# Patient Record
Sex: Female | Born: 1965
Health system: Southern US, Community
[De-identification: ages and names within clinical notes are randomized; demographics above are authoritative.]

## PROBLEM LIST (undated history)

## (undated) DIAGNOSIS — F32A Depression, unspecified: Secondary | ICD-10-CM

## (undated) DIAGNOSIS — E785 Hyperlipidemia, unspecified: Secondary | ICD-10-CM

## (undated) DIAGNOSIS — F329 Major depressive disorder, single episode, unspecified: Secondary | ICD-10-CM

## (undated) DIAGNOSIS — N979 Female infertility, unspecified: Secondary | ICD-10-CM

## (undated) DIAGNOSIS — T7840XA Allergy, unspecified, initial encounter: Secondary | ICD-10-CM

## (undated) DIAGNOSIS — K219 Gastro-esophageal reflux disease without esophagitis: Secondary | ICD-10-CM

## (undated) DIAGNOSIS — F4310 Post-traumatic stress disorder, unspecified: Secondary | ICD-10-CM

## (undated) DIAGNOSIS — G4730 Sleep apnea, unspecified: Secondary | ICD-10-CM

## (undated) DIAGNOSIS — K2970 Gastritis, unspecified, without bleeding: Secondary | ICD-10-CM

## (undated) DIAGNOSIS — T8859XA Other complications of anesthesia, initial encounter: Secondary | ICD-10-CM

## (undated) DIAGNOSIS — E230 Hypopituitarism: Secondary | ICD-10-CM

## (undated) DIAGNOSIS — Z1379 Encounter for other screening for genetic and chromosomal anomalies: Principal | ICD-10-CM

## (undated) DIAGNOSIS — R87619 Unspecified abnormal cytological findings in specimens from cervix uteri: Secondary | ICD-10-CM

## (undated) DIAGNOSIS — E039 Hypothyroidism, unspecified: Secondary | ICD-10-CM

## (undated) DIAGNOSIS — R011 Cardiac murmur, unspecified: Secondary | ICD-10-CM

## (undated) DIAGNOSIS — E162 Hypoglycemia, unspecified: Secondary | ICD-10-CM

## (undated) DIAGNOSIS — F419 Anxiety disorder, unspecified: Secondary | ICD-10-CM

## (undated) DIAGNOSIS — N912 Amenorrhea, unspecified: Secondary | ICD-10-CM

## (undated) DIAGNOSIS — M79606 Pain in leg, unspecified: Secondary | ICD-10-CM

## (undated) DIAGNOSIS — I10 Essential (primary) hypertension: Secondary | ICD-10-CM

## (undated) DIAGNOSIS — Z8489 Family history of other specified conditions: Secondary | ICD-10-CM

## (undated) DIAGNOSIS — T4145XA Adverse effect of unspecified anesthetic, initial encounter: Secondary | ICD-10-CM

## (undated) DIAGNOSIS — B019 Varicella without complication: Secondary | ICD-10-CM

## (undated) HISTORY — DX: Allergy, unspecified, initial encounter: T78.40XA

## (undated) HISTORY — PX: UPPER GASTROINTESTINAL ENDOSCOPY: SHX188

## (undated) HISTORY — PX: OTHER SURGICAL HISTORY: SHX169

## (undated) HISTORY — DX: Hypopituitarism: E23.0

## (undated) HISTORY — DX: Female infertility, unspecified: N97.9

## (undated) HISTORY — DX: Varicella without complication: B01.9

## (undated) HISTORY — DX: Anxiety disorder, unspecified: F41.9

## (undated) HISTORY — DX: Encounter for other screening for genetic and chromosomal anomalies: Z13.79

## (undated) HISTORY — DX: Major depressive disorder, single episode, unspecified: F32.9

## (undated) HISTORY — DX: Gastritis, unspecified, without bleeding: K29.70

## (undated) HISTORY — PX: EYE SURGERY: SHX253

## (undated) HISTORY — DX: Amenorrhea, unspecified: N91.2

## (undated) HISTORY — DX: Post-traumatic stress disorder, unspecified: F43.10

## (undated) HISTORY — DX: Depression, unspecified: F32.A

## (undated) HISTORY — DX: Unspecified abnormal cytological findings in specimens from cervix uteri: R87.619

## (undated) HISTORY — DX: Cardiac murmur, unspecified: R01.1

---

## 1994-05-10 HISTORY — PX: REDUCTION MAMMAPLASTY: SUR839

## 1994-05-10 HISTORY — PX: BREAST REDUCTION SURGERY: SHX8

## 1995-05-11 HISTORY — PX: CHOLECYSTECTOMY: SHX55

## 2011-05-11 HISTORY — PX: ANTERIOR CERVICAL DISCECTOMY: SHX1160

## 2012-04-09 LAB — HM PAP SMEAR

## 2012-09-03 ENCOUNTER — Emergency Department (HOSPITAL_COMMUNITY)
Admission: EM | Admit: 2012-09-03 | Discharge: 2012-09-03 | Disposition: A | Discharge status: Home / Self Care | Payer: No Typology Code available for payment source | Attending: Emergency Medicine | Admitting: Emergency Medicine

## 2012-09-03 ENCOUNTER — Emergency Department (HOSPITAL_COMMUNITY): Payer: No Typology Code available for payment source

## 2012-09-03 ENCOUNTER — Encounter (HOSPITAL_COMMUNITY): Payer: Self-pay | Admitting: Emergency Medicine

## 2012-09-03 DIAGNOSIS — I10 Essential (primary) hypertension: Secondary | ICD-10-CM | POA: Insufficient documentation

## 2012-09-03 DIAGNOSIS — S99919A Unspecified injury of unspecified ankle, initial encounter: Secondary | ICD-10-CM | POA: Insufficient documentation

## 2012-09-03 DIAGNOSIS — Z862 Personal history of diseases of the blood and blood-forming organs and certain disorders involving the immune mechanism: Secondary | ICD-10-CM | POA: Insufficient documentation

## 2012-09-03 DIAGNOSIS — Z8639 Personal history of other endocrine, nutritional and metabolic disease: Secondary | ICD-10-CM | POA: Insufficient documentation

## 2012-09-03 DIAGNOSIS — S8990XA Unspecified injury of unspecified lower leg, initial encounter: Secondary | ICD-10-CM | POA: Insufficient documentation

## 2012-09-03 DIAGNOSIS — Z79899 Other long term (current) drug therapy: Secondary | ICD-10-CM | POA: Insufficient documentation

## 2012-09-03 DIAGNOSIS — E785 Hyperlipidemia, unspecified: Secondary | ICD-10-CM | POA: Insufficient documentation

## 2012-09-03 DIAGNOSIS — Y92410 Unspecified street and highway as the place of occurrence of the external cause: Secondary | ICD-10-CM | POA: Insufficient documentation

## 2012-09-03 DIAGNOSIS — Z880 Allergy status to penicillin: Secondary | ICD-10-CM | POA: Insufficient documentation

## 2012-09-03 DIAGNOSIS — Z8739 Personal history of other diseases of the musculoskeletal system and connective tissue: Secondary | ICD-10-CM | POA: Insufficient documentation

## 2012-09-03 DIAGNOSIS — Y9389 Activity, other specified: Secondary | ICD-10-CM | POA: Insufficient documentation

## 2012-09-03 HISTORY — DX: Hyperlipidemia, unspecified: E78.5

## 2012-09-03 HISTORY — DX: Essential (primary) hypertension: I10

## 2012-09-03 HISTORY — DX: Hypoglycemia, unspecified: E16.2

## 2012-09-03 HISTORY — DX: Pain in leg, unspecified: M79.606

## 2012-09-03 LAB — URINE MICROSCOPIC-ADD ON

## 2012-09-03 LAB — GLUCOSE, CAPILLARY: Glucose-Capillary: 91 mg/dL (ref 70–99)

## 2012-09-03 LAB — URINALYSIS, ROUTINE W REFLEX MICROSCOPIC
Bilirubin Urine: NEGATIVE
Glucose, UA: NEGATIVE mg/dL
Nitrite: NEGATIVE
Specific Gravity, Urine: 1.018 (ref 1.005–1.030)
pH: 6 (ref 5.0–8.0)

## 2012-09-03 LAB — POCT I-STAT, CHEM 8
BUN: 14 mg/dL (ref 6–23)
Chloride: 103 mEq/L (ref 96–112)
Glucose, Bld: 90 mg/dL (ref 70–99)
HCT: 39 % (ref 36.0–46.0)
Potassium: 3.9 mEq/L (ref 3.5–5.1)

## 2012-09-03 MED ORDER — PERCOCET 5-325 MG PO TABS: 1.0000 | ORAL_TABLET | Freq: Four times a day (QID) | ORAL | Status: DC | PRN

## 2012-09-03 MED ORDER — HYDROMORPHONE HCL PF 2 MG/ML IJ SOLN
2.0000 mg | Freq: Once | INTRAMUSCULAR | Status: AC
  Administered 2012-09-03: 2 mg via INTRAMUSCULAR
  Filled 2012-09-03: qty 1

## 2012-09-03 MED ORDER — OXYCODONE-ACETAMINOPHEN 5-325 MG PO TABS
1.0000 | ORAL_TABLET | Freq: Once | ORAL | Status: AC
  Administered 2012-09-03: 1 via ORAL
  Filled 2012-09-03: qty 1

## 2012-09-03 MED ORDER — DIAZEPAM 5 MG PO TABS: 5.0000 mg | ORAL_TABLET | Freq: Four times a day (QID) | ORAL | Status: DC | PRN

## 2012-09-03 MED ORDER — IBUPROFEN 800 MG PO TABS
800.0000 mg | ORAL_TABLET | Freq: Once | ORAL | Status: AC
  Administered 2012-09-03: 800 mg via ORAL
  Filled 2012-09-03: qty 1

## 2012-09-03 MED ORDER — NAPROXEN 500 MG PO TABS: 500.0000 mg | ORAL_TABLET | Freq: Two times a day (BID) | ORAL | Status: DC

## 2012-09-03 MED ORDER — DIAZEPAM 5 MG PO TABS
5.0000 mg | ORAL_TABLET | Freq: Once | ORAL | Status: AC
  Administered 2012-09-03: 5 mg via ORAL
  Filled 2012-09-03: qty 1
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
## 2012-09-03 NOTE — ED Notes (Signed)
Patient transported to X-ray 

## 2012-09-03 NOTE — ED Notes (Signed)
[id number]<AV> Expected date:<BR> Expected time:<BR> Means of arrival:<BR> Comments:<BR> Hold triage
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
## 2012-09-08 ENCOUNTER — Emergency Department (HOSPITAL_COMMUNITY)
Admission: EM | Admit: 2012-09-08 | Discharge: 2012-09-08 | Disposition: A | Discharge status: Home / Self Care | Payer: 59 | Attending: Emergency Medicine | Admitting: Emergency Medicine

## 2012-09-08 ENCOUNTER — Encounter (HOSPITAL_COMMUNITY): Payer: Self-pay | Admitting: Emergency Medicine

## 2012-09-08 ENCOUNTER — Emergency Department (HOSPITAL_COMMUNITY): Payer: 59

## 2012-09-08 DIAGNOSIS — Z880 Allergy status to penicillin: Secondary | ICD-10-CM | POA: Insufficient documentation

## 2012-09-08 DIAGNOSIS — Y92410 Unspecified street and highway as the place of occurrence of the external cause: Secondary | ICD-10-CM | POA: Insufficient documentation

## 2012-09-08 DIAGNOSIS — E785 Hyperlipidemia, unspecified: Secondary | ICD-10-CM | POA: Insufficient documentation

## 2012-09-08 DIAGNOSIS — S301XXA Contusion of abdominal wall, initial encounter: Secondary | ICD-10-CM | POA: Insufficient documentation

## 2012-09-08 DIAGNOSIS — I10 Essential (primary) hypertension: Secondary | ICD-10-CM | POA: Insufficient documentation

## 2012-09-08 DIAGNOSIS — D649 Anemia, unspecified: Secondary | ICD-10-CM | POA: Insufficient documentation

## 2012-09-08 DIAGNOSIS — Z8639 Personal history of other endocrine, nutritional and metabolic disease: Secondary | ICD-10-CM | POA: Insufficient documentation

## 2012-09-08 DIAGNOSIS — R110 Nausea: Secondary | ICD-10-CM | POA: Insufficient documentation

## 2012-09-08 DIAGNOSIS — Z862 Personal history of diseases of the blood and blood-forming organs and certain disorders involving the immune mechanism: Secondary | ICD-10-CM | POA: Insufficient documentation

## 2012-09-08 DIAGNOSIS — Z79899 Other long term (current) drug therapy: Secondary | ICD-10-CM | POA: Insufficient documentation

## 2012-09-08 DIAGNOSIS — R141 Gas pain: Secondary | ICD-10-CM | POA: Insufficient documentation

## 2012-09-08 DIAGNOSIS — R142 Eructation: Secondary | ICD-10-CM | POA: Insufficient documentation

## 2012-09-08 DIAGNOSIS — Y9389 Activity, other specified: Secondary | ICD-10-CM | POA: Insufficient documentation

## 2012-09-08 LAB — CBC
MCH: 29.4 pg (ref 26.0–34.0)
MCHC: 33.8 g/dL (ref 30.0–36.0)
Platelets: 245 10*3/uL (ref 150–400)
RBC: 3.64 MIL/uL — ABNORMAL LOW (ref 3.87–5.11)

## 2012-09-08 LAB — POCT I-STAT, CHEM 8
Hemoglobin: 10.9 g/dL — ABNORMAL LOW (ref 12.0–15.0)
Sodium: 136 mEq/L (ref 135–145)
TCO2: 27 mmol/L (ref 0–100)

## 2012-09-08 MED ORDER — IOHEXOL 300 MG/ML  SOLN
100.0000 mL | Freq: Once | INTRAMUSCULAR | Status: AC | PRN
  Administered 2012-09-08: 100 mL via INTRAVENOUS
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
## 2012-09-10 NOTE — ED Provider Notes (Signed)
Medical screening examination/treatment/procedure(s) were performed by non-physician practitioner and as supervising physician I was immediately available for consultation/collaboration.   Jil Penland, MD 09/10/12 1542 

## 2013-01-12 ENCOUNTER — Ambulatory Visit: Payer: 59 | Admitting: Internal Medicine

## 2013-04-06 ENCOUNTER — Emergency Department (HOSPITAL_COMMUNITY)
Admission: EM | Admit: 2013-04-06 | Discharge: 2013-04-06 | Disposition: A | Discharge status: Home / Self Care | Payer: 59 | Source: Home / Self Care

## 2013-04-06 ENCOUNTER — Encounter (HOSPITAL_COMMUNITY): Payer: Self-pay | Admitting: Emergency Medicine

## 2013-04-06 DIAGNOSIS — B029 Zoster without complications: Secondary | ICD-10-CM

## 2013-04-06 MED ORDER — VALACYCLOVIR HCL 1 G PO TABS: 1000.0000 mg | ORAL_TABLET | Freq: Three times a day (TID) | ORAL | Status: DC

## 2013-04-06 MED ORDER — LIDOCAINE 5 % EX PTCH: 1.0000 | MEDICATED_PATCH | CUTANEOUS | Status: DC

## 2013-04-06 MED ORDER — HYDROCODONE-ACETAMINOPHEN 5-325 MG PO TABS: 1.0000 | ORAL_TABLET | ORAL | Status: DC | PRN

## 2013-04-06 MED ORDER — METHYLPREDNISOLONE 4 MG PO KIT: PACK | ORAL | Status: DC

## 2013-04-06 NOTE — ED Provider Notes (Signed)
Medical screening examination/treatment/procedure(s) were performed by non-physician practitioner and as supervising physician I was immediately available for consultation/collaboration.  Leslee Home, M.D.  Reuben Likes, MD 04/06/13 2037

## 2013-04-06 NOTE — ED Notes (Signed)
C/o painful rash on back and at bra line.  Onset Tuesday.  No otc meds used for symptoms. Denies fever and any other symptoms.

## 2013-04-06 NOTE — ED Provider Notes (Signed)
CSN: [id number]     Arrival date & time 04/06/13  1240 History   First MD Initiated Contact with Patient 04/06/13 1359     Chief Complaint  Patient presents with  . Herpes Zoster   (Consider location/radiation/quality/duration/timing/severity/associated sxs/prior Treatment) HPI Comments: 47 year old female presents with a complaint of a painful burning rash in the left chest along the mid axillary line as well as a rash just left of the thoracic spine. It began approximately 3 days ago but has been getting worse in terms of discomfort. Denies anterior chest pain, shortness of breath or other systemic symptoms. The rash and skin tenderness is located along the left T7 or T8 dermatome.   Past Medical History  Diagnosis Date  . Hypertension   . Hypoglycemia   . Hyperlipemia   . Leg pain    Past Surgical History  Procedure Laterality Date  . Eye surgery    . Cholecystectomy    . Cesarean section    . Breast reduction surgery    . Anterior cervical discectomy     Family History  Problem Relation Age of Onset  . Osteoarthritis Mother   . Hypertension Father    History  Substance Use Topics  . Smoking status: Never Smoker   . Smokeless tobacco: Not on file  . Alcohol Use: No   OB History   Grav Para Term Preterm Abortions TAB SAB Ect Mult Living                 Review of Systems  Skin:       As per history of present illness  All other systems reviewed and are negative.    Allergies  Penicillins  Home Medications   Current Outpatient Rx  Name  Route  Sig  Dispense  Refill  . atorvastatin (LIPITOR) 20 MG tablet   Oral   Take 20 mg by mouth daily.         Marland Kitchen buPROPion (WELLBUTRIN SR) 150 MG 12 hr tablet   Oral   Take 150 mg by mouth 2 (two) times daily.         Marland Kitchen levothyroxine (SYNTHROID, LEVOTHROID) 75 MCG tablet   Oral   Take 75 mcg by mouth daily before breakfast.         . lisinopril (PRINIVIL,ZESTRIL) 20 MG tablet   Oral   Take 20 mg by mouth  daily.         . Multiple Vitamin (MULTIVITAMIN WITH MINERALS) TABS   Oral   Take 1 tablet by mouth daily.         . pramipexole (MIRAPEX) 0.5 MG tablet   Oral   Take 0.5 mg by mouth at bedtime.         . sertraline (ZOLOFT) 100 MG tablet   Oral   Take 200 mg by mouth daily.          Marland Kitchen acetaminophen (TYLENOL) 500 MG tablet   Oral   Take 1,000 mg by mouth every 6 (six) hours as needed for pain.         . Coenzyme Q10 (CO Q-10) 100 MG CAPS   Oral   Take 1 capsule by mouth daily.         Marland Kitchen HYDROcodone-acetaminophen (NORCO/VICODIN) 5-325 MG per tablet   Oral   Take 1 tablet by mouth every 4 (four) hours as needed.   15 tablet   0   . lidocaine (LIDODERM) 5 %   Transdermal   Place  1 patch onto the skin daily. Remove & Discard patch within 12 hours or as directed by MD   6 patch   0   . methylPREDNISolone (MEDROL DOSEPAK) 4 MG tablet      follow package directions   21 tablet   0   . naproxen (NAPROSYN) 500 MG tablet   Oral   Take 1 tablet (500 mg total) by mouth 2 (two) times daily.   30 tablet   0   . PERCOCET 5-325 MG per tablet   Oral   Take 1 tablet by mouth every 6 (six) hours as needed for pain.   15 tablet   0     Dispense as written.   . valACYclovir (VALTREX) 1000 MG tablet   Oral   Take 1 tablet (1,000 mg total) by mouth 3 (three) times daily. X 7 days   21 tablet   0    BP 123/68  Pulse 70  Temp(Src) 97.4 F (36.3 C)  Resp 12  SpO2 100% Physical Exam  Nursing note and vitals reviewed. Constitutional: She is oriented to person, place, and time. She appears well-developed and well-nourished. No distress.  Cardiovascular: Normal rate.   Pulmonary/Chest: Effort normal. No respiratory distress.  Musculoskeletal: Normal range of motion. She exhibits no edema.  Neurological: She is alert and oriented to person, place, and time. No cranial nerve deficit. She exhibits normal muscle tone.  Skin: Skin is warm and dry.  There is a crop  of papules located at the left midaxillary line measuring approximately 3 x 4 cm. Following a line along the seventh or eighth dermatome to the spine there is a similar lesion of similar size located left of the spine. Between these 2 lesions there is a strip of skin with burning and tenderness without visible lesions.  Psychiatric: She has a normal mood and affect.    ED Course  Procedures (including critical care time) Labs Review Labs Reviewed - No data to display Imaging Review No results found.    MDM   1. Herpes zoster    Valacyclovir 1000 mg 3 times a day for 7 days Medrol Dosepak Lidoderm patches Norco 5 mg #15.    Hayden Rasmussen, NP 04/06/13 [phone]

## 2013-04-17 ENCOUNTER — Emergency Department (HOSPITAL_COMMUNITY)
Admission: EM | Admit: 2013-04-17 | Discharge: 2013-04-17 | Disposition: A | Discharge status: Home / Self Care | Payer: 59 | Source: Home / Self Care

## 2013-04-17 ENCOUNTER — Encounter (HOSPITAL_COMMUNITY): Payer: Self-pay | Admitting: Emergency Medicine

## 2013-04-17 DIAGNOSIS — J329 Chronic sinusitis, unspecified: Secondary | ICD-10-CM

## 2013-04-17 DIAGNOSIS — J310 Chronic rhinitis: Secondary | ICD-10-CM

## 2013-04-17 DIAGNOSIS — N390 Urinary tract infection, site not specified: Secondary | ICD-10-CM

## 2013-04-17 DIAGNOSIS — J069 Acute upper respiratory infection, unspecified: Secondary | ICD-10-CM

## 2013-04-17 LAB — POCT URINALYSIS DIP (DEVICE)
Bilirubin Urine: NEGATIVE
Glucose, UA: NEGATIVE mg/dL
Nitrite: NEGATIVE
Specific Gravity, Urine: 1.01 (ref 1.005–1.030)
Urobilinogen, UA: 0.2 mg/dL (ref 0.0–1.0)
pH: 6.5 (ref 5.0–8.0)

## 2013-04-17 MED ORDER — FLUCONAZOLE 150 MG PO TABS: ORAL_TABLET | ORAL | Status: DC

## 2013-04-17 MED ORDER — CIPROFLOXACIN HCL 500 MG PO TABS: 500.0000 mg | ORAL_TABLET | Freq: Two times a day (BID) | ORAL | Status: DC

## 2013-04-17 NOTE — ED Provider Notes (Signed)
CSN: [id number]     Arrival date & time 04/17/13  1050 History   First MD Initiated Contact with Patient 04/17/13 1120     Chief Complaint  Patient presents with  . Urinary Tract Infection  . URI   (Consider location/radiation/quality/duration/timing/severity/associated sxs/prior Treatment) HPI Comments: 47 year old female states that she developed cold symptoms proximally week ago. This was associated with a subjective feeling of fever, runny nose, cough and malaise. She improved with the symptoms but now has PND, nasal congestion and sore throat.  Second complaint is that of urinary urgency and dysuria starting 2 days ago.     Past Medical History  Diagnosis Date  . Hypertension   . Hypoglycemia   . Hyperlipemia   . Leg pain    Past Surgical History  Procedure Laterality Date  . Eye surgery    . Cholecystectomy    . Cesarean section    . Breast reduction surgery    . Anterior cervical discectomy     Family History  Problem Relation Age of Onset  . Osteoarthritis Mother   . Hypertension Father    History  Substance Use Topics  . Smoking status: Never Smoker   . Smokeless tobacco: Not on file  . Alcohol Use: No   OB History   Grav Para Term Preterm Abortions TAB SAB Ect Mult Living                 Review of Systems  Constitutional: Negative for fever, chills, activity change, appetite change and fatigue.  HENT: Positive for congestion, postnasal drip and rhinorrhea. Negative for ear pain and facial swelling.   Eyes: Negative.   Respiratory: Positive for cough. Negative for shortness of breath and wheezing.   Cardiovascular: Negative.   Gastrointestinal: Negative.   Genitourinary: Positive for dysuria and urgency. Negative for flank pain.  Musculoskeletal: Negative.  Negative for neck pain and neck stiffness.  Skin: Negative for pallor and rash.  Neurological: Negative.     Allergies  Penicillins  Home Medications   Current Outpatient Rx  Name  Route   Sig  Dispense  Refill  . acetaminophen (TYLENOL) 500 MG tablet   Oral   Take 1,000 mg by mouth every 6 (six) hours as needed for pain.         Marland Kitchen atorvastatin (LIPITOR) 20 MG tablet   Oral   Take 20 mg by mouth daily.         Marland Kitchen buPROPion (WELLBUTRIN SR) 150 MG 12 hr tablet   Oral   Take 150 mg by mouth 2 (two) times daily.         . Coenzyme Q10 (CO Q-10) 100 MG CAPS   Oral   Take 1 capsule by mouth daily.         Marland Kitchen HYDROcodone-acetaminophen (NORCO/VICODIN) 5-325 MG per tablet   Oral   Take 1 tablet by mouth every 4 (four) hours as needed.   15 tablet   0   . levothyroxine (SYNTHROID, LEVOTHROID) 75 MCG tablet   Oral   Take 75 mcg by mouth daily before breakfast.         . lidocaine (LIDODERM) 5 %   Transdermal   Place 1 patch onto the skin daily. Remove & Discard patch within 12 hours or as directed by MD   6 patch   0   . lisinopril (PRINIVIL,ZESTRIL) 20 MG tablet   Oral   Take 20 mg by mouth daily.         Marland Kitchen  methylPREDNISolone (MEDROL DOSEPAK) 4 MG tablet      follow package directions   21 tablet   0   . Multiple Vitamin (MULTIVITAMIN WITH MINERALS) TABS   Oral   Take 1 tablet by mouth daily.         . naproxen (NAPROSYN) 500 MG tablet   Oral   Take 1 tablet (500 mg total) by mouth 2 (two) times daily.   30 tablet   0   . PERCOCET 5-325 MG per tablet   Oral   Take 1 tablet by mouth every 6 (six) hours as needed for pain.   15 tablet   0     Dispense as written.   . pramipexole (MIRAPEX) 0.5 MG tablet   Oral   Take 0.5 mg by mouth at bedtime.         . sertraline (ZOLOFT) 100 MG tablet   Oral   Take 200 mg by mouth daily.          . valACYclovir (VALTREX) 1000 MG tablet   Oral   Take 1 tablet (1,000 mg total) by mouth 3 (three) times daily. X 7 days   21 tablet   0    BP 124/82  Pulse 80  Temp(Src) 98 F (36.7 C) (Oral)  Resp 18  SpO2 99% Physical Exam  Nursing note and vitals reviewed. Constitutional: She is  oriented to person, place, and time. She appears well-developed and well-nourished. No distress.  HENT:  Mouth/Throat: No oropharyngeal exudate.  Bilateral TMs are normal Oropharynx with mild erythema and red particles streaks. No swelling or exudates.  Eyes: Conjunctivae and EOM are normal.  Neck: Normal range of motion. Neck supple.  Cardiovascular: Normal rate, regular rhythm and normal heart sounds.   Pulmonary/Chest: Effort normal and breath sounds normal. No respiratory distress. She has no wheezes. She has no rales.  Musculoskeletal: Normal range of motion. She exhibits no edema.  Lymphadenopathy:    She has no cervical adenopathy.  Neurological: She is alert and oriented to person, place, and time.  Skin: Skin is warm and dry. No rash noted.  Psychiatric: She has a normal mood and affect.    ED Course  Procedures (including critical care time) Labs Review Labs Reviewed  POCT URINALYSIS DIP (DEVICE) - Abnormal; Notable for the following:    Hgb urine dipstick TRACE (*)    Leukocytes, UA SMALL (*)    All other components within normal limits  POCT RAPID STREP A ([facility] ONLY)   Imaging Review No results found. Results for orders placed during the hospital encounter of 04/17/13  POCT URINALYSIS DIP (DEVICE)      Result Value Range   Glucose, UA NEGATIVE  NEGATIVE mg/dL   Bilirubin Urine NEGATIVE  NEGATIVE   Ketones, ur NEGATIVE  NEGATIVE mg/dL   Specific Gravity, Urine 1.010  1.005 - 1.030   Hgb urine dipstick TRACE (*) NEGATIVE   pH 6.5  5.0 - 8.0   Protein, ur NEGATIVE  NEGATIVE mg/dL   Urobilinogen, UA 0.2  0.0 - 1.0 mg/dL   Nitrite NEGATIVE  NEGATIVE   Leukocytes, UA SMALL (*) NEGATIVE  POCT RAPID STREP A ([facility] ONLY)      Result Value Range   Streptococcus, Group A Screen (Direct) NEGATIVE  NEGATIVE      MDM   1. URI (upper respiratory infection)   2. Rhinosinusitis   3. UTI (lower urinary tract infection)       Alka  Seltzer Cold Plus and  Robitussin Dm May sub Allegra 180 mg for nondrowsy relief and hold Alka Seltzer Fluids Cipro for UTI Diflucan tabs 2 as needed.   Hayden Rasmussen, NP 04/17/13 1157

## 2013-04-17 NOTE — ED Notes (Signed)
C/o possible UTI that started on Sunday. Patient has sore throat, cough and congestion that has been going on since last Wednesday. Took Tessalon Pearls and OTC medications.Sharlene Motts

## 2013-04-18 NOTE — ED Provider Notes (Signed)
Medical screening examination/treatment/procedure(s) were performed by a resident physician or non-physician practitioner and as the supervising physician I was immediately available for consultation/collaboration.  Jolleen Seman, MD    Eliana Lueth S Demetre Monaco, MD 04/18/13 0803 

## 2013-04-19 LAB — CULTURE, GROUP A STREP

## 2013-04-19 LAB — URINE CULTURE
Colony Count: >=100000
Special Requests: NORMAL

## 2013-04-19 NOTE — ED Notes (Addendum)
Urine culture: >100,000 colonies E. Coli.  Pt. adequately treated with Cipro.  Throat culture: pending. Vassie Moselle 04/19/2013 Throat culture: No beta hemolytic strep isolated. 04/20/2013

## 2013-04-23 ENCOUNTER — Ambulatory Visit: Payer: 59 | Admitting: Family Medicine

## 2013-05-18 ENCOUNTER — Ambulatory Visit: Payer: 59 | Admitting: Internal Medicine

## 2013-05-18 ENCOUNTER — Ambulatory Visit (INDEPENDENT_AMBULATORY_CARE_PROVIDER_SITE_OTHER): Payer: Self-pay | Admitting: Surgery

## 2013-05-24 ENCOUNTER — Ambulatory Visit (INDEPENDENT_AMBULATORY_CARE_PROVIDER_SITE_OTHER): Payer: Self-pay | Admitting: Surgery

## 2013-07-20 ENCOUNTER — Telehealth: Payer: Self-pay

## 2013-07-20 NOTE — Telephone Encounter (Signed)
Unable to reach patient with number listed.    NEW PATIENT.

## 2013-07-23 ENCOUNTER — Encounter: Payer: Self-pay | Admitting: Family Medicine

## 2013-07-23 ENCOUNTER — Ambulatory Visit (INDEPENDENT_AMBULATORY_CARE_PROVIDER_SITE_OTHER): Payer: 59 | Admitting: Family Medicine

## 2013-07-23 VITALS — BP 112/66 | HR 62 | Temp 98.0°F | Ht <= 58 in | Wt 170.0 lb

## 2013-07-23 DIAGNOSIS — E785 Hyperlipidemia, unspecified: Secondary | ICD-10-CM

## 2013-07-23 DIAGNOSIS — F4310 Post-traumatic stress disorder, unspecified: Secondary | ICD-10-CM | POA: Insufficient documentation

## 2013-07-23 DIAGNOSIS — M6740 Ganglion, unspecified site: Secondary | ICD-10-CM | POA: Insufficient documentation

## 2013-07-23 DIAGNOSIS — E669 Obesity, unspecified: Secondary | ICD-10-CM | POA: Insufficient documentation

## 2013-07-23 DIAGNOSIS — M949 Disorder of cartilage, unspecified: Secondary | ICD-10-CM

## 2013-07-23 DIAGNOSIS — M899 Disorder of bone, unspecified: Secondary | ICD-10-CM

## 2013-07-23 DIAGNOSIS — E230 Hypopituitarism: Secondary | ICD-10-CM

## 2013-07-23 DIAGNOSIS — M8580 Other specified disorders of bone density and structure, unspecified site: Secondary | ICD-10-CM

## 2013-07-23 DIAGNOSIS — E039 Hypothyroidism, unspecified: Secondary | ICD-10-CM

## 2013-07-23 DIAGNOSIS — I10 Essential (primary) hypertension: Secondary | ICD-10-CM

## 2013-07-23 DIAGNOSIS — Z1239 Encounter for other screening for malignant neoplasm of breast: Secondary | ICD-10-CM

## 2013-07-23 LAB — CBC WITH DIFFERENTIAL/PLATELET
BASOS PCT: 0.5 % (ref 0.0–3.0)
Basophils Absolute: 0 10*3/uL (ref 0.0–0.1)
EOS ABS: 0.1 10*3/uL (ref 0.0–0.7)
EOS PCT: 1.7 % (ref 0.0–5.0)
HEMATOCRIT: 38.3 % (ref 36.0–46.0)
Hemoglobin: 12.7 g/dL (ref 12.0–15.0)
LYMPHS ABS: 1.6 10*3/uL (ref 0.7–4.0)
Lymphocytes Relative: 23.1 % (ref 12.0–46.0)
MCHC: 33.1 g/dL (ref 30.0–36.0)
MCV: 87.4 fl (ref 78.0–100.0)
MONO ABS: 0.5 10*3/uL (ref 0.1–1.0)
Monocytes Relative: 6.8 % (ref 3.0–12.0)
Neutro Abs: 4.6 10*3/uL (ref 1.4–7.7)
Neutrophils Relative %: 67.9 % (ref 43.0–77.0)
PLATELETS: 232 10*3/uL (ref 150.0–400.0)
RBC: 4.39 Mil/uL (ref 3.87–5.11)
RDW: 14.3 % (ref 11.5–14.6)
WBC: 6.8 10*3/uL (ref 4.5–10.5)

## 2013-07-23 LAB — POCT URINALYSIS DIPSTICK
BILIRUBIN UA: NEGATIVE
Glucose, UA: NEGATIVE
KETONES UA: NEGATIVE
Leukocytes, UA: NEGATIVE
Nitrite, UA: NEGATIVE
Protein, UA: NEGATIVE
RBC UA: NEGATIVE
Urobilinogen, UA: 0.2
pH, UA: 6

## 2013-07-23 LAB — BASIC METABOLIC PANEL
BUN: 18 mg/dL (ref 6–23)
CHLORIDE: 105 meq/L (ref 96–112)
CO2: 27 mEq/L (ref 19–32)
Calcium: 9.5 mg/dL (ref 8.4–10.5)
Creatinine, Ser: 0.9 mg/dL (ref 0.4–1.2)
GFR: 67.36 mL/min (ref >60.00)
Glucose, Bld: 86 mg/dL (ref 70–99)
POTASSIUM: 4 meq/L (ref 3.5–5.1)
Sodium: 138 mEq/L (ref 135–145)

## 2013-07-23 LAB — T4, FREE: FREE T4: 0.61 ng/dL (ref 0.60–1.60)

## 2013-07-23 LAB — T3, FREE: T3 FREE: 2.3 pg/mL (ref 2.3–4.2)

## 2013-07-23 LAB — HEPATIC FUNCTION PANEL
ALT: 36 U/L — ABNORMAL HIGH (ref 0–35)
AST: 35 U/L (ref 0–37)
Albumin: 4.2 g/dL (ref 3.5–5.2)
Alkaline Phosphatase: 68 U/L (ref 39–117)
Bilirubin, Direct: 0 mg/dL (ref 0.0–0.3)
TOTAL PROTEIN: 7.3 g/dL (ref 6.0–8.3)
Total Bilirubin: 0.3 mg/dL (ref 0.3–1.2)

## 2013-07-23 LAB — LIPID PANEL
CHOLESTEROL: 131 mg/dL (ref 0–200)
HDL: 59.3 mg/dL (ref >39.00)
LDL Cholesterol: 51 mg/dL (ref 0–99)
TRIGLYCERIDES: 106 mg/dL (ref 0.0–149.0)
Total CHOL/HDL Ratio: 2
VLDL: 21.2 mg/dL (ref 0.0–40.0)

## 2013-07-23 LAB — TSH: TSH: 0.9 u[IU]/mL (ref 0.35–5.50)

## 2013-07-23 NOTE — Progress Notes (Signed)
Pre visit review using our clinic review tool, if applicable. No additional management support is needed unless otherwise documented below in the visit note. 

## 2013-07-23 NOTE — Patient Instructions (Signed)
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
## 2013-07-23 NOTE — Assessment & Plan Note (Signed)
Pt prefers to hold off on seeing ortho

## 2013-07-24 ENCOUNTER — Telehealth: Payer: Self-pay | Admitting: Family Medicine

## 2013-07-24 NOTE — Telephone Encounter (Signed)
Relevant patient education assigned to patient using Emmi. ° °

## 2013-07-25 NOTE — Telephone Encounter (Signed)
Unable to reach pre visit.  

## 2013-09-10 ENCOUNTER — Encounter: Payer: 59 | Admitting: Family Medicine

## 2013-11-25 ENCOUNTER — Encounter (HOSPITAL_COMMUNITY): Payer: Self-pay | Admitting: Emergency Medicine

## 2013-11-25 ENCOUNTER — Emergency Department (HOSPITAL_COMMUNITY): Payer: 59

## 2013-11-25 ENCOUNTER — Emergency Department (HOSPITAL_COMMUNITY)
Admission: EM | Admit: 2013-11-25 | Discharge: 2013-11-26 | Disposition: A | Discharge status: Home / Self Care | Payer: 59 | Attending: Emergency Medicine | Admitting: Emergency Medicine

## 2013-11-25 DIAGNOSIS — Z862 Personal history of diseases of the blood and blood-forming organs and certain disorders involving the immune mechanism: Secondary | ICD-10-CM | POA: Insufficient documentation

## 2013-11-25 DIAGNOSIS — E785 Hyperlipidemia, unspecified: Secondary | ICD-10-CM | POA: Insufficient documentation

## 2013-11-25 DIAGNOSIS — R011 Cardiac murmur, unspecified: Secondary | ICD-10-CM | POA: Insufficient documentation

## 2013-11-25 DIAGNOSIS — Z79899 Other long term (current) drug therapy: Secondary | ICD-10-CM | POA: Insufficient documentation

## 2013-11-25 DIAGNOSIS — Z792 Long term (current) use of antibiotics: Secondary | ICD-10-CM | POA: Insufficient documentation

## 2013-11-25 DIAGNOSIS — S01112A Laceration without foreign body of left eyelid and periocular area, initial encounter: Secondary | ICD-10-CM

## 2013-11-25 DIAGNOSIS — IMO0002 Reserved for concepts with insufficient information to code with codable children: Secondary | ICD-10-CM | POA: Insufficient documentation

## 2013-11-25 DIAGNOSIS — Z880 Allergy status to penicillin: Secondary | ICD-10-CM | POA: Insufficient documentation

## 2013-11-25 DIAGNOSIS — F4310 Post-traumatic stress disorder, unspecified: Secondary | ICD-10-CM | POA: Insufficient documentation

## 2013-11-25 DIAGNOSIS — R296 Repeated falls: Secondary | ICD-10-CM | POA: Insufficient documentation

## 2013-11-25 DIAGNOSIS — Z8619 Personal history of other infectious and parasitic diseases: Secondary | ICD-10-CM | POA: Insufficient documentation

## 2013-11-25 DIAGNOSIS — W19XXXA Unspecified fall, initial encounter: Secondary | ICD-10-CM

## 2013-11-25 DIAGNOSIS — Y9229 Other specified public building as the place of occurrence of the external cause: Secondary | ICD-10-CM | POA: Insufficient documentation

## 2013-11-25 DIAGNOSIS — H519 Unspecified disorder of binocular movement: Secondary | ICD-10-CM | POA: Insufficient documentation

## 2013-11-25 DIAGNOSIS — Z23 Encounter for immunization: Secondary | ICD-10-CM | POA: Insufficient documentation

## 2013-11-25 DIAGNOSIS — Y9301 Activity, walking, marching and hiking: Secondary | ICD-10-CM | POA: Insufficient documentation

## 2013-11-25 DIAGNOSIS — I10 Essential (primary) hypertension: Secondary | ICD-10-CM | POA: Insufficient documentation

## 2013-11-25 DIAGNOSIS — Z8639 Personal history of other endocrine, nutritional and metabolic disease: Secondary | ICD-10-CM | POA: Insufficient documentation

## 2013-11-25 DIAGNOSIS — F3289 Other specified depressive episodes: Secondary | ICD-10-CM | POA: Insufficient documentation

## 2013-11-25 DIAGNOSIS — F329 Major depressive disorder, single episode, unspecified: Secondary | ICD-10-CM | POA: Insufficient documentation

## 2013-11-25 DIAGNOSIS — S0180XA Unspecified open wound of other part of head, initial encounter: Secondary | ICD-10-CM | POA: Insufficient documentation

## 2013-11-25 MED ORDER — TETANUS-DIPHTH-ACELL PERTUSSIS 5-2.5-18.5 LF-MCG/0.5 IM SUSP
0.5000 mL | Freq: Once | INTRAMUSCULAR | Status: AC
  Administered 2013-11-25: 0.5 mL via INTRAMUSCULAR
  Filled 2013-11-25: qty 0.5

## 2013-11-25 MED ORDER — IBUPROFEN 800 MG PO TABS
800.0000 mg | ORAL_TABLET | Freq: Once | ORAL | Status: AC
  Administered 2013-11-25: 800 mg via ORAL
  Filled 2013-11-25: qty 1

## 2013-11-25 NOTE — ED Notes (Signed)
Pt states she was going to the pool and was walking on uneven pavers and fell  Pt has a laceration above her left eye  Bleeding controlled  Pt states she feels a little weak

## 2013-11-25 NOTE — Discharge Instructions (Signed)
Your CAT scan did not show any fractures or other concerning injuries to your face or head from your fall. Your wound was cleaned and closed with sutures. Having your sutures removed in 5-7 days.    Facial Laceration  A facial laceration is a cut on the face. These injuries can be painful and cause bleeding. Lacerations usually heal quickly, but they need special care to reduce scarring. DIAGNOSIS  Your health care provider will take a medical history, ask for details about how the injury occurred, and examine the wound to determine how deep the cut is. TREATMENT  Some facial lacerations may not require closure. Others may not be able to be closed because of an increased risk of infection. The risk of infection and the chance for successful closure will depend on various factors, including the amount of time since the injury occurred. The wound may be cleaned to help prevent infection. If closure is appropriate, pain medicines may be given if needed. Your health care provider will use stitches (sutures), wound glue (adhesive), or skin adhesive strips to repair the laceration. These tools bring the skin edges together to allow for faster healing and a better cosmetic outcome. If needed, you may also be given a tetanus shot. HOME CARE INSTRUCTIONS  Only take over-the-counter or prescription medicines as directed by your health care provider.  Follow your health care provider's instructions for wound care. These instructions will vary depending on the technique used for closing the wound. For Sutures:  Keep the wound clean and dry.   If you were given a bandage (dressing), you should change it at least once a day. Also change the dressing if it becomes wet or dirty, or as directed by your health care provider.   Wash the wound with soap and water 2 times a day. Rinse the wound off with water to remove all soap. Pat the wound dry with a clean towel.   After cleaning, apply a thin layer of the  antibiotic ointment recommended by your health care provider. This will help prevent infection and keep the dressing from sticking.   You may shower as usual after the first 24 hours. Do not soak the wound in water until the sutures are removed.   Get your sutures removed as directed by your health care provider. With facial lacerations, sutures should usually be taken out after 4-5 days to avoid stitch marks.   Wait a few days after your sutures are removed before applying any makeup. For Skin Adhesive Strips:  Keep the wound clean and dry.   Do not get the skin adhesive strips wet. You may bathe carefully, using caution to keep the wound dry.   If the wound gets wet, pat it dry with a clean towel.   Skin adhesive strips will fall off on their own. You may trim the strips as the wound heals. Do not remove skin adhesive strips that are still stuck to the wound. They will fall off in time.  For Wound Adhesive:  You may briefly wet your wound in the shower or bath. Do not soak or scrub the wound. Do not swim. Avoid periods of heavy sweating until the skin adhesive has fallen off on its own. After showering or bathing, gently pat the wound dry with a clean towel.   Do not apply liquid medicine, cream medicine, ointment medicine, or makeup to your wound while the skin adhesive is in place. This may loosen the film before your wound is healed.  If a dressing is placed over the wound, be careful not to apply tape directly over the skin adhesive. This may cause the adhesive to be pulled off before the wound is healed.   Avoid prolonged exposure to sunlight or tanning lamps while the skin adhesive is in place.  The skin adhesive will usually remain in place for 5-10 days, then naturally fall off the skin. Do not pick at the adhesive film.  After Healing: Once the wound has healed, cover the wound with sunscreen during the day for 1 full year. This can help minimize scarring. Exposure  to ultraviolet light in the first year will darken the scar. It can take 1-2 years for the scar to lose its redness and to heal completely.  SEEK IMMEDIATE MEDICAL CARE IF:  You have redness, pain, or swelling around the wound.   You see ayellowish-white fluid (pus) coming from the wound.   You have chills or a fever.  MAKE SURE YOU:  Understand these instructions.  Will watch your condition.  Will get help right away if you are not doing well or get worse. Document Released: 06/03/2004 Document Revised: 02/14/2013 Document Reviewed: 12/07/2012 [facility] Patient Information 2015 [employer], [place]. This information is not intended to replace advice given to you by your health care provider. Make sure you discuss any questions you have with your health care provider.

## 2013-11-25 NOTE — ED Notes (Signed)
Report received from previous RN, Dow ChemicalMatt
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
## 2013-11-25 NOTE — ED Notes (Signed)
Notified that pt is ready for scan per Dr. Orson Slickammen.

## 2013-11-27 ENCOUNTER — Encounter (HOSPITAL_COMMUNITY): Payer: Self-pay | Admitting: Emergency Medicine

## 2013-11-27 ENCOUNTER — Emergency Department (HOSPITAL_COMMUNITY): Payer: 59

## 2013-11-27 ENCOUNTER — Inpatient Hospital Stay (HOSPITAL_COMMUNITY)
Admission: EM | Admit: 2013-11-27 | Discharge: 2013-11-29 | DRG: 640 | Disposition: A | Discharge status: Home / Self Care | Payer: 59 | Attending: Family Medicine | Admitting: Family Medicine

## 2013-11-27 DIAGNOSIS — G9340 Encephalopathy, unspecified: Secondary | ICD-10-CM

## 2013-11-27 DIAGNOSIS — Z803 Family history of malignant neoplasm of breast: Secondary | ICD-10-CM

## 2013-11-27 DIAGNOSIS — M6740 Ganglion, unspecified site: Secondary | ICD-10-CM

## 2013-11-27 DIAGNOSIS — Z9181 History of falling: Secondary | ICD-10-CM

## 2013-11-27 DIAGNOSIS — Z981 Arthrodesis status: Secondary | ICD-10-CM

## 2013-11-27 DIAGNOSIS — E230 Hypopituitarism: Secondary | ICD-10-CM

## 2013-11-27 DIAGNOSIS — Z8659 Personal history of other mental and behavioral disorders: Secondary | ICD-10-CM

## 2013-11-27 DIAGNOSIS — Z808 Family history of malignant neoplasm of other organs or systems: Secondary | ICD-10-CM

## 2013-11-27 DIAGNOSIS — E669 Obesity, unspecified: Secondary | ICD-10-CM

## 2013-11-27 DIAGNOSIS — I10 Essential (primary) hypertension: Secondary | ICD-10-CM

## 2013-11-27 DIAGNOSIS — F4310 Post-traumatic stress disorder, unspecified: Secondary | ICD-10-CM

## 2013-11-27 DIAGNOSIS — Z801 Family history of malignant neoplasm of trachea, bronchus and lung: Secondary | ICD-10-CM

## 2013-11-27 DIAGNOSIS — Z8249 Family history of ischemic heart disease and other diseases of the circulatory system: Secondary | ICD-10-CM

## 2013-11-27 DIAGNOSIS — Z823 Family history of stroke: Secondary | ICD-10-CM

## 2013-11-27 DIAGNOSIS — Z8041 Family history of malignant neoplasm of ovary: Secondary | ICD-10-CM

## 2013-11-27 DIAGNOSIS — E039 Hypothyroidism, unspecified: Secondary | ICD-10-CM

## 2013-11-27 DIAGNOSIS — E871 Hypo-osmolality and hyponatremia: Principal | ICD-10-CM

## 2013-11-27 DIAGNOSIS — E785 Hyperlipidemia, unspecified: Secondary | ICD-10-CM

## 2013-11-27 DIAGNOSIS — F319 Bipolar disorder, unspecified: Secondary | ICD-10-CM

## 2013-11-27 LAB — CBC WITH DIFFERENTIAL/PLATELET
BASOS PCT: 0 % (ref 0–1)
Basophils Absolute: 0 10*3/uL (ref 0.0–0.1)
Eosinophils Absolute: 0.1 10*3/uL (ref 0.0–0.7)
Eosinophils Relative: 1 % (ref 0–5)
HCT: 34.1 % — ABNORMAL LOW (ref 36.0–46.0)
Hemoglobin: 12.1 g/dL (ref 12.0–15.0)
LYMPHS ABS: 1.1 10*3/uL (ref 0.7–4.0)
Lymphocytes Relative: 15 % (ref 12–46)
MCH: 29.1 pg (ref 26.0–34.0)
MCHC: 35.5 g/dL (ref 30.0–36.0)
MCV: 82 fL (ref 78.0–100.0)
Monocytes Absolute: 0.5 10*3/uL (ref 0.1–1.0)
Monocytes Relative: 7 % (ref 3–12)
NEUTROS ABS: 5.7 10*3/uL (ref 1.7–7.7)
NEUTROS PCT: 77 % (ref 43–77)
Platelets: 225 10*3/uL (ref 150–400)
RBC: 4.16 MIL/uL (ref 3.87–5.11)
RDW: 12.7 % (ref 11.5–15.5)
WBC: 7.4 10*3/uL (ref 4.0–10.5)

## 2013-11-27 LAB — BASIC METABOLIC PANEL
Anion gap: 14 (ref 5–15)
BUN: 13 mg/dL (ref 6–23)
CHLORIDE: 83 meq/L — AB (ref 96–112)
CO2: 24 mEq/L (ref 19–32)
Calcium: 9.3 mg/dL (ref 8.4–10.5)
Creatinine, Ser: 0.64 mg/dL (ref 0.50–1.10)
GFR calc non Af Amer: >90 mL/min (ref >90)
Glucose, Bld: 105 mg/dL — ABNORMAL HIGH (ref 70–99)
POTASSIUM: 4.4 meq/L (ref 3.7–5.3)
Sodium: 121 mEq/L — CL (ref 137–147)

## 2013-11-27 LAB — URINALYSIS, ROUTINE W REFLEX MICROSCOPIC
Bilirubin Urine: NEGATIVE
Bilirubin Urine: NEGATIVE
GLUCOSE, UA: NEGATIVE mg/dL
Glucose, UA: NEGATIVE mg/dL
HGB URINE DIPSTICK: NEGATIVE
Hgb urine dipstick: NEGATIVE
Ketones, ur: NEGATIVE mg/dL
Ketones, ur: NEGATIVE mg/dL
NITRITE: NEGATIVE
Nitrite: NEGATIVE
PH: 7 (ref 5.0–8.0)
Protein, ur: NEGATIVE mg/dL
Protein, ur: NEGATIVE mg/dL
SPECIFIC GRAVITY, URINE: 1.016 (ref 1.005–1.030)
SPECIFIC GRAVITY, URINE: 1.013 (ref 1.005–1.030)
UROBILINOGEN UA: 0.2 mg/dL (ref 0.0–1.0)
Urobilinogen, UA: 0.2 mg/dL (ref 0.0–1.0)
pH: 6.5 (ref 5.0–8.0)

## 2013-11-27 LAB — CBG MONITORING, ED: GLUCOSE-CAPILLARY: 116 mg/dL — AB (ref 70–99)

## 2013-11-27 LAB — CBC
HCT: 32.8 % — ABNORMAL LOW (ref 36.0–46.0)
HEMOGLOBIN: 11.7 g/dL — AB (ref 12.0–15.0)
MCH: 29.3 pg (ref 26.0–34.0)
MCHC: 35.7 g/dL (ref 30.0–36.0)
MCV: 82 fL (ref 78.0–100.0)
Platelets: 153 10*3/uL (ref 150–400)
RBC: 4 MIL/uL (ref 3.87–5.11)
RDW: 12.7 % (ref 11.5–15.5)
WBC: 6.9 10*3/uL (ref 4.0–10.5)

## 2013-11-27 LAB — RAPID URINE DRUG SCREEN, HOSP PERFORMED
Amphetamines: NOT DETECTED
Barbiturates: NOT DETECTED
Benzodiazepines: NOT DETECTED
COCAINE: NOT DETECTED
Opiates: NOT DETECTED
Tetrahydrocannabinol: NOT DETECTED

## 2013-11-27 LAB — URINE MICROSCOPIC-ADD ON

## 2013-11-27 LAB — CREATININE, SERUM
Creatinine, Ser: 0.6 mg/dL (ref 0.50–1.10)
GFR calc Af Amer: >90 mL/min (ref >90)
GFR calc non Af Amer: >90 mL/min (ref >90)

## 2013-11-27 LAB — URIC ACID: URIC ACID, SERUM: 2.8 mg/dL (ref 2.4–7.0)

## 2013-11-27 LAB — OSMOLALITY, URINE
OSMOLALITY UR: 488 mosm/kg (ref 390–1090)
OSMOLALITY UR: 483 mosm/kg (ref 390–1090)

## 2013-11-27 LAB — TSH: TSH: 1.1 u[IU]/mL (ref 0.350–4.500)

## 2013-11-27 LAB — SODIUM, URINE, RANDOM
SODIUM UR: 116 meq/L
SODIUM UR: 133 meq/L

## 2013-11-27 MED ORDER — ENOXAPARIN SODIUM 40 MG/0.4ML ~~LOC~~ SOLN
40.0000 mg | SUBCUTANEOUS | Status: DC
  Administered 2013-11-27 – 2013-11-28 (×2): 40 mg via SUBCUTANEOUS
  Filled 2013-11-27 (×4): qty 0.4

## 2013-11-27 MED ORDER — ACETAMINOPHEN 325 MG PO TABS
650.0000 mg | ORAL_TABLET | Freq: Four times a day (QID) | ORAL | Status: DC | PRN
  Administered 2013-11-28 – 2013-11-29 (×3): 650 mg via ORAL
  Filled 2013-11-27 (×4): qty 2

## 2013-11-27 MED ORDER — SODIUM CHLORIDE 0.9 % IV SOLN
INTRAVENOUS | Status: DC
  Administered 2013-11-27 – 2013-11-29 (×3): via INTRAVENOUS

## 2013-11-27 MED ORDER — DEXTROSE 5 % IV SOLN
1.0000 g | Freq: Once | INTRAVENOUS | Status: DC
  Administered 2013-11-27: 1 g via INTRAVENOUS
  Filled 2013-11-27: qty 10

## 2013-11-27 MED ORDER — ONDANSETRON HCL 4 MG PO TABS: 4.0000 mg | ORAL_TABLET | Freq: Four times a day (QID) | ORAL | Status: DC | PRN

## 2013-11-27 MED ORDER — MORPHINE SULFATE 2 MG/ML IJ SOLN
2.0000 mg | INTRAMUSCULAR | Status: DC | PRN
  Administered 2013-11-27 – 2013-11-28 (×2): 2 mg via INTRAVENOUS
  Filled 2013-11-27 (×2): qty 1

## 2013-11-27 MED ORDER — SODIUM CHLORIDE 0.9 % IJ SOLN
3.0000 mL | Freq: Two times a day (BID) | INTRAMUSCULAR | Status: DC
Start: 2013-11-27 — End: 2013-11-29
  Administered 2013-11-27 – 2013-11-28 (×3): 3 mL via INTRAVENOUS

## 2013-11-27 MED ORDER — SENNA 8.6 MG PO TABS
1.0000 | ORAL_TABLET | Freq: Two times a day (BID) | ORAL | Status: DC
Start: 2013-11-27 — End: 2013-11-29
  Administered 2013-11-27 – 2013-11-29 (×3): 8.6 mg via ORAL
  Filled 2013-11-27 (×4): qty 1

## 2013-11-27 MED ORDER — LISINOPRIL 20 MG PO TABS
20.0000 mg | ORAL_TABLET | Freq: Every day | ORAL | Status: DC
  Administered 2013-11-28 – 2013-11-29 (×3): 20 mg via ORAL
  Filled 2013-11-27 (×3): qty 1

## 2013-11-27 MED ORDER — OXYCODONE HCL 5 MG PO TABS
5.0000 mg | ORAL_TABLET | ORAL | Status: DC | PRN
  Administered 2013-11-27 – 2013-11-28 (×3): 5 mg via ORAL
  Filled 2013-11-27 (×4): qty 1

## 2013-11-27 MED ORDER — ONDANSETRON HCL 4 MG/2ML IJ SOLN: 4.0000 mg | Freq: Four times a day (QID) | INTRAMUSCULAR | Status: DC | PRN

## 2013-11-27 MED ORDER — ATORVASTATIN CALCIUM 20 MG PO TABS
20.0000 mg | ORAL_TABLET | Freq: Every day | ORAL | Status: DC
  Administered 2013-11-28 – 2013-11-29 (×2): 20 mg via ORAL
  Filled 2013-11-27 (×3): qty 1

## 2013-11-27 MED ORDER — ACETAMINOPHEN 650 MG RE SUPP: 650.0000 mg | Freq: Four times a day (QID) | RECTAL | Status: DC | PRN

## 2013-11-27 MED ORDER — LEVOTHYROXINE SODIUM 75 MCG PO TABS
75.0000 ug | ORAL_TABLET | Freq: Every day | ORAL | Status: DC
  Administered 2013-11-28 – 2013-11-29 (×2): 75 ug via ORAL
  Filled 2013-11-27 (×5): qty 1

## 2013-11-27 MED ORDER — ALUM & MAG HYDROXIDE-SIMETH 200-200-20 MG/5ML PO SUSP: 30.0000 mL | Freq: Four times a day (QID) | ORAL | Status: DC | PRN

## 2013-11-27 MED ORDER — SODIUM CHLORIDE 0.9 % IV SOLN
Freq: Once | INTRAVENOUS | Status: AC
  Administered 2013-11-27: 16:00:00 via INTRAVENOUS

## 2013-11-27 NOTE — ED Notes (Signed)
Family at bedside. 

## 2013-11-27 NOTE — ED Notes (Signed)
Attempted to call report. RN unable to take report at this time. Will call back.  
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
## 2013-11-27 NOTE — ED Notes (Signed)
Lab called to inform that they need a redraw of an ETOH level, green tube, and a gold tube as well. Ajsa RN made aware.
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
## 2013-11-27 NOTE — ED Notes (Signed)
Pt ell on Sunday and was seen in ED, pt states she is dizzy and not able to process her thoughts. Pt is very slow to speak and answer questions. Pt eyes are swollen and has redness to left eye.

## 2013-11-27 NOTE — ED Notes (Signed)
Patient just urinated. Will have to wait on urine sample.

## 2013-11-28 LAB — CBC
HCT: 38.8 % (ref 36.0–46.0)
Hemoglobin: 13.9 g/dL (ref 12.0–15.0)
MCH: 29.8 pg (ref 26.0–34.0)
MCHC: 35.8 g/dL (ref 30.0–36.0)
MCV: 83.3 fL (ref 78.0–100.0)
Platelets: 200 10*3/uL (ref 150–400)
RBC: 4.66 MIL/uL (ref 3.87–5.11)
RDW: 13.1 % (ref 11.5–15.5)
WBC: 5.7 10*3/uL (ref 4.0–10.5)

## 2013-11-28 LAB — BASIC METABOLIC PANEL
ANION GAP: 17 — AB (ref 5–15)
Anion gap: 9 (ref 5–15)
Anion gap: 16 — ABNORMAL HIGH (ref 5–15)
BUN: 11 mg/dL (ref 6–23)
BUN: 10 mg/dL (ref 6–23)
BUN: 16 mg/dL (ref 6–23)
CALCIUM: 9.5 mg/dL (ref 8.4–10.5)
CHLORIDE: 96 meq/L (ref 96–112)
CO2: 23 mEq/L (ref 19–32)
CO2: 28 mEq/L (ref 19–32)
CO2: 23 mEq/L (ref 19–32)
CREATININE: 0.65 mg/dL (ref 0.50–1.10)
CREATININE: 0.66 mg/dL (ref 0.50–1.10)
CREATININE: 0.71 mg/dL (ref 0.50–1.10)
Calcium: 9.8 mg/dL (ref 8.4–10.5)
Calcium: 9.9 mg/dL (ref 8.4–10.5)
Chloride: 89 mEq/L — ABNORMAL LOW (ref 96–112)
Chloride: 96 mEq/L (ref 96–112)
GFR calc Af Amer: >90 mL/min (ref >90)
GFR calc Af Amer: >90 mL/min (ref >90)
Glucose, Bld: 75 mg/dL (ref 70–99)
Glucose, Bld: 85 mg/dL (ref 70–99)
Glucose, Bld: 114 mg/dL — ABNORMAL HIGH (ref 70–99)
POTASSIUM: 4.6 meq/L (ref 3.7–5.3)
Potassium: 5.5 mEq/L — ABNORMAL HIGH (ref 3.7–5.3)
Potassium: 4.4 mEq/L (ref 3.7–5.3)
Sodium: 129 mEq/L — ABNORMAL LOW (ref 137–147)
Sodium: 133 mEq/L — ABNORMAL LOW (ref 137–147)
Sodium: 135 mEq/L — ABNORMAL LOW (ref 137–147)

## 2013-11-28 LAB — URINE CULTURE: Colony Count: 30000

## 2013-11-28 LAB — OSMOLALITY: OSMOLALITY: 254 mosm/kg — AB (ref 275–300)

## 2013-11-28 MED ORDER — SERTRALINE HCL 100 MG PO TABS
200.0000 mg | ORAL_TABLET | Freq: Every day | ORAL | Status: DC
  Administered 2013-11-28 – 2013-11-29 (×2): 200 mg via ORAL
  Filled 2013-11-28 (×2): qty 2

## 2013-11-28 NOTE — Progress Notes (Signed)
[facility] PROGRESS NOTE  Janet LoraCheryl Turner ZOX:[id number] DOB: 05/06/1966 DOA: 11/27/2013 PCP: Loreen FreudYvonne Lowne, DO  Assessment/Plan:   Encephalopathy - Most likely 2ary to metabolite abnormality (hyponatremia) - resolving and per family patient back to normal.   Hyponatremia - improving off zoloft and trileptal - will have to start lower dose zoloft so patient does not withdrawal  - Most likely due to zoloft and trileptal. Will consult Psychiatry    Active Problems:   HTN (hypertension) - on lisinopril, stable    Unspecified hypothyroidism - TSH within normal limits    History of bipolar disorder - Consulted psychiatry   Code Status: full Family Communication: discussed with patient and spouse at bedside. Disposition Plan: None   Consultants:  None  Procedures:  None  Antibiotics:  none  HPI/Subjective: No new problems reported. Pt currently feeling better. Husband states that patient is back to baseline.  Objective: Filed Vitals:   11/28/13 0609  BP: 123/61  Pulse: 56  Temp: 97.5 F (36.4 C)  Resp: 18    Intake/Output Summary (Last 24 hours) at 11/28/13 1442 Last data filed at 11/28/13 [id number]  Gross per 24 hour  Intake   1688 ml  Output   2850 ml  Net  -1162 ml   Filed Weights   11/27/13 1747  Weight: 81.6 kg (179 lb 14.3 oz)    Exam:   General:  Pt in nad, alert and awake  Cardiovascular: rrr, no mrg  Respiratory: cta bl, no wheezes  Abdomen: soft, NT, ND  Musculoskeletal: no cyanosis or clubbing.   Data Reviewed: Basic Metabolic Panel:  Recent Labs Lab 11/27/13 1313 11/27/13 1830 11/28/13 0557 11/28/13 1139  NA 121*  --  129* 133*  K 4.4  --  5.5* 4.6  CL 83*  --  89* 96  CO2 24  --  23 28  GLUCOSE 105*  --  75 85  BUN 13  --  11 10  CREATININE 0.64 0.60 0.65 0.66  CALCIUM 9.3  --  9.5 9.8   Liver Function Tests: No results found for this basename: AST, ALT, ALKPHOS, BILITOT, PROT, ALBUMIN,  in the last 168 hours No  results found for this basename: LIPASE, AMYLASE,  in the last 168 hours No results found for this basename: AMMONIA,  in the last 168 hours CBC:  Recent Labs Lab 11/27/13 1313 11/27/13 1830 11/28/13 0557  WBC 7.4 6.9 5.7  NEUTROABS 5.7  --   --   HGB 12.1 11.7* 13.9  HCT 34.1* 32.8* 38.8  MCV 82.0 82.0 83.3  PLT 225 153 200   Cardiac Enzymes: No results found for this basename: CKTOTAL, CKMB, CKMBINDEX, TROPONINI,  in the last 168 hours BNP (last 3 results) No results found for this basename: PROBNP,  in the last 8760 hours CBG:  Recent Labs Lab 11/27/13 1314  GLUCAP 116*    No results found for this or any previous visit (from the past 240 hour(s)).   Studies: Ct Head Wo Contrast  11/27/2013   CLINICAL DATA:  Status post fall on Sunday now with difficulty with mentation known soft tissue injury over the forehead.  EXAM: CT HEAD WITHOUT CONTRAST  TECHNIQUE: Contiguous axial images were obtained from the base of the skull through the vertex without intravenous contrast.  COMPARISON:  None.  FINDINGS: The ventricles are normal in size and position. There is no intracranial hemorrhage nor intracranial mass effect. There is no acute ischemic change. The cerebellum and brainstem are normal.  The observed paranasal sinuses and mastoid air cells are clear. There is no acute skull fracture. There has been interval partial resolution of the left periorbital soft tissue swelling.  IMPRESSION: There is no acute intracranial abnormality.   Electronically Signed   By: David  [place]   On: 11/27/2013 13:57    Scheduled Meds: . atorvastatin  20 mg Oral Daily  . enoxaparin (LOVENOX) injection  40 mg Subcutaneous Q24H  . levothyroxine  75 mcg Oral QAC breakfast  . lisinopril  20 mg Oral Daily  . senna  1 tablet Oral BID  . sertraline  200 mg Oral Daily  . sodium chloride  3 mL Intravenous Q12H   Continuous Infusions: . sodium chloride 100 mL/hr at 11/27/13 2312     Time spent: > 35  minutes    Penny Pia  [facility] Pager [phone] If 7PM-7AM, please contact night-coverage at [email or web address], password [facility] 11/28/2013, 2:42 PM  LOS: 1 day

## 2013-11-29 DIAGNOSIS — F3130 Bipolar disorder, current episode depressed, mild or moderate severity, unspecified: Secondary | ICD-10-CM

## 2013-11-29 LAB — BASIC METABOLIC PANEL
Anion gap: 13 (ref 5–15)
Anion gap: 10 (ref 5–15)
BUN: 18 mg/dL (ref 6–23)
BUN: 17 mg/dL (ref 6–23)
CHLORIDE: 99 meq/L (ref 96–112)
CO2: 27 mEq/L (ref 19–32)
CO2: 30 mEq/L (ref 19–32)
Calcium: 10 mg/dL (ref 8.4–10.5)
Calcium: 9.9 mg/dL (ref 8.4–10.5)
Chloride: 94 mEq/L — ABNORMAL LOW (ref 96–112)
Creatinine, Ser: 0.85 mg/dL (ref 0.50–1.10)
Creatinine, Ser: 0.78 mg/dL (ref 0.50–1.10)
GFR calc Af Amer: >90 mL/min (ref >90)
GFR calc Af Amer: >90 mL/min (ref >90)
GFR calc non Af Amer: 79 mL/min — ABNORMAL LOW (ref >90)
GFR calc non Af Amer: >90 mL/min (ref >90)
Glucose, Bld: 116 mg/dL — ABNORMAL HIGH (ref 70–99)
Glucose, Bld: 100 mg/dL — ABNORMAL HIGH (ref 70–99)
POTASSIUM: 5.1 meq/L (ref 3.7–5.3)
POTASSIUM: 5.2 meq/L (ref 3.7–5.3)
Sodium: 134 mEq/L — ABNORMAL LOW (ref 137–147)
Sodium: 139 mEq/L (ref 137–147)

## 2013-11-29 MED ORDER — LAMOTRIGINE 25 MG PO TABS
25.0000 mg | ORAL_TABLET | Freq: Two times a day (BID) | ORAL | Status: DC
  Filled 2013-11-29: qty 1

## 2013-11-29 MED ORDER — LAMOTRIGINE 25 MG PO TABS: 25.0000 mg | ORAL_TABLET | Freq: Two times a day (BID) | ORAL | Status: DC

## 2013-11-29 NOTE — ED Provider Notes (Signed)
Medical screening examination/treatment/procedure(s) were performed by non-physician practitioner and as supervising physician I was immediately available for consultation/collaboration.   EKG Interpretation None       Raeford RazorStephen Tynslee Bowlds, MD 11/29/13 1306

## 2013-11-29 NOTE — ED Provider Notes (Signed)
Medical screening examination/treatment/procedure(s) were performed by non-physician practitioner and as supervising physician I was immediately available for consultation/collaboration.   EKG Interpretation   Date/Time:  Tuesday November 27 2013 13:19:34 EDT Ventricular Rate:  64 PR Interval:  179 QRS Duration: 84 QT Interval:  403 QTC Calculation: 416 R Axis:   61 Text Interpretation:  Sinus rhythm Baseline wander in lead(s) I Confirmed  by Laurelin Elson,  DO, Ryver Zadrozny ([id number]) on 11/27/2013 1:55:45 PM        Layla MawKristen N Jakeb Lamping, DO 11/29/13 1102
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
## 2013-11-29 NOTE — Discharge Summary (Signed)
Physician Discharge Summary  Janet Turner VWU:[id number] DOB: [birth year] DOA: 11/27/2013  PCP: Loreen Freud, DO  Admit date: 11/27/2013 Discharge date: 11/29/2013  Time spent: > 35 minutes  Recommendations for Outpatient Follow-up:  1. Please reassess sodium levels 2. Repeat bmp within 1-2 weeks after discharge  Discharge Diagnoses:  Active Problems:   HTN (hypertension)   Unspecified hypothyroidism   Hyponatremia   History of bipolar disorder   Encephalopathy   Discharge Condition: stable  Diet recommendation: regular diet.  Filed Weights   11/27/13 1747  Weight: 81.6 kg (179 lb 14.3 oz)    History of present illness:  48 y/o with h/o bipolar d/o and htn who presented with ams and sodium levels below normal limits to the eD  Hospital Course:  Encephalopathy  - Most likely 2ary to metabolite abnormality (hyponatremia)  - resolved and per family patient back to normal.   Hyponatremia  - improving off zoloft and trileptal  - pt to continue zoloft as she has been on this medication for a long time and most likely drop of sodium per my discussion with psychiatrist is that this was 2ary to trileptal. - plan is to d/c trileptal and start on lamictal.  Active Problems:  HTN (hypertension)  - on lisinopril, stable   Unspecified hypothyroidism  - TSH within normal limits   History of bipolar disorder  - Consulted psychiatry and recommending discontinuing trileptal and starting lamictal   Procedures:  None   Consultations:  psychiatry  Discharge Exam: Filed Vitals:   11/29/13 0628  BP: 115/59  Pulse: 59  Temp: 98.3 F (36.8 C)  Resp: 20    General: pt in nad, alert and awake Cardiovascular: rrr, no mrg Respiratory: cta bl, no wheezes  Discharge Instructions You were cared for by a hospitalist during your hospital stay. If you have any questions about your discharge medications or the care you received while you were in the hospital after you are  discharged, you can call the unit and asked to speak with the hospitalist on call if the hospitalist that took care of you is not available. Once you are discharged, your primary care physician will handle any further medical issues. Please note that NO REFILLS for any discharge medications will be authorized once you are discharged, as it is imperative that you return to your primary care physician (or establish a relationship with a primary care physician if you do not have one) for your aftercare needs so that they can reassess your need for medications and monitor your lab values.  Discharge Instructions   Call MD for:  difficulty breathing, headache or visual disturbances    Complete by:  As directed      Call MD for:  temperature >100.4    Complete by:  As directed      Diet - low sodium heart healthy    Complete by:  As directed      Increase activity slowly    Complete by:  As directed             Medication List         atorvastatin 20 MG tablet  Commonly known as:  LIPITOR  Take 20 mg by mouth daily.     lamoTRIgine 25 MG tablet  Commonly known as:  LAMICTAL  Take 1 tablet (25 mg total) by mouth 2 (two) times daily.     levothyroxine 75 MCG tablet  Commonly known as:  SYNTHROID, LEVOTHROID  Take 75 mcg by  mouth daily before breakfast.     lisinopril 20 MG tablet  Commonly known as:  PRINIVIL,ZESTRIL  Take 20 mg by mouth daily.     pramipexole 0.25 MG tablet  Commonly known as:  MIRAPEX  Take 0.25 mg by mouth at bedtime.     sertraline 100 MG tablet  Commonly known as:  ZOLOFT  Take 200 mg by mouth daily.       Allergies  Allergen Reactions  . Penicillins Hives and Itching      The results of significant diagnostics from this hospitalization (including imaging, microbiology, ancillary and laboratory) are listed below for reference.    Significant Diagnostic Studies: Ct Head Wo Contrast  11/27/2013   CLINICAL DATA:  Status post fall on Sunday now with  difficulty with mentation known soft tissue injury over the forehead.  EXAM: CT HEAD WITHOUT CONTRAST  TECHNIQUE: Contiguous axial images were obtained from the base of the skull through the vertex without intravenous contrast.  COMPARISON:  None.  FINDINGS: The ventricles are normal in size and position. There is no intracranial hemorrhage nor intracranial mass effect. There is no acute ischemic change. The cerebellum and brainstem are normal.  The observed paranasal sinuses and mastoid air cells are clear. There is no acute skull fracture. There has been interval partial resolution of the left periorbital soft tissue swelling.  IMPRESSION: There is no acute intracranial abnormality.   Electronically Signed   By: David  [place]   On: 11/27/2013 13:57   Ct Maxillofacial Wo Cm  11/25/2013   CLINICAL DATA:  Status post fall; hit face on brick paver. Left supraorbital hematoma.  EXAM: CT MAXILLOFACIAL WITHOUT CONTRAST  TECHNIQUE: Multidetector CT imaging of the maxillofacial structures was performed. Multiplanar CT image reconstructions were also generated. A small metallic BB was placed on the right temple in order to reliably differentiate right from left.  COMPARISON:  None.  FINDINGS: There is no evidence of fracture or dislocation. The maxilla and mandible appear intact. The nasal bone is unremarkable in appearance. The visualized dentition demonstrates no acute abnormality.  The orbits are intact bilaterally. The visualized paranasal sinuses and mastoid air cells are well-aerated.  A soft tissue laceration is noted along the lateral aspect of the left eyelids, with minimal soft tissue air and soft tissue swelling. The parapharyngeal fat planes are preserved. The nasopharynx, oropharynx and hypopharynx are unremarkable in appearance. The visualized portions of the valleculae and piriform sinuses are grossly unremarkable.  The parotid and submandibular glands are within normal limits. No cervical lymphadenopathy  is seen. Cervical spinal fusion hardware is partially imaged. The visualized portions of the brain are unremarkable in appearance.  IMPRESSION: 1. No evidence of fracture or dislocation with regard to the maxillofacial structures. 2. Soft tissue laceration along the lateral aspect of the left eyelids, with minimal soft tissue air and soft tissue swelling.   Electronically Signed   By: Roanna RaiderJeffery  Chang M.D.   On: 11/25/2013 23:59    Microbiology: Recent Results (from the past 240 hour(s))  URINE CULTURE     Status: None   Collection Time    11/27/13  2:38 PM      Result Value Ref Range Status   Specimen Description URINE, RANDOM   Final   Special Requests NONE   Final   Culture  Setup Time     Final   Value: 11/27/2013 19:45     Performed at [facility]   Colony Count     Final  Value: 30,000 COLONIES/ML     Performed at [facility]   Culture     Final   Value: Multiple bacterial morphotypes present, none predominant. Suggest appropriate recollection if clinically indicated.     Performed at [facility]   Report Status 11/28/2013 FINAL   Final     Labs: Basic Metabolic Panel:  Recent Labs Lab 11/28/13 0557 11/28/13 1139 11/28/13 1942 11/29/13 0325 11/29/13 1146  NA 129* 133* 135* 134* 139  K 5.5* 4.6 4.4 5.1 5.2  CL 89* 96 96 94* 99  CO2 23 28 23 27 30   GLUCOSE 75 85 114* 116* 100*  BUN 11 10 16 18 17   CREATININE 0.65 0.66 0.71 0.85 0.78  CALCIUM 9.5 9.8 9.9 10.0 9.9   Liver Function Tests: No results found for this basename: AST, ALT, ALKPHOS, BILITOT, PROT, ALBUMIN,  in the last 168 hours No results found for this basename: LIPASE, AMYLASE,  in the last 168 hours No results found for this basename: AMMONIA,  in the last 168 hours CBC:  Recent Labs Lab 11/27/13 1313 11/27/13 1830 11/28/13 0557  WBC 7.4 6.9 5.7  NEUTROABS 5.7  --   --   HGB 12.1 11.7* 13.9  HCT 34.1* 32.8* 38.8  MCV 82.0 82.0 83.3  PLT 225 153 200   Cardiac  Enzymes: No results found for this basename: CKTOTAL, CKMB, CKMBINDEX, TROPONINI,  in the last 168 hours BNP: BNP (last 3 results) No results found for this basename: PROBNP,  in the last 8760 hours CBG:  Recent Labs Lab 11/27/13 1314  GLUCAP 116*       Signed:  Penny Pia  [facility] 11/29/2013, 4:19 PM
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
## 2013-12-18 ENCOUNTER — Encounter: Payer: Self-pay | Admitting: Family Medicine

## 2013-12-18 ENCOUNTER — Ambulatory Visit (INDEPENDENT_AMBULATORY_CARE_PROVIDER_SITE_OTHER): Payer: 59 | Admitting: Family Medicine

## 2013-12-18 VITALS — BP 124/62 | HR 79 | Temp 98.0°F | Wt 176.0 lb

## 2013-12-18 DIAGNOSIS — E785 Hyperlipidemia, unspecified: Secondary | ICD-10-CM

## 2013-12-18 DIAGNOSIS — E871 Hypo-osmolality and hyponatremia: Secondary | ICD-10-CM

## 2013-12-18 DIAGNOSIS — F3181 Bipolar II disorder: Secondary | ICD-10-CM

## 2013-12-18 DIAGNOSIS — F3189 Other bipolar disorder: Secondary | ICD-10-CM

## 2013-12-18 LAB — HEPATIC FUNCTION PANEL
ALK PHOS: 96 U/L (ref 39–117)
ALT: 19 U/L (ref 0–35)
AST: 23 U/L (ref 0–37)
Albumin: 4.3 g/dL (ref 3.5–5.2)
Bilirubin, Direct: 0 mg/dL (ref 0.0–0.3)
TOTAL PROTEIN: 7.8 g/dL (ref 6.0–8.3)
Total Bilirubin: 0.4 mg/dL (ref 0.2–1.2)

## 2013-12-18 LAB — LIPID PANEL
Cholesterol: 187 mg/dL (ref 0–200)
HDL: 69.3 mg/dL (ref >39.00)
LDL CALC: 85 mg/dL (ref 0–99)
NonHDL: 117.7
TRIGLYCERIDES: 164 mg/dL — AB (ref 0.0–149.0)
Total CHOL/HDL Ratio: 3
VLDL: 32.8 mg/dL (ref 0.0–40.0)

## 2013-12-18 LAB — POCT URINALYSIS DIPSTICK
BILIRUBIN UA: NEGATIVE
Blood, UA: NEGATIVE
GLUCOSE UA: NEGATIVE
Ketones, UA: NEGATIVE
Leukocytes, UA: NEGATIVE
Nitrite, UA: NEGATIVE
Protein, UA: NEGATIVE
SPEC GRAV UA: 1.01
Urobilinogen, UA: 0.2
pH, UA: 6

## 2013-12-18 LAB — BASIC METABOLIC PANEL
BUN: 16 mg/dL (ref 6–23)
CHLORIDE: 98 meq/L (ref 96–112)
CO2: 24 meq/L (ref 19–32)
CREATININE: 0.8 mg/dL (ref 0.4–1.2)
Calcium: 10 mg/dL (ref 8.4–10.5)
GFR: 84.65 mL/min (ref >60.00)
GLUCOSE: 76 mg/dL (ref 70–99)
Potassium: 3.9 mEq/L (ref 3.5–5.1)
SODIUM: 136 meq/L (ref 135–145)

## 2013-12-18 MED ORDER — SERTRALINE HCL 100 MG PO TABS: 200.0000 mg | ORAL_TABLET | Freq: Every day | ORAL | Status: DC

## 2013-12-18 MED ORDER — LAMOTRIGINE 100 MG PO TABS: ORAL_TABLET | ORAL | Status: DC

## 2013-12-18 NOTE — Progress Notes (Signed)
Pre visit review using our clinic review tool, if applicable. No additional management support is needed unless otherwise documented below in the visit note. 

## 2013-12-18 NOTE — Patient Instructions (Signed)

## 2013-12-18 NOTE — Progress Notes (Signed)
   Subjective:    Patient ID: Janet Turner, female    DOB: 02/24/1966, 48 y.o.   MRN: [id number]  HPI Pt here to f/u from hospital.  She had low sodium secondary to meds.  Med was changed to lamictal.  Pt does not feel like it is helping as much.  She has an appointment with psych in 2 weeks.    Review of Systems As above    Objective:   Physical Exam BP 124/62  Pulse 79  Temp(Src) 98 F (36.7 C) (Oral)  Wt 176 lb (79.833 kg)  SpO2 96% General appearance: alert, cooperative, appears stated age and no distress Throat: lips, mucosa, and tongue normal; teeth and gums normal Neck: no adenopathy, no carotid bruit, no JVD, supple, symmetrical, trachea midline and thyroid not enlarged, symmetric, no tenderness/mass/nodules Lungs: clear to auscultation bilaterally Heart: S1, S2 normal Extremities: extremities normal, atraumatic, no cyanosis or edema       Assessment & Plan:  1. Bipolar II disorder Increase lamictal to 150 bid ---  Then in 1 week increase to 200 mg bid - sertraline (ZOLOFT) 100 MG tablet; Take 2 tablets (200 mg total) by mouth daily.  Dispense: 180 tablet; Refill: 3  2. Hyponatremia Check labs today   Chemistry      Component Value Date/Time   NA 139 11/29/2013 1146   K 5.2 11/29/2013 1146   CL 99 11/29/2013 1146   CO2 30 11/29/2013 1146   BUN 17 11/29/2013 1146   CREATININE 0.78 11/29/2013 1146      Component Value Date/Time   CALCIUM 9.9 11/29/2013 1146   ALKPHOS 68 07/23/2013 1036   AST 35 07/23/2013 1036   ALT 36* 07/23/2013 1036   BILITOT 0.3 07/23/2013 1036      - Basic metabolic panel - Basic metabolic panel; Future - Hepatic function panel; Future - Lipid panel; Future - POCT urinalysis dipstick; Future  3. Other and unspecified hyperlipidemia Check labs today - Basic metabolic panel; Future - Hepatic function panel; Future - Lipid panel; Future - POCT urinalysis dipstick; Future

## 2013-12-18 NOTE — Addendum Note (Signed)
Addended by: Silvio PateHOMPSON, Rox Mcgriff D on: 12/18/2013 12:52 PM   Modules accepted: Orders

## 2014-01-18 ENCOUNTER — Encounter: Payer: Self-pay | Admitting: Family Medicine

## 2014-01-18 ENCOUNTER — Ambulatory Visit (INDEPENDENT_AMBULATORY_CARE_PROVIDER_SITE_OTHER): Payer: 59 | Admitting: Family Medicine

## 2014-01-18 VITALS — BP 147/70 | HR 54 | Temp 98.2°F | Wt 180.3 lb

## 2014-01-18 DIAGNOSIS — F3162 Bipolar disorder, current episode mixed, moderate: Secondary | ICD-10-CM | POA: Insufficient documentation

## 2014-01-18 DIAGNOSIS — E785 Hyperlipidemia, unspecified: Secondary | ICD-10-CM

## 2014-01-18 DIAGNOSIS — I10 Essential (primary) hypertension: Secondary | ICD-10-CM

## 2014-01-18 NOTE — Progress Notes (Signed)
Pre visit review using our clinic review tool, if applicable. No additional management support is needed unless otherwise documented below in the visit note. 

## 2014-01-18 NOTE — Progress Notes (Signed)
  Subjective:    Patient here for follow-up of elevated blood pressure.  She is not exercising and is adherent to a low-salt diet.  Blood pressure is well controlled at home. Cardiac symptoms: none. Patient denies: chest pain, chest pressure/discomfort, claudication, dyspnea, exertional chest pressure/discomfort, fatigue, irregular heart beat, lower extremity edema, near-syncope, orthopnea, palpitations, paroxysmal nocturnal dyspnea, syncope and tachypnea. Cardiovascular risk factors: dyslipidemia and hypertension. Use of agents associated with hypertension: none. History of target organ damage: none.  The following portions of the patient's history were reviewed and updated as appropriate: allergies, current medications, past family history, past medical history, past social history, past surgical history and problem list.  Review of Systems Pertinent items are noted in HPI.     Objective:    BP 147/70  Pulse 54  Temp(Src) 98.2 F (36.8 C) (Oral)  Wt 180 lb 5.4 oz (81.8 kg)  SpO2 99% General appearance: alert, cooperative, appears stated age and no distress Neck: no adenopathy, supple, symmetrical, trachea midline and thyroid not enlarged, symmetric, no tenderness/mass/nodules Lungs: clear to auscultation bilaterally Heart: S1, S2 normal Extremities: extremities normal, atraumatic, no cyanosis or edema    Assessment:    Hypertension, normal blood pressure . Evidence of target organ damage: none.    Plan:    Medication: no change. Dietary sodium restriction. Regular aerobic exercise. Follow up: 6 months and as needed.   1. Bipolar 1 disorder, mixed, moderate Per psych  2. Essential hypertension Stable, con't meds   3. Other and unspecified hyperlipidemia Lab Results  Component Value Date   CHOL 187 12/18/2013   CHOL 131 07/23/2013   Lab Results  Component Value Date   HDL 69.30 12/18/2013   HDL 16.10 07/23/2013   Lab Results  Component Value Date   LDLCALC 85  12/18/2013   LDLCALC 51 07/23/2013   Lab Results  Component Value Date   TRIG 164.0* 12/18/2013   TRIG 106.0 07/23/2013   Lab Results  Component Value Date   CHOLHDL 3 12/18/2013   CHOLHDL 2 07/23/2013   No results found for this basename: LDLDIRECT   con't meds

## 2014-01-18 NOTE — Patient Instructions (Signed)

## 2014-02-12 ENCOUNTER — Other Ambulatory Visit (INDEPENDENT_AMBULATORY_CARE_PROVIDER_SITE_OTHER): Payer: 59

## 2014-02-12 ENCOUNTER — Telehealth: Payer: Self-pay

## 2014-02-12 DIAGNOSIS — Z79899 Other long term (current) drug therapy: Secondary | ICD-10-CM

## 2014-02-12 DIAGNOSIS — F319 Bipolar disorder, unspecified: Secondary | ICD-10-CM

## 2014-02-12 DIAGNOSIS — Z8659 Personal history of other mental and behavioral disorders: Secondary | ICD-10-CM

## 2014-02-12 LAB — LIPID PANEL
CHOLESTEROL: 204 mg/dL — AB (ref 0–200)
HDL: 70.7 mg/dL (ref >39.00)
LDL CALC: 107 mg/dL — AB (ref 0–99)
NonHDL: 133.3
TRIGLYCERIDES: 133 mg/dL (ref 0.0–149.0)
Total CHOL/HDL Ratio: 3
VLDL: 26.6 mg/dL (ref 0.0–40.0)

## 2014-02-12 LAB — GLUCOSE, RANDOM: Glucose, Bld: 83 mg/dL (ref 70–99)

## 2014-02-12 NOTE — Telephone Encounter (Signed)
Order presented to office from Dr Tiajuana AmassScott Cunningham for fasting blood sugar and lipid panel due to new med started (seroquel) ok per Dr Abner GreenspanBlyth. Enacted verbal orders.  Fax results to [phone] (Dr Tomasa Randunningham)

## 2014-02-21 ENCOUNTER — Other Ambulatory Visit (INDEPENDENT_AMBULATORY_CARE_PROVIDER_SITE_OTHER): Payer: Self-pay | Admitting: Surgery

## 2014-02-21 DIAGNOSIS — E230 Hypopituitarism: Secondary | ICD-10-CM

## 2014-02-25 ENCOUNTER — Encounter: Payer: Self-pay | Admitting: Family Medicine

## 2014-02-25 MED ORDER — LEVOTHYROXINE SODIUM 75 MCG PO TABS: 75.0000 ug | ORAL_TABLET | Freq: Every day | ORAL | Status: DC

## 2014-03-07 ENCOUNTER — Telehealth: Payer: Self-pay | Admitting: Family Medicine

## 2014-03-07 NOTE — Telephone Encounter (Signed)
Caller name: Cherly  Call back number:[phone]   Reason for call:  Pt states that she had mentioned possibly doing bariatric surgery.  She wants to do it now and she is needing a letter.  Would like  A call back to explain more.

## 2014-03-08 NOTE — Telephone Encounter (Signed)
Who is she going to go to?  Did she go to info session?

## 2014-03-08 NOTE — Telephone Encounter (Signed)
Patient aware and will fax the information over to us.      KP

## 2014-03-08 NOTE — Telephone Encounter (Signed)
[facility].      KP

## 2014-03-08 NOTE — Telephone Encounter (Signed)
Patient says she spoke with you about the Bariatric surgery and she wants a letter stating that the vertical sleeve gastric bypass is medically necessary due to her medications.     KP

## 2014-03-08 NOTE — Telephone Encounter (Signed)
Has she filled out paper work for them yet?  I need a list of everything she has ever tried-- how long she was on it, how much weight lost, gained and how many attempts.  Its important for the letter and Ins co

## 2014-03-15 ENCOUNTER — Ambulatory Visit (HOSPITAL_COMMUNITY)
Admission: RE | Admit: 2014-03-15 | Discharge: 2014-03-15 | Disposition: A | Discharge status: Home / Self Care | Payer: 59 | Source: Ambulatory Visit | Attending: Surgery | Admitting: Surgery

## 2014-03-15 ENCOUNTER — Other Ambulatory Visit: Payer: Self-pay

## 2014-03-15 DIAGNOSIS — Z01811 Encounter for preprocedural respiratory examination: Secondary | ICD-10-CM | POA: Insufficient documentation

## 2014-03-15 DIAGNOSIS — K449 Diaphragmatic hernia without obstruction or gangrene: Secondary | ICD-10-CM | POA: Diagnosis not present

## 2014-03-15 DIAGNOSIS — Z9049 Acquired absence of other specified parts of digestive tract: Secondary | ICD-10-CM | POA: Diagnosis not present

## 2014-03-15 DIAGNOSIS — Z13820 Encounter for screening for osteoporosis: Secondary | ICD-10-CM | POA: Diagnosis not present

## 2014-03-15 DIAGNOSIS — Z6841 Body Mass Index (BMI) 40.0 and over, adult: Secondary | ICD-10-CM | POA: Insufficient documentation

## 2014-03-15 DIAGNOSIS — E230 Hypopituitarism: Secondary | ICD-10-CM

## 2014-03-18 ENCOUNTER — Telehealth: Payer: 59 | Admitting: Family

## 2014-03-18 DIAGNOSIS — R197 Diarrhea, unspecified: Secondary | ICD-10-CM

## 2014-03-18 NOTE — Progress Notes (Signed)
We are sorry that you are not feeling well.  Here is how we plan to help!  Based on what you have shared with me it looks like you have Acute Infectious Diarrhea.  Most cases of acute diarrhea are due to infections with virus and bacteria and are self-limited conditions lasting less than 14 days.  For your symptoms you may take Imodium 2 mg tablets that are over the counter at your local pharmacy. Take two tablet now and then one after each loose stool up to 6 a day.  Antibiotics are not needed for most people with diarrhea.   HOME CARE  We recommend changing your diet to help with your symptoms for the next few days.  Drink plenty of fluids that contain water salt and sugar. Sports drinks such as Gatorade may help.   You may try broths, soups, bananas, applesauce, soft breads, mashed potatoes or crackers.   You are considered infectious for as long as the diarrhea continues. Hand washing or use of alcohol based hand sanitizers is recommend.  It is best to stay out of work or school until your symptoms stop.   GET HELP RIGHT AWAY  If you have dark yellow colored urine or do not pass urine frequently you should drink more fluids.    If your symptoms worsen   If you feel like you are going to pass out (faint)  You have a new problem  MAKE SURE YOU   Understand these instructions.  Will watch your condition.  Will get help right away if you are not doing well or get worse.  Your e-visit answers were reviewed by a board certified advanced clinical practitioner to complete your personal care plan.  Depending on the condition, your plan could have included both over the counter or prescription medications.  Please review your pharmacy choice.  If there is a problem, you may call our nursing hot line at [phone] and have the prescription routed to another pharmacy.  Your safety is important to us.  If you have drug allergies check your prescription carefully.    You can use  MyChart to ask questions about today's visit, request a non-urgent call back, or ask for a work or school excuse.  You will get an e-mail in the next two days asking about your experience.  I hope that your e-visit has been valuable and will speed your recovery. Thank you for using e-visits.

## 2014-03-19 ENCOUNTER — Encounter: Payer: Self-pay | Admitting: Physician Assistant

## 2014-03-19 ENCOUNTER — Ambulatory Visit (INDEPENDENT_AMBULATORY_CARE_PROVIDER_SITE_OTHER): Payer: 59 | Admitting: Physician Assistant

## 2014-03-19 VITALS — BP 125/83 | HR 89 | Temp 97.9°F | Resp 16 | Ht <= 58 in | Wt 189.5 lb

## 2014-03-19 DIAGNOSIS — K219 Gastro-esophageal reflux disease without esophagitis: Secondary | ICD-10-CM

## 2014-03-19 DIAGNOSIS — A084 Viral intestinal infection, unspecified: Secondary | ICD-10-CM

## 2014-03-19 MED ORDER — RANITIDINE HCL 150 MG PO TABS: 150.0000 mg | ORAL_TABLET | Freq: Two times a day (BID) | ORAL | Status: DC

## 2014-03-19 MED ORDER — DIPHENOXYLATE-ATROPINE 2.5-0.025 MG PO TABS: 1.0000 | ORAL_TABLET | Freq: Four times a day (QID) | ORAL | Status: DC | PRN

## 2014-03-19 NOTE — Assessment & Plan Note (Signed)
Increase fluids.  Rx Lomotil for loose stools.  Diet for diarrhea discussed with patient.  Follow-up as scheduled Thursday.

## 2014-03-19 NOTE — Progress Notes (Signed)
Pre visit review using our clinic review tool, if applicable. No additional management support is needed unless otherwise documented below in the visit note/SLS  

## 2014-03-19 NOTE — Progress Notes (Signed)
Patient presents to clinic today c/o acid reflux and 2 days of non-bloody diarrhea after having an EGD. Late last week.  Denies fever, chills, nausea/vomiting.  Endorses some abdominal cramping.  Endorses some mild fatigue.  Denies recent travel or sick contact. Denies abnormal foods, water sources. Denies melena, hematochezia or tenesmus.  Denies recent use of antibiotics or changes to medication.   Past Medical History  Diagnosis Date  . Hypertension   . Hypoglycemia   . Hyperlipemia   . Leg pain   . Chicken pox   . Depression   . Heart murmur   . Hypopituitarism   . PTSD (post-traumatic stress disorder)     Current Outpatient Prescriptions on File Prior to Visit  Medication Sig Dispense Refill  . atorvastatin (LIPITOR) 20 MG tablet Take 20 mg by mouth daily.    Marland Kitchen. lamoTRIgine (LAMICTAL) 100 MG tablet Take 200 mg by mouth daily.     Marland Kitchen. levothyroxine (SYNTHROID, LEVOTHROID) 75 MCG tablet Take 1 tablet (75 mcg total) by mouth daily before breakfast. 30 tablet 5  . lisinopril (PRINIVIL,ZESTRIL) 20 MG tablet Take 20 mg by mouth daily.    . pramipexole (MIRAPEX) 0.25 MG tablet Take 0.25 mg by mouth at bedtime.    . sertraline (ZOLOFT) 100 MG tablet Take 150 mg by mouth daily.     No current facility-administered medications on file prior to visit.    Allergies  Allergen Reactions  . Penicillins Hives and Itching    Family History  Problem Relation Age of Onset  . Osteoarthritis Mother   . Cancer Mother [id number]    breast--stage III  invasive ductal ca  . Hypertension Father   . Melanoma Father   . Depression Father     PTSD  . Cancer Father     melanoma  . Alcoholism    . Ovarian cancer Maternal Grandmother   . Breast cancer Maternal Grandmother   . Lung cancer Maternal Grandmother   . Prostate cancer Paternal Grandfather   . Stroke      Paternal Family--9/12 children after age 49  . Anuerysm      Paternal family  . Hypertension      Paternal family    History    Social History  . Marital Status: Married    Spouse Name: N/A    Number of Children: N/A  . Years of Education: N/A   Social History Main Topics  . Smoking status: Never Smoker   . Smokeless tobacco: Never Used  . Alcohol Use: No  . Drug Use: No  . Sexual Activity: Not Currently   Other Topics Concern  . None   Social History Narrative   Review of Systems - See HPI.  All other ROS are negative.  BP 125/83 mmHg  Pulse 89  Temp(Src) 97.9 F (36.6 C) (Oral)  Resp 16  Ht 4\' 9"  (1.448 m)  Wt 189 lb 8 oz (85.957 kg)  BMI 41.00 kg/m2  SpO2 97%  Physical Exam  Constitutional: She is oriented to person, place, and time and well-developed, well-nourished, and in no distress.  HENT:  Head: Normocephalic.  Eyes: Conjunctivae are normal.  Neck: Neck supple.  Cardiovascular: Normal rate, regular rhythm, normal heart sounds and intact distal pulses.   Pulmonary/Chest: Effort normal and breath sounds normal. No respiratory distress. She has no wheezes. She has no rales. She exhibits no tenderness.  Abdominal: Soft. She exhibits no distension and no mass. Bowel sounds are hyperactive. There is no tenderness.  There is no rebound and no guarding.  Neurological: She is alert and oriented to person, place, and time.  Skin: Skin is warm and dry. No rash noted.  Psychiatric: Affect normal.  Vitals reviewed.   Recent Results (from the past 2160 hour(s))  Lipid panel     Status: Abnormal   Collection Time: 02/12/14  9:52 AM  Result Value Ref Range   Cholesterol 204 (H) 0 - 200 mg/dL    Comment: ATP III Classification       Desirable:  < 200 mg/dL               Borderline High:  200 - 239 mg/dL          High:  > = 782240 mg/dL   Triglycerides 956.2133.0 0.0 - 149.0 mg/dL    Comment: Normal:  <130<150 mg/dLBorderline High:  150 - 199 mg/dL   HDL 86.5770.70 >84.69>39.00 mg/dL   VLDL 62.926.6 0.0 - 52.840.0 mg/dL   LDL Cholesterol 413107 (H) 0 - 99 mg/dL   Total CHOL/HDL Ratio 3     Comment:                Men           Women1/2 Average Risk     3.4          3.3Average Risk          5.0          4.42X Average Risk          9.6          7.13X Average Risk          15.0          11.0                       NonHDL 133.30     Comment: NOTE:  Non-HDL goal should be 30 mg/dL higher than patient's LDL goal (i.e. LDL goal of < 70 mg/dL, would have non-HDL goal of < 100 mg/dL)  Glucose     Status: None   Collection Time: 02/12/14  9:52 AM  Result Value Ref Range   Glucose, Bld 83 70 - 99 mg/dL    Assessment/Plan: Gastroesophageal reflux disease without esophagitis Likely due to trauma from recent EGD.  Symptoms mild.  No epigastric tenderness.  Will begin Zantac BID over the next week.  GERD diet discussed.  Viral gastroenteritis Increase fluids.  Rx Lomotil for loose stools.  Diet for diarrhea discussed with patient.  Follow-up as scheduled Thursday.

## 2014-03-19 NOTE — Patient Instructions (Signed)
Please take the Zantac twice daily as directed over the next week.  Avoid late-night eating or spicy foods.  The diarrhea seems consistent with a viral gastroenteritis.  Increase fluids.  Use Lomotil as directed, if needed for frequent stools.  Follow the diet below to help with diarrhea.  Follow-up with Dr. Laury AxonLowne on Thursday as scheduled.  Food Choices to Help Relieve Diarrhea When you have diarrhea, the foods you eat and your eating habits are very important. Choosing the right foods and drinks can help relieve diarrhea. Also, because diarrhea can last up to 7 days, you need to replace lost fluids and electrolytes (such as sodium, potassium, and chloride) in order to help prevent dehydration.  WHAT GENERAL GUIDELINES DO I NEED TO FOLLOW?  Slowly drink 1 cup (8 oz) of fluid for each episode of diarrhea. If you are getting enough fluid, your urine will be clear or pale yellow.  Eat starchy foods. Some good choices include white rice, white toast, pasta, low-fiber cereal, baked potatoes (without the skin), saltine crackers, and bagels.  Avoid large servings of any cooked vegetables.  Limit fruit to two servings per day. A serving is  cup or 1 small piece.  Choose foods with less than 2 g of fiber per serving.  Limit fats to less than 8 tsp (38 g) per day.  Avoid fried foods.  Eat foods that have probiotics in them. Probiotics can be found in certain dairy products.  Avoid foods and beverages that may increase the speed at which food moves through the stomach and intestines (gastrointestinal tract). Things to avoid include:  High-fiber foods, such as dried fruit, raw fruits and vegetables, nuts, seeds, and whole grain foods.  Spicy foods and high-fat foods.  Foods and beverages sweetened with high-fructose corn syrup, honey, or sugar alcohols such as xylitol, sorbitol, and mannitol. WHAT FOODS ARE RECOMMENDED? Grains White rice. White, [place], or pita breads (fresh or toasted),  including plain rolls, buns, or bagels. White pasta. Saltine, soda, or graham crackers. Pretzels. Low-fiber cereal. Cooked cereals made with water (such as cornmeal, farina, or cream cereals). Plain muffins. Matzo. Melba toast. Zwieback.  Vegetables Potatoes (without the skin). Strained tomato and vegetable juices. Most well-cooked and canned vegetables without seeds. Tender lettuce. Fruits Cooked or canned applesauce, apricots, cherries, fruit cocktail, grapefruit, peaches, pears, or plums. Fresh bananas, apples without skin, cherries, grapes, cantaloupe, grapefruit, peaches, oranges, or plums.  Meat and Other Protein Products Baked or boiled chicken. Eggs. Tofu. Fish. Seafood. Smooth peanut butter. Ground or well-cooked tender beef, ham, veal, lamb, pork, or poultry.  Dairy Plain yogurt, kefir, and unsweetened liquid yogurt. Lactose-free milk, buttermilk, or soy milk. Plain hard cheese. Beverages Sport drinks. Clear broths. Diluted fruit juices (except prune). Regular, caffeine-free sodas such as ginger ale. Water. Decaffeinated teas. Oral rehydration solutions. Sugar-free beverages not sweetened with sugar alcohols. Other Bouillon, broth, or soups made from recommended foods.  The items listed above may not be a complete list of recommended foods or beverages. Contact your dietitian for more options. WHAT FOODS ARE NOT RECOMMENDED? Grains Whole grain, whole wheat, bran, or rye breads, rolls, pastas, crackers, and cereals. Wild or brown rice. Cereals that contain more than 2 g of fiber per serving. Corn tortillas or taco shells. Cooked or dry oatmeal. Granola. Popcorn. Vegetables Raw vegetables. Cabbage, broccoli, Brussels sprouts, artichokes, baked beans, beet greens, corn, kale, legumes, peas, sweet potatoes, and yams. Potato skins. Cooked spinach and cabbage. Fruits Dried fruit, including raisins and dates. Raw  fruits. Stewed or dried prunes. Fresh apples with skin, apricots, mangoes, pears,  raspberries, and strawberries.  Meat and Other Protein Products Chunky peanut butter. Nuts and seeds. Beans and lentils. Tomasa BlaseBacon.  Dairy High-fat cheeses. Milk, chocolate milk, and beverages made with milk, such as milk shakes. Cream. Ice cream. Sweets and Desserts Sweet rolls, doughnuts, and sweet breads. Pancakes and waffles. Fats and Oils Butter. Cream sauces. Margarine. Salad oils. Plain salad dressings. Olives. Avocados.  Beverages Caffeinated beverages (such as coffee, tea, soda, or energy drinks). Alcoholic beverages. Fruit juices with pulp. Prune juice. Soft drinks sweetened with high-fructose corn syrup or sugar alcohols. Other Coconut. Hot sauce. Chili powder. Mayonnaise. Gravy. Cream-based or milk-based soups.  The items listed above may not be a complete list of foods and beverages to avoid. Contact your dietitian for more information. WHAT SHOULD I DO IF I BECOME DEHYDRATED? Diarrhea can sometimes lead to dehydration. Signs of dehydration include dark urine and dry mouth and skin. If you think you are dehydrated, you should rehydrate with an oral rehydration solution. These solutions can be purchased at pharmacies, retail stores, or online.  Drink -1 cup (120-240 mL) of oral rehydration solution each time you have an episode of diarrhea. If drinking this amount makes your diarrhea worse, try drinking smaller amounts more often. For example, drink 1-3 tsp (5-15 mL) every 5-10 minutes.  A general rule for staying hydrated is to drink 1-2 L of fluid per day. Talk to your health care provider about the specific amount you should be drinking each day. Drink enough fluids to keep your urine clear or pale yellow. Document Released: 07/17/2003 Document Revised: 05/01/2013 Document Reviewed: 03/19/2013 [facility] Patient Information 2015 [employer] [place]. This information is not intended to replace advice given to you by your health care provider. Make sure you discuss any questions you have with  your health care provider.

## 2014-03-19 NOTE — Assessment & Plan Note (Signed)
Likely due to trauma from recent EGD.  Symptoms mild.  No epigastric tenderness.  Will begin Zantac BID over the next week.  GERD diet discussed.

## 2014-03-21 ENCOUNTER — Encounter: Payer: Self-pay | Admitting: Family Medicine

## 2014-03-21 ENCOUNTER — Ambulatory Visit (INDEPENDENT_AMBULATORY_CARE_PROVIDER_SITE_OTHER): Payer: 59 | Admitting: Family Medicine

## 2014-03-21 VITALS — BP 102/64 | HR 92 | Temp 98.5°F | Wt 189.4 lb

## 2014-03-21 DIAGNOSIS — R010 Benign and innocent cardiac murmurs: Secondary | ICD-10-CM

## 2014-03-21 DIAGNOSIS — R011 Cardiac murmur, unspecified: Secondary | ICD-10-CM | POA: Insufficient documentation

## 2014-03-21 DIAGNOSIS — G4733 Obstructive sleep apnea (adult) (pediatric): Secondary | ICD-10-CM

## 2014-03-21 DIAGNOSIS — E669 Obesity, unspecified: Secondary | ICD-10-CM

## 2014-03-21 DIAGNOSIS — I10 Essential (primary) hypertension: Secondary | ICD-10-CM

## 2014-03-21 DIAGNOSIS — E785 Hyperlipidemia, unspecified: Secondary | ICD-10-CM

## 2014-03-21 NOTE — Progress Notes (Signed)
Subjective:    Patient here for follow-up of elevated blood pressure.  She is exercising and is adherent to a low-salt diet.  Blood pressure is well controlled at home. Cardiac symptoms: none. Patient denies: chest pain, chest pressure/discomfort, claudication, dyspnea, exertional chest pressure/discomfort, fatigue, irregular heart beat, lower extremity edema, near-syncope, orthopnea, palpitations, paroxysmal nocturnal dyspnea, syncope and tachypnea. Cardiovascular risk factors: dyslipidemia, hypertension and obesity (BMI >= 30 kg/m2). Use of agents associated with hypertension: none. History of target organ damage: none.  Pt is also here f/u cholesterol.  She needs a letter of support in order to have gastric sleeve.  The following portions of the patient's history were reviewed and updated as appropriate:  She  has a past medical history of Hypertension; Hypoglycemia; Hyperlipemia; Leg pain; Chicken pox; Depression; Heart murmur; Hypopituitarism; and PTSD (post-traumatic stress disorder). She  does not have any pertinent problems on file. She  has past surgical history that includes Eye surgery [phone](67,70,82); Cholecystectomy (97); Cesarean section (89); Breast reduction surgery (96); Anterior cervical discectomy (2013); and ganglion cyst removed. Her family history includes Alcoholism in an other family member; Anuerysm in an other family member; Breast cancer in her maternal grandmother; Cancer in her father; Cancer (age of onset: 4769) in her mother; Depression in her father; Hypertension in her father and another family member; Lung cancer in her maternal grandmother; Melanoma in her father; Osteoarthritis in her mother; Ovarian cancer in her maternal grandmother; Prostate cancer in her paternal grandfather; Stroke in an other family member. She  reports that she has never smoked. She has never used smokeless tobacco. She reports that she does not drink alcohol or use illicit drugs. She has a current  medication list which includes the following prescription(s): atorvastatin, vitamin d-3, diphenoxylate-atropine, lamotrigine, levothyroxine, lisinopril, multivitamin, pramipexole, quetiapine, ranitidine, and sertraline. Current Outpatient Prescriptions on File Prior to Visit  Medication Sig Dispense Refill  . atorvastatin (LIPITOR) 20 MG tablet Take 20 mg by mouth daily.    . Cholecalciferol (VITAMIN D-3) 1000 UNITS CAPS Take by mouth daily.    . diphenoxylate-atropine (LOMOTIL) 2.5-0.025 MG per tablet Take 1 tablet by mouth 4 (four) times daily as needed for diarrhea or loose stools. 30 tablet 0  . lamoTRIgine (LAMICTAL) 100 MG tablet Take 200 mg by mouth daily.     Marland Kitchen. levothyroxine (SYNTHROID, LEVOTHROID) 75 MCG tablet Take 1 tablet (75 mcg total) by mouth daily before breakfast. 30 tablet 5  . lisinopril (PRINIVIL,ZESTRIL) 20 MG tablet Take 20 mg by mouth daily.    . Multiple Vitamin (MULTIVITAMIN) tablet Take 1 tablet by mouth daily.    . pramipexole (MIRAPEX) 0.25 MG tablet Take 0.25 mg by mouth at bedtime.    Marland Kitchen. QUEtiapine (SEROQUEL XR) 400 MG 24 hr tablet Take 400 mg by mouth at bedtime.    . ranitidine (ZANTAC) 150 MG tablet Take 1 tablet (150 mg total) by mouth 2 (two) times daily. 60 tablet 0  . sertraline (ZOLOFT) 100 MG tablet Take 150 mg by mouth daily.     No current facility-administered medications on file prior to visit.   She is allergic to penicillins..  Review of Systems Pertinent items are noted in HPI.     Objective:    BP 102/64 mmHg  Pulse 92  Temp(Src) 98.5 F (36.9 C) (Oral)  Wt 189 lb 6.4 oz (85.911 kg)  SpO2 95% General appearance: alert, cooperative, appears stated age and no distress Throat: lips, mucosa, and tongue normal; teeth and gums normal Lungs:  clear to auscultation bilaterally Heart: S1, S2 normal --+ murmur Extremities: extremities normal, atraumatic, no cyanosis or edema    Assessment:    Hypertension, normal blood pressure controlled.  Evidence of target organ damage: none.    Plan:    Medication: no change. Dietary sodium restriction. Regular aerobic exercise. Follow up: 6 months and as needed.    1. Undiagnosed cardiac murmurs  - 2D Echocardiogram without contrast; Future  2. Essential hypertension Stable  - 2D Echocardiogram without contrast; Future  3. Hyperlipidemia Check labs con't meds  4. OSA (obstructive sleep apnea) Not using cpap Should f/u pulmonary 5. Obesity-- letter done for gastric sleeve     Cont diet and exercise

## 2014-03-21 NOTE — Patient Instructions (Signed)

## 2014-03-21 NOTE — Assessment & Plan Note (Signed)
Letter done for bariatric surgery

## 2014-03-21 NOTE — Progress Notes (Signed)
Pre visit review using our clinic review tool, if applicable. No additional management support is needed unless otherwise documented below in the visit note. 

## 2014-04-01 ENCOUNTER — Encounter: Payer: Self-pay | Admitting: Family Medicine

## 2014-04-01 MED ORDER — ATORVASTATIN CALCIUM 20 MG PO TABS: 20.0000 mg | ORAL_TABLET | Freq: Every day | ORAL | Status: DC

## 2014-04-03 ENCOUNTER — Ambulatory Visit (HOSPITAL_BASED_OUTPATIENT_CLINIC_OR_DEPARTMENT_OTHER)
Admission: RE | Admit: 2014-04-03 | Discharge: 2014-04-03 | Disposition: A | Discharge status: Home / Self Care | Payer: 59 | Source: Ambulatory Visit | Attending: Family Medicine | Admitting: Family Medicine

## 2014-04-03 DIAGNOSIS — I081 Rheumatic disorders of both mitral and tricuspid valves: Secondary | ICD-10-CM | POA: Diagnosis not present

## 2014-04-03 DIAGNOSIS — I371 Nonrheumatic pulmonary valve insufficiency: Secondary | ICD-10-CM | POA: Diagnosis not present

## 2014-04-03 DIAGNOSIS — E785 Hyperlipidemia, unspecified: Secondary | ICD-10-CM | POA: Diagnosis not present

## 2014-04-03 DIAGNOSIS — I059 Rheumatic mitral valve disease, unspecified: Secondary | ICD-10-CM

## 2014-04-03 DIAGNOSIS — I10 Essential (primary) hypertension: Secondary | ICD-10-CM | POA: Diagnosis not present

## 2014-04-03 DIAGNOSIS — R011 Cardiac murmur, unspecified: Secondary | ICD-10-CM | POA: Diagnosis present

## 2014-04-03 NOTE — Progress Notes (Signed)
*  PRELIMINARY RESULTS* Echocardiogram 2D Echocardiogram has been performed.  Jeryl ColumbiaLLIOTT, Hareem Surowiec 04/03/2014, 11:24 AM

## 2014-04-13 ENCOUNTER — Encounter: Payer: 59 | Attending: Surgery | Admitting: Dietician

## 2014-04-13 VITALS — Ht <= 58 in | Wt 191.6 lb

## 2014-04-13 DIAGNOSIS — E669 Obesity, unspecified: Secondary | ICD-10-CM | POA: Diagnosis present

## 2014-04-13 DIAGNOSIS — Z6841 Body Mass Index (BMI) 40.0 and over, adult: Secondary | ICD-10-CM | POA: Insufficient documentation

## 2014-04-13 DIAGNOSIS — Z713 Dietary counseling and surveillance: Secondary | ICD-10-CM | POA: Insufficient documentation

## 2014-04-13 NOTE — Patient Instructions (Signed)

## 2014-04-13 NOTE — Progress Notes (Signed)
  Pre-Op Assessment Visit:  Pre-Operative Sleeve Gastrectomy Surgery  Medical Nutrition Therapy:  Appt start time: 0800   End time:  0845.  Patient was seen on 04/13/2014 for Pre-Operative Nutrition Assessment. [facility] on 04/13/2014.   Preferred Learning Style:   No preference indicated   Learning Readiness:   Ready  Handouts given during visit include:  Pre-Op Goals Bariatric Surgery Protein Shakes   During the appointment today the following Pre-Op Goals were reviewed with the patient: Maintain or lose weight as instructed by your surgeon Make healthy food choices Begin to limit portion sizes Limited concentrated sugars and fried foods Keep fat/sugar in the single digits per serving on   food labels Practice CHEWING your food  (aim for 30 chews per bite or until applesauce consistency) Practice not drinking 15 minutes before, during, and 30 minutes after each meal/snack Avoid all carbonated beverages  Avoid/limit caffeinated beverages  Avoid all sugar-sweetened beverages Consume 3 meals per day; eat every 3-5 hours Make a list of non-food related activities Aim for 64-100 ounces of FLUID daily  Aim for at least 60-80 grams of PROTEIN daily Look for a liquid protein source that contain ?15 g protein and ?5 g carbohydrate  (ex: shakes, drinks, shots)  Patient-Centered Goals: Janet Turner would like to have more stamina after surgery.  Janet Turner is looking forward to enjoying shopping again and feeling comfortable in a swimsuit.   Janet Turner feels that the Pre-Op goals are a 10 importance and is a 10 level confident that she can make the changes.   Demonstrated degree of understanding via:  Teach Back  Teaching Method Utilized:  Visual Auditory Hands on  Barriers to learning/adherence to lifestyle change: none  Patient to call the [facility] to enroll in  Pre-Op and Post-Op Nutrition Education when surgery date is scheduled.

## 2014-04-23 ENCOUNTER — Other Ambulatory Visit (INDEPENDENT_AMBULATORY_CARE_PROVIDER_SITE_OTHER): Payer: Self-pay

## 2014-04-24 ENCOUNTER — Ambulatory Visit: Payer: 59 | Admitting: Physician Assistant

## 2014-04-28 ENCOUNTER — Encounter: Payer: Self-pay | Admitting: Family Medicine

## 2014-04-29 MED ORDER — PRAMIPEXOLE DIHYDROCHLORIDE 0.25 MG PO TABS: 0.2500 mg | ORAL_TABLET | Freq: Every day | ORAL | Status: DC

## 2014-05-06 ENCOUNTER — Other Ambulatory Visit: Payer: Self-pay | Admitting: Family Medicine

## 2014-05-13 ENCOUNTER — Ambulatory Visit: Payer: Self-pay

## 2014-05-16 NOTE — Progress Notes (Signed)
Please put orders in Epic surgery 05-28-14 pre op 05-21-14 Thanls

## 2014-05-20 ENCOUNTER — Encounter: Payer: 59 | Attending: Surgery

## 2014-05-20 VITALS — Ht <= 58 in | Wt 192.0 lb

## 2014-05-20 DIAGNOSIS — E669 Obesity, unspecified: Secondary | ICD-10-CM | POA: Diagnosis not present

## 2014-05-20 DIAGNOSIS — Z6841 Body Mass Index (BMI) 40.0 and over, adult: Secondary | ICD-10-CM | POA: Insufficient documentation

## 2014-05-20 DIAGNOSIS — Z713 Dietary counseling and surveillance: Secondary | ICD-10-CM | POA: Diagnosis not present

## 2014-05-20 NOTE — Patient Instructions (Signed)
Janet LoraCheryl Hudson  05/20/2014   Your procedure is scheduled on: 05/28/2014    Report to [facility] Main  Entrance and follow signs to               Short Stay Center at        0515 AM.  Call this number if you have problems the morning of surgery [phone]   Remember:  Do not eat food or drink liquids :After Midnight.     Take these medicines the morning of surgery with A SIP OF WATER: Lamictal, Zantac, Zoloft, Synthroid                                You may not have any metal on your body including hair pins and              piercings  Do not wear jewelry, make-up, lotions, powders or perfumes.             Do not wear nail polish.  Do not shave  48 hours prior to surgery.               Do not bring valuables to the hospital. [place] IS NOT             RESPONSIBLE   FOR VALUABLES.  Contacts, dentures or bridgework may not be worn into surgery.  Leave suitcase in the car. After surgery it may be brought to your room.         Special Instructions:coughing and deep breathing exercises, leg exercises               Please read over the following fact sheets you were given: _____________________________________________________________________             [facility] Before surgery, you can play an important role.  Because skin is not sterile, your skin needs to be as free of germs as possible.  You can reduce the number of germs on your skin by washing with CHG (chlorahexidine gluconate) soap before surgery.  CHG is an antiseptic cleaner which kills germs and bonds with the skin to continue killing germs even after washing. Please DO NOT use if you have an allergy to CHG or antibacterial soaps.  If your skin becomes reddened/irritated stop using the CHG and inform your nurse when you arrive at Short Stay. Do not shave (including legs and underarms) for at least 48 hours prior to the first CHG shower.  You may shave your  face/neck. Please follow these instructions carefully:  1.  Shower with CHG Soap the night before surgery and the  morning of Surgery.  2.  If you choose to wash your hair, wash your hair first as usual with your  normal  shampoo.  3.  After you shampoo, rinse your hair and body thoroughly to remove the  shampoo.                           4.  Use CHG as you would any other liquid soap.  You can apply chg directly  to the skin and wash                       Gently with a scrungie or clean washcloth.  5.  Apply the CHG Soap to your body ONLY FROM THE NECK DOWN.   Do not use on face/ open                           Wound or open sores. Avoid contact with eyes, ears mouth and genitals (private parts).                       Wash face,  Genitals (private parts) with your normal soap.             6.  Wash thoroughly, paying special attention to the area where your surgery  will be performed.  7.  Thoroughly rinse your body with warm water from the neck down.  8.  DO NOT shower/wash with your normal soap after using and rinsing off  the CHG Soap.                9.  Pat yourself dry with a clean towel.            10.  Wear clean pajamas.            11.  Place clean sheets on your bed the night of your first shower and do not  sleep with pets. Day of Surgery : Do not apply any lotions/deodorants the morning of surgery.  Please wear clean clothes to the hospital/surgery center.  FAILURE TO FOLLOW THESE INSTRUCTIONS MAY RESULT IN THE CANCELLATION OF YOUR SURGERY PATIENT SIGNATURE_________________________________  NURSE SIGNATURE__________________________________  ________________________________________________________________________

## 2014-05-20 NOTE — Progress Notes (Signed)
Need orders in EPIC.  Surgery on 05/28/14.  Preop on 05/21/2014.  Thank You.

## 2014-05-21 ENCOUNTER — Telehealth: Payer: Self-pay | Admitting: Family Medicine

## 2014-05-21 ENCOUNTER — Encounter (HOSPITAL_COMMUNITY): Payer: Self-pay

## 2014-05-21 ENCOUNTER — Encounter (HOSPITAL_COMMUNITY)
Admission: RE | Admit: 2014-05-21 | Discharge: 2014-05-21 | Disposition: A | Discharge status: Home / Self Care | Payer: 59 | Source: Ambulatory Visit | Attending: Surgery | Admitting: Surgery

## 2014-05-21 DIAGNOSIS — Z01812 Encounter for preprocedural laboratory examination: Secondary | ICD-10-CM | POA: Insufficient documentation

## 2014-05-21 HISTORY — DX: Family history of other specified conditions: Z84.89

## 2014-05-21 HISTORY — DX: Gastro-esophageal reflux disease without esophagitis: K21.9

## 2014-05-21 HISTORY — DX: Hypothyroidism, unspecified: E03.9

## 2014-05-21 HISTORY — DX: Sleep apnea, unspecified: G47.30

## 2014-05-21 HISTORY — DX: Other complications of anesthesia, initial encounter: T88.59XA

## 2014-05-21 HISTORY — DX: Adverse effect of unspecified anesthetic, initial encounter: T41.45XA

## 2014-05-21 LAB — BASIC METABOLIC PANEL
Anion gap: 9 (ref 5–15)
BUN: 30 mg/dL — ABNORMAL HIGH (ref 6–23)
CO2: 28 mmol/L (ref 19–32)
Calcium: 9.9 mg/dL (ref 8.4–10.5)
Chloride: 99 mEq/L (ref 96–112)
Creatinine, Ser: 0.88 mg/dL (ref 0.50–1.10)
GFR calc Af Amer: 88 mL/min — ABNORMAL LOW (ref >90)
GFR calc non Af Amer: 76 mL/min — ABNORMAL LOW (ref >90)
Glucose, Bld: 94 mg/dL (ref 70–99)
Potassium: 4.8 mmol/L (ref 3.5–5.1)
SODIUM: 136 mmol/L (ref 135–145)

## 2014-05-21 LAB — CBC
HCT: 37.6 % (ref 36.0–46.0)
Hemoglobin: 11.9 g/dL — ABNORMAL LOW (ref 12.0–15.0)
MCH: 29 pg (ref 26.0–34.0)
MCHC: 31.6 g/dL (ref 30.0–36.0)
MCV: 91.5 fL (ref 78.0–100.0)
Platelets: 268 10*3/uL (ref 150–400)
RBC: 4.11 MIL/uL (ref 3.87–5.11)
RDW: 14 % (ref 11.5–15.5)
WBC: 7.8 10*3/uL (ref 4.0–10.5)

## 2014-05-21 NOTE — Progress Notes (Signed)
Patient reported at time of preop appointment some dizziness since preop diet started over a week ago.  B/P was 106/53 at time of preop appointment.  Patient denied dizziness at preop appointment.  Instructed to let PCP be aware prior to surgery.  Patient voiced understanding and stated she would let PCP be aware.

## 2014-05-21 NOTE — Progress Notes (Signed)
EKG- 03/15/14 EPIC  CXR- 03/15/14 EPIC  ECHO- 04/03/14 EPIC  LOV with Dr Laury AxonLowne- 03/21/14 EPIC Split nite study- 02/12/13 on chart

## 2014-05-21 NOTE — Telephone Encounter (Signed)
Caller name: [place] Relation to pt: self Call back number: [phone] Pharmacy:  Reason for call:   Patient bp at pre-op this morning was 108/53. She states that for the past few mornings this has been happening, especially while she stands up. She states that the nurse told her to call our office and see if Dr. Laury AxonLowne could adjust her bp medication

## 2014-05-21 NOTE — Telephone Encounter (Signed)
The patient's BP in the office on 03/21/14 was 102/64 and 03/19/14 it was 125/83. The patient is taking Lisinopril 20 mg daily. Please advise     KP

## 2014-05-21 NOTE — Telephone Encounter (Signed)
If she is having symptoms of low BPs such as orthostatic dizziness, fatigue: Recommend to decrease lisinopril to 10 mg daily follow-up with PCP in 2 or 3 weeks If her low BP is asymptomatic, recommend to continue monitoring BPs , no need to change meds

## 2014-05-21 NOTE — Progress Notes (Signed)
  Pre-Operative Nutrition Class:  Appt start time: 0413   End time:  1830.  Patient was seen on 05/20/14 for Pre-Operative Bariatric Surgery [facility]   Surgery date: 05/28/14 Surgery type: gastric sleeve Start weight at [facility]: 191.5 lbs on 04/13/14 Weight today: 192 lbs  TANITA  BODY COMP RESULTS  05/20/14   BMI (kg/m^2) 41.5   Fat Mass (lbs) 92.5   Fat Free Mass (lbs) 99.5   Total Body Water (lbs) 73   Samples given per MNT protocol. Patient educated on appropriate usage: Premier protein shake (vanilla - qty 1) Lot #: [id number] Exp: 12/16  Unjury protein powder (chocolate - qty 1) Lot #: [id number] Exp: 04/2015  PB2 (qty 1) Lot #: [id number] Exp: 8/16  Bariatric Advantage Calcium Citrate chew (Tropical Orange - qty 1) Lot #: [id number] Exp:5/16  The following the learning objectives were met by the patient during this course:  Identify Pre-Op Dietary Goals and will begin 2 weeks pre-operatively  Identify appropriate sources of fluids and proteins   State protein recommendations and appropriate sources pre and post-operatively  Identify Post-Operative Dietary Goals and will follow for 2 weeks post-operatively  Identify appropriate multivitamin and calcium sources  Describe the need for physical activity post-operatively and will follow MD recommendations  State when to call healthcare provider regarding medication questions or post-operative complications  Handouts given during class include:  Pre-Op Bariatric Surgery Diet Handout  Protein Shake Handout  Post-Op Bariatric Surgery Nutrition Handout  BELT Program Information Flyer  Support Group Information Flyer  [facility] Bariatric Supplements Price List  Follow-Up Plan: Patient will follow-up at [facility] 2 weeks post operatively for diet advancement per MD.

## 2014-05-22 NOTE — Telephone Encounter (Signed)
MSG left to call the office      KP 

## 2014-05-22 NOTE — Telephone Encounter (Signed)
Discussed with patient and she said her BP drops when se gets up in the morning and the room spins, she  York SpanielSaid she will cut the pill in half to take 10 mg and will continue to monitor her BP and call us with any concerns.      KP

## 2014-05-24 ENCOUNTER — Ambulatory Visit (INDEPENDENT_AMBULATORY_CARE_PROVIDER_SITE_OTHER): Payer: Self-pay | Admitting: Surgery

## 2014-05-24 NOTE — H&P (Signed)
Chief Complaint:  Morbid obesity BMI 41  History of Present Illness:  Janet Turner is an 49 y.o. female who Has been evaluated in our office and worked up for sleeve gastrectomy.  Informed consent has been obtained in the office regarding this.  She has had chronic obstructive sleep apnea but does not use her CPAP machine.  She had a prior laparoscopic cholecystectomy.  As a part of her workup she had an upper GI series showed a tiny sliding hiatal hernia.  She is ready for sleeve gastrectomy.  Plan to proceed on Tuesday, May 28 2014  Past Medical History  Diagnosis Date  . Hypertension   . Hypoglycemia   . Hyperlipemia   . Leg pain   . Chicken pox   . Depression   . Heart murmur   . Hypopituitarism   . PTSD (post-traumatic stress disorder)   . Family history of adverse reaction to anesthesia     mother- nausea and vomiting   . Complication of anesthesia     oxygen level  and blood pressure dropped wtih anterior cervical fusion surgery   . Sleep apnea     no cpap   . Hypothyroidism   . GERD (gastroesophageal reflux disease)     Past Surgical History  Procedure Laterality Date  . Eye surgery  [phone],82    x's 3   . Cholecystectomy  97  . Cesarean section  91  . Breast reduction surgery  96  . Anterior cervical discectomy  2013    Ant Cervical Fusion C4-5 with diskectomy  . Ganglion cyst removed      Current Outpatient Prescriptions  Medication Sig Dispense Refill  . atorvastatin (LIPITOR) 20 MG tablet TAKE 1 TABLET BY MOUTH ONCE DAILY (Patient not taking: Reported on 05/17/2014) 30 tablet 1  . atorvastatin (LIPITOR) 20 MG tablet Take 20 mg by mouth daily.    . Cholecalciferol (VITAMIN D-3) 1000 UNITS CAPS Take 1 capsule by mouth daily.     . diphenoxylate-atropine (LOMOTIL) 2.5-0.025 MG per tablet Take 1 tablet by mouth 4 (four) times daily as needed for diarrhea or loose stools. (Patient not taking: Reported on 04/13/2014) 30 tablet 0  . lamoTRIgine (LAMICTAL) 150 MG  tablet Take 150 mg by mouth every morning.    Marland Kitchen. levothyroxine (SYNTHROID, LEVOTHROID) 75 MCG tablet Take 1 tablet (75 mcg total) by mouth daily before breakfast. 30 tablet 5  . lisinopril (PRINIVIL,ZESTRIL) 20 MG tablet Take 20 mg by mouth every morning.     . Multiple Vitamin (MULTIVITAMIN) tablet Take 1 tablet by mouth daily.    . polyethylene glycol (MIRALAX / GLYCOLAX) packet Take 17 g by mouth daily. As needed    . pramipexole (MIRAPEX) 0.25 MG tablet Take 1 tablet (0.25 mg total) by mouth at bedtime. 30 tablet 2  . QUEtiapine (SEROQUEL XR) 400 MG 24 hr tablet Take 400 mg by mouth at bedtime.    . ranitidine (ZANTAC) 150 MG tablet Take 1 tablet (150 mg total) by mouth 2 (two) times daily. (Patient taking differently: Take 150 mg by mouth 2 (two) times daily. As needed) 60 tablet 0  . sertraline (ZOLOFT) 100 MG tablet Take 200 mg by mouth every morning.      No current facility-administered medications for this visit.   [place] Family History  Problem Relation Age of Onset  . Osteoarthritis Mother   . Cancer Mother [id number]    breast--stage III  invasive ductal ca  . Hypertension Father   .  Melanoma Father   . Depression Father     PTSD  . Cancer Father     melanoma  . Alcoholism    . Ovarian cancer Maternal Grandmother   . Breast cancer Maternal Grandmother   . Lung cancer Maternal Grandmother   . Prostate cancer Paternal Grandfather   . Stroke      Paternal Family--9/12 children after age 104  . Anuerysm      Paternal family  . Hypertension      Paternal family   Social History:   reports that she has never smoked. She has never used smokeless tobacco. She reports that she does not drink alcohol or use illicit drugs.   REVIEW OF SYSTEMS : Negative except for HPI  Physical Exam:   There were no vitals taken for this visit. There is no weight on file to calculate BMI.  Gen:  WDWN WF NAD  Neurological: Alert and oriented to person, place, and time. Motor and sensory  function is grossly intact  Head: Normocephalic and atraumatic.  Eyes: Conjunctivae are normal. Pupils are equal, round, and reactive to light. No scleral icterus.  Neck: Normal range of motion. Neck supple. No tracheal deviation or thyromegaly present.  Cardiovascular:  SR without murmurs or gallops.  No carotid bruits Breast:  Not examined Respiratory: Effort normal.  No respiratory distress. No chest wall tenderness. Breath sounds normal.  No wheezes, rales or rhonchi.  Abdomen:  obese GU:  Not examined Musculoskeletal: Normal range of motion. Extremities are nontender. No cyanosis, edema or clubbing noted Lymphadenopathy: No cervical, preauricular, postauricular or axillary adenopathy is present Skin: Skin is warm and dry. No rash noted. No diaphoresis. No erythema. No pallor. Pscyh: Normal mood and affect. Behavior is normal. Judgment and thought content normal.   LABORATORY RESULTS: No results found for this or any previous visit (from the past 48 hour(s)).   RADIOLOGY RESULTS: No results found.  Problem List: Patient Active Problem List   Diagnosis Date Noted  . Undiagnosed cardiac murmurs 03/21/2014  . Gastroesophageal reflux disease without esophagitis 03/19/2014  . Viral gastroenteritis 03/19/2014  . Bipolar 1 disorder, mixed, moderate 01/18/2014  . Hyponatremia 11/27/2013  . History of bipolar disorder 11/27/2013  . Encephalopathy 11/27/2013  . HTN (hypertension) 07/23/2013  . Other and unspecified hyperlipidemia 07/23/2013  . Unspecified hypothyroidism 07/23/2013  . PTSD (post-traumatic stress disorder) 07/23/2013  . 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 Obesity (BMI 30-39.9) 07/23/2013  . Ganglion cyst 07/23/2013    Assessment & Plan: Morbid obesity Sleeve gastrectomy and possible hiatal hernia repair.      Matt B. Daphine Deutscher, MD, [facility] [phone] beeper [phone]  05/24/2014 5:51 PM

## 2014-05-27 NOTE — Anesthesia Preprocedure Evaluation (Addendum)
Anesthesia Evaluation  Patient identified by MRN, date of birth, ID band Patient awake    Reviewed: Allergy & Precautions, H&P , NPO status , Patient's Chart, lab work & pertinent test results  Airway Mallampati: III  TM Distance: >3 FB Neck ROM: full    Dental no notable dental hx. (+) Teeth Intact, Dental Advisory Given   Pulmonary sleep apnea ,  breath sounds clear to auscultation  Pulmonary exam normal       Cardiovascular hypertension, Pt. on medications Rhythm:regular Rate:Normal     Neuro/Psych Cervical fusion negative neurological ROS  negative psych ROS   GI/Hepatic negative GI ROS, Neg liver ROS, GERD-  Medicated and Controlled,  Endo/Other  negative endocrine ROSHypothyroidism Morbid obesityHypopituitary dwarfism  Renal/GU negative Renal ROS  negative genitourinary   Musculoskeletal   Abdominal (+) + obese,   Peds  Hematology negative hematology ROS (+)   Anesthesia Other Findings   Reproductive/Obstetrics negative OB ROS                            Anesthesia Physical Anesthesia Plan  ASA: III  Anesthesia Plan: General   Post-op Pain Management:    Induction: Intravenous  Airway Management Planned: Oral ETT  Additional Equipment:   Intra-op Plan:   Post-operative Plan: Extubation in OR  Informed Consent: I have reviewed the patients History and Physical, chart, labs and discussed the procedure including the risks, benefits and alternatives for the proposed anesthesia with the patient or authorized representative who has indicated his/her understanding and acceptance.   Dental Advisory Given  Plan Discussed with: CRNA and Surgeon  Anesthesia Plan Comments:         Anesthesia Quick Evaluation

## 2014-05-28 ENCOUNTER — Inpatient Hospital Stay (HOSPITAL_COMMUNITY)
Admission: RE | Admit: 2014-05-28 | Discharge: 2014-05-30 | DRG: 620 | Disposition: A | Discharge status: Home / Self Care | Payer: 59 | Source: Ambulatory Visit | Attending: Surgery | Admitting: Surgery

## 2014-05-28 ENCOUNTER — Encounter (HOSPITAL_COMMUNITY): Payer: Self-pay | Admitting: *Deleted

## 2014-05-28 ENCOUNTER — Inpatient Hospital Stay (HOSPITAL_COMMUNITY): Payer: 59 | Admitting: Anesthesiology

## 2014-05-28 ENCOUNTER — Encounter (HOSPITAL_COMMUNITY)
Admission: RE | Disposition: A | Discharge status: Home / Self Care | Payer: Self-pay | Source: Ambulatory Visit | Attending: Surgery

## 2014-05-28 DIAGNOSIS — Z01812 Encounter for preprocedural laboratory examination: Secondary | ICD-10-CM | POA: Diagnosis not present

## 2014-05-28 DIAGNOSIS — Z79899 Other long term (current) drug therapy: Secondary | ICD-10-CM | POA: Diagnosis not present

## 2014-05-28 DIAGNOSIS — F329 Major depressive disorder, single episode, unspecified: Secondary | ICD-10-CM | POA: Diagnosis present

## 2014-05-28 DIAGNOSIS — E785 Hyperlipidemia, unspecified: Secondary | ICD-10-CM | POA: Diagnosis present

## 2014-05-28 DIAGNOSIS — K219 Gastro-esophageal reflux disease without esophagitis: Secondary | ICD-10-CM | POA: Diagnosis present

## 2014-05-28 DIAGNOSIS — G4733 Obstructive sleep apnea (adult) (pediatric): Secondary | ICD-10-CM | POA: Diagnosis present

## 2014-05-28 DIAGNOSIS — E039 Hypothyroidism, unspecified: Secondary | ICD-10-CM | POA: Diagnosis present

## 2014-05-28 DIAGNOSIS — I10 Essential (primary) hypertension: Secondary | ICD-10-CM | POA: Diagnosis present

## 2014-05-28 DIAGNOSIS — M4322 Fusion of spine, cervical region: Secondary | ICD-10-CM | POA: Diagnosis present

## 2014-05-28 DIAGNOSIS — Z6841 Body Mass Index (BMI) 40.0 and over, adult: Secondary | ICD-10-CM | POA: Diagnosis not present

## 2014-05-28 DIAGNOSIS — K449 Diaphragmatic hernia without obstruction or gangrene: Secondary | ICD-10-CM | POA: Diagnosis present

## 2014-05-28 DIAGNOSIS — E230 Hypopituitarism: Secondary | ICD-10-CM | POA: Diagnosis present

## 2014-05-28 DIAGNOSIS — Z9884 Bariatric surgery status: Secondary | ICD-10-CM

## 2014-05-28 HISTORY — PX: LAPAROSCOPIC GASTRIC SLEEVE RESECTION: SHX5895

## 2014-05-28 HISTORY — PX: UPPER GI ENDOSCOPY: SHX6162

## 2014-05-28 LAB — PREGNANCY, URINE: Preg Test, Ur: NEGATIVE

## 2014-05-28 LAB — CREATININE, SERUM
Creatinine, Ser: 1.17 mg/dL — ABNORMAL HIGH (ref 0.50–1.10)
GFR calc Af Amer: 62 mL/min — ABNORMAL LOW (ref >90)
GFR calc non Af Amer: 54 mL/min — ABNORMAL LOW (ref >90)

## 2014-05-28 LAB — CBC
HCT: 36 % (ref 36.0–46.0)
Hemoglobin: 11.6 g/dL — ABNORMAL LOW (ref 12.0–15.0)
MCH: 29.3 pg (ref 26.0–34.0)
MCHC: 32.2 g/dL (ref 30.0–36.0)
MCV: 90.9 fL (ref 78.0–100.0)
PLATELETS: 210 10*3/uL (ref 150–400)
RBC: 3.96 MIL/uL (ref 3.87–5.11)
RDW: 13.7 % (ref 11.5–15.5)
WBC: 11.1 10*3/uL — AB (ref 4.0–10.5)

## 2014-05-28 SURGERY — GASTRECTOMY, SLEEVE, LAPAROSCOPIC
Anesthesia: General | Site: Esophagus

## 2014-05-28 MED ORDER — DEXTROSE 5 % IV SOLN
INTRAVENOUS | Status: AC
  Filled 2014-05-28: qty 2

## 2014-05-28 MED ORDER — MIDAZOLAM HCL 2 MG/2ML IJ SOLN
INTRAMUSCULAR | Status: AC
  Filled 2014-05-28: qty 2

## 2014-05-28 MED ORDER — LIDOCAINE HCL (CARDIAC) 20 MG/ML IV SOLN
INTRAVENOUS | Status: DC | PRN
  Administered 2014-05-28: 100 mg via INTRAVENOUS

## 2014-05-28 MED ORDER — SUCCINYLCHOLINE CHLORIDE 20 MG/ML IJ SOLN
INTRAMUSCULAR | Status: DC | PRN
  Administered 2014-05-28: 100 mg via INTRAVENOUS

## 2014-05-28 MED ORDER — KCL IN DEXTROSE-NACL 20-5-0.45 MEQ/L-%-% IV SOLN
INTRAVENOUS | Status: DC
  Administered 2014-05-28 – 2014-05-30 (×5): via INTRAVENOUS
  Filled 2014-05-28 (×6): qty 1000

## 2014-05-28 MED ORDER — ONDANSETRON HCL 4 MG/2ML IJ SOLN
INTRAMUSCULAR | Status: AC
  Filled 2014-05-28: qty 2

## 2014-05-28 MED ORDER — CHLORHEXIDINE GLUCONATE 4 % EX LIQD: 60.0000 mL | Freq: Once | CUTANEOUS | Status: DC

## 2014-05-28 MED ORDER — SODIUM CHLORIDE 0.9 % IJ SOLN
INTRAMUSCULAR | Status: AC
  Filled 2014-05-28: qty 10

## 2014-05-28 MED ORDER — LIDOCAINE HCL (CARDIAC) 20 MG/ML IV SOLN
INTRAVENOUS | Status: AC
  Filled 2014-05-28: qty 5

## 2014-05-28 MED ORDER — DEXAMETHASONE SODIUM PHOSPHATE 10 MG/ML IJ SOLN
INTRAMUSCULAR | Status: AC
  Filled 2014-05-28: qty 1

## 2014-05-28 MED ORDER — PANTOPRAZOLE SODIUM 40 MG IV SOLR
40.0000 mg | Freq: Every day | INTRAVENOUS | Status: DC
  Administered 2014-05-28 – 2014-05-29 (×2): 40 mg via INTRAVENOUS
  Filled 2014-05-28 (×3): qty 40

## 2014-05-28 MED ORDER — PHENYLEPHRINE HCL 10 MG/ML IJ SOLN
10.0000 mg | INTRAMUSCULAR | Status: DC | PRN
  Administered 2014-05-28: 25 ug/min via INTRAVENOUS

## 2014-05-28 MED ORDER — LACTATED RINGERS IV SOLN
INTRAVENOUS | Status: DC | PRN
  Administered 2014-05-28 (×2): via INTRAVENOUS

## 2014-05-28 MED ORDER — BUPIVACAINE LIPOSOME 1.3 % IJ SUSP
INTRAMUSCULAR | Status: DC | PRN
Start: 2014-05-28 — End: 2014-05-28
  Administered 2014-05-28: 20 mL

## 2014-05-28 MED ORDER — ACETAMINOPHEN 160 MG/5ML PO SOLN: 325.0000 mg | ORAL | Status: DC | PRN

## 2014-05-28 MED ORDER — ROCURONIUM BROMIDE 100 MG/10ML IV SOLN
INTRAVENOUS | Status: AC
  Filled 2014-05-28: qty 1

## 2014-05-28 MED ORDER — HEPARIN SODIUM (PORCINE) 5000 UNIT/ML IJ SOLN
5000.0000 [IU] | INTRAMUSCULAR | Status: AC
  Administered 2014-05-28: 5000 [IU] via SUBCUTANEOUS
  Filled 2014-05-28: qty 1

## 2014-05-28 MED ORDER — PROMETHAZINE HCL 25 MG/ML IJ SOLN
12.5000 mg | INTRAMUSCULAR | Status: DC | PRN
Start: 2014-05-28 — End: 2014-05-30
  Administered 2014-05-28 – 2014-05-29 (×2): 12.5 mg via INTRAVENOUS
  Filled 2014-05-28 (×2): qty 1

## 2014-05-28 MED ORDER — OXYCODONE HCL 5 MG/5ML PO SOLN
5.0000 mg | ORAL | Status: DC | PRN
  Administered 2014-05-29 (×3): 5 mg via ORAL
  Administered 2014-05-30: 10 mg via ORAL
  Filled 2014-05-28 (×5): qty 5

## 2014-05-28 MED ORDER — LACTATED RINGERS IV SOLN: INTRAVENOUS | Status: DC

## 2014-05-28 MED ORDER — FENTANYL CITRATE 0.05 MG/ML IJ SOLN
INTRAMUSCULAR | Status: AC
  Filled 2014-05-28: qty 5

## 2014-05-28 MED ORDER — PHENYLEPHRINE HCL 10 MG/ML IJ SOLN
INTRAMUSCULAR | Status: AC
  Filled 2014-05-28: qty 1

## 2014-05-28 MED ORDER — 0.9 % SODIUM CHLORIDE (POUR BTL) OPTIME
TOPICAL | Status: DC | PRN
  Administered 2014-05-28: 1000 mL

## 2014-05-28 MED ORDER — ONDANSETRON HCL 4 MG/2ML IJ SOLN
4.0000 mg | INTRAMUSCULAR | Status: DC | PRN
  Administered 2014-05-28 – 2014-05-29 (×6): 4 mg via INTRAVENOUS
  Filled 2014-05-28 (×5): qty 2

## 2014-05-28 MED ORDER — BUPIVACAINE LIPOSOME 1.3 % IJ SUSP
20.0000 mL | Freq: Once | INTRAMUSCULAR | Status: DC
  Filled 2014-05-28: qty 20

## 2014-05-28 MED ORDER — DEXTROSE 5 % IV SOLN
2.0000 g | INTRAVENOUS | Status: AC
  Administered 2014-05-28: 2 g via INTRAVENOUS

## 2014-05-28 MED ORDER — UNJURY VANILLA POWDER
2.0000 [oz_av] | Freq: Four times a day (QID) | ORAL | Status: DC
  Administered 2014-05-30: 8 [oz_av] via ORAL

## 2014-05-28 MED ORDER — ONDANSETRON HCL 4 MG/2ML IJ SOLN
INTRAMUSCULAR | Status: AC
  Administered 2014-05-28: 4 mg via INTRAVENOUS
  Filled 2014-05-28: qty 2

## 2014-05-28 MED ORDER — PROPOFOL 10 MG/ML IV BOLUS
INTRAVENOUS | Status: AC
  Filled 2014-05-28: qty 20

## 2014-05-28 MED ORDER — EPHEDRINE SULFATE 50 MG/ML IJ SOLN
INTRAMUSCULAR | Status: DC | PRN
  Administered 2014-05-28 (×6): 10 mg via INTRAVENOUS

## 2014-05-28 MED ORDER — FENTANYL CITRATE 0.05 MG/ML IJ SOLN
INTRAMUSCULAR | Status: DC | PRN
  Administered 2014-05-28 (×5): 50 ug via INTRAVENOUS

## 2014-05-28 MED ORDER — SODIUM CHLORIDE 0.9 % IJ SOLN
INTRAMUSCULAR | Status: DC | PRN
  Administered 2014-05-28: 10 mL

## 2014-05-28 MED ORDER — ACETAMINOPHEN 10 MG/ML IV SOLN
1000.0000 mg | Freq: Once | INTRAVENOUS | Status: AC
  Administered 2014-05-28: 1000 mg via INTRAVENOUS
  Filled 2014-05-28: qty 100

## 2014-05-28 MED ORDER — HYDROMORPHONE HCL 1 MG/ML IJ SOLN: 0.2500 mg | INTRAMUSCULAR | Status: DC | PRN

## 2014-05-28 MED ORDER — ROCURONIUM BROMIDE 100 MG/10ML IV SOLN
INTRAVENOUS | Status: DC | PRN
  Administered 2014-05-28: 40 mg via INTRAVENOUS

## 2014-05-28 MED ORDER — MORPHINE SULFATE 2 MG/ML IJ SOLN
2.0000 mg | INTRAMUSCULAR | Status: DC | PRN
  Administered 2014-05-28 – 2014-05-29 (×5): 2 mg via INTRAVENOUS
  Filled 2014-05-28 (×4): qty 1

## 2014-05-28 MED ORDER — UNJURY CHICKEN SOUP POWDER: 2.0000 [oz_av] | Freq: Four times a day (QID) | ORAL | Status: DC

## 2014-05-28 MED ORDER — EPHEDRINE SULFATE 50 MG/ML IJ SOLN
INTRAMUSCULAR | Status: AC
  Filled 2014-05-28: qty 1

## 2014-05-28 MED ORDER — MORPHINE SULFATE 2 MG/ML IJ SOLN
INTRAMUSCULAR | Status: AC
  Administered 2014-05-28: 2 mg via INTRAVENOUS
  Filled 2014-05-28: qty 1

## 2014-05-28 MED ORDER — EPHEDRINE SULFATE 50 MG/ML IJ SOLN
INTRAMUSCULAR | Status: AC
Start: 2014-05-28 — End: 2014-05-28
  Filled 2014-05-28: qty 1

## 2014-05-28 MED ORDER — GLYCOPYRROLATE 0.2 MG/ML IJ SOLN
INTRAMUSCULAR | Status: AC
  Filled 2014-05-28: qty 3

## 2014-05-28 MED ORDER — GLYCOPYRROLATE 0.2 MG/ML IJ SOLN
INTRAMUSCULAR | Status: DC | PRN
  Administered 2014-05-28: 0.6 mg via INTRAVENOUS
  Administered 2014-05-28: 0.2 mg via INTRAVENOUS

## 2014-05-28 MED ORDER — LACTATED RINGERS IR SOLN
Status: DC | PRN
  Administered 2014-05-28: 1000 mL

## 2014-05-28 MED ORDER — UNJURY CHOCOLATE CLASSIC POWDER
2.0000 [oz_av] | Freq: Four times a day (QID) | ORAL | Status: DC
  Administered 2014-05-30: 2 [oz_av] via ORAL

## 2014-05-28 MED ORDER — ONDANSETRON HCL 4 MG/2ML IJ SOLN
INTRAMUSCULAR | Status: DC | PRN
  Administered 2014-05-28: 4 mg via INTRAVENOUS

## 2014-05-28 MED ORDER — ACETAMINOPHEN 160 MG/5ML PO SOLN: 650.0000 mg | ORAL | Status: DC | PRN

## 2014-05-28 MED ORDER — MIDAZOLAM HCL 5 MG/5ML IJ SOLN
INTRAMUSCULAR | Status: DC | PRN
  Administered 2014-05-28: 2 mg via INTRAVENOUS

## 2014-05-28 MED ORDER — NEOSTIGMINE METHYLSULFATE 10 MG/10ML IV SOLN
INTRAVENOUS | Status: AC
  Filled 2014-05-28: qty 1

## 2014-05-28 MED ORDER — HEPARIN SODIUM (PORCINE) 5000 UNIT/ML IJ SOLN
5000.0000 [IU] | Freq: Three times a day (TID) | INTRAMUSCULAR | Status: DC
  Administered 2014-05-28 – 2014-05-30 (×6): 5000 [IU] via SUBCUTANEOUS
  Filled 2014-05-28 (×9): qty 1

## 2014-05-28 MED ORDER — PROPOFOL 10 MG/ML IV BOLUS
INTRAVENOUS | Status: DC | PRN
  Administered 2014-05-28: 150 mg via INTRAVENOUS

## 2014-05-28 MED ORDER — NEOSTIGMINE METHYLSULFATE 10 MG/10ML IV SOLN
INTRAVENOUS | Status: DC | PRN
Start: 2014-05-28 — End: 2014-05-28
  Administered 2014-05-28: 5 mg via INTRAVENOUS

## 2014-05-28 MED ORDER — DEXAMETHASONE SODIUM PHOSPHATE 10 MG/ML IJ SOLN
INTRAMUSCULAR | Status: DC | PRN
  Administered 2014-05-28: 10 mg via INTRAVENOUS

## 2014-05-28 SURGICAL SUPPLY — 59 items
APPLICATOR COTTON TIP 6IN STRL (MISCELLANEOUS) IMPLANT
APPLIER CLIP 5 13 M/L LIGAMAX5 (MISCELLANEOUS)
APPLIER CLIP ROT 10 11.4 M/L (STAPLE)
APPLIER CLIP ROT 13.4 12 LRG (CLIP)
BLADE SURG 15 STRL LF DISP TIS (BLADE) ×2 IMPLANT
BLADE SURG 15 STRL SS (BLADE) ×1
CABLE HIGH FREQUENCY MONO STRZ (ELECTRODE) IMPLANT
CLIP APPLIE 5 13 M/L LIGAMAX5 (MISCELLANEOUS) IMPLANT
CLIP APPLIE ROT 10 11.4 M/L (STAPLE) IMPLANT
CLIP APPLIE ROT 13.4 12 LRG (CLIP) IMPLANT
DEVICE SUT QUICK LOAD TK 5 (STAPLE) ×3 IMPLANT
DEVICE SUT TI-KNOT TK 5X26 (MISCELLANEOUS) ×3 IMPLANT
DEVICE SUTURE ENDOST 10MM (ENDOMECHANICALS) IMPLANT
DEVICE TROCAR PUNCTURE CLOSURE (ENDOMECHANICALS) ×3 IMPLANT
DISSECTOR BLUNT TIP ENDO 5MM (MISCELLANEOUS) ×3 IMPLANT
DRAPE CAMERA CLOSED 9X96 (DRAPES) ×3 IMPLANT
ELECT REM PT RETURN 9FT ADLT (ELECTROSURGICAL) ×3
ELECTRODE REM PT RTRN 9FT ADLT (ELECTROSURGICAL) ×2 IMPLANT
GAUZE SPONGE 4X4 12PLY STRL (GAUZE/BANDAGES/DRESSINGS) IMPLANT
GLOVE BIOGEL M 8.0 STRL (GLOVE) ×3 IMPLANT
GOWN STRL REUS W/TWL XL LVL3 (GOWN DISPOSABLE) ×12 IMPLANT
HANDLE STAPLE EGIA 4 XL (STAPLE) ×3 IMPLANT
HOVERMATT SINGLE USE (MISCELLANEOUS) ×3 IMPLANT
KIT BASIN OR (CUSTOM PROCEDURE TRAY) ×3 IMPLANT
LIQUID BAND (GAUZE/BANDAGES/DRESSINGS) IMPLANT
NEEDLE SPNL 22GX3.5 QUINCKE BK (NEEDLE) ×3 IMPLANT
PACK UNIVERSAL I (CUSTOM PROCEDURE TRAY) ×3 IMPLANT
PEN SKIN MARKING BROAD (MISCELLANEOUS) ×3 IMPLANT
RELOAD ENDO STITCH (ENDOMECHANICALS) IMPLANT
RELOAD TRI 45 ART MED THCK BLK (STAPLE) ×6 IMPLANT
RELOAD TRI 45 ART MED THCK PUR (STAPLE) IMPLANT
RELOAD TRI 60 ART MED THCK BLK (STAPLE) ×12 IMPLANT
RELOAD TRI 60 ART MED THCK PUR (STAPLE) ×6 IMPLANT
SCISSORS LAP 5X45 EPIX DISP (ENDOMECHANICALS) IMPLANT
SCRUB PCMX 4 OZ (MISCELLANEOUS) ×6 IMPLANT
SEALANT SURGICAL APPL DUAL CAN (MISCELLANEOUS) IMPLANT
SET IRRIG TUBING LAPAROSCOPIC (IRRIGATION / IRRIGATOR) ×3 IMPLANT
SHEARS CURVED HARMONIC AC 45CM (MISCELLANEOUS) ×3 IMPLANT
SLEEVE ADV FIXATION 5X100MM (TROCAR) ×6 IMPLANT
SLEEVE GASTRECTOMY 36FR VISIGI (MISCELLANEOUS) ×3 IMPLANT
SOLUTION ANTI FOG 6CC (MISCELLANEOUS) ×3 IMPLANT
SPONGE LAP 18X18 X RAY DECT (DISPOSABLE) ×3 IMPLANT
STAPLER VISISTAT 35W (STAPLE) ×3 IMPLANT
SUT DEVICE BRAIDED 2.0X39 (SUTURE) ×3 IMPLANT
SUT VIC AB 4-0 SH 18 (SUTURE) ×3 IMPLANT
SUT VICRYL 0 TIES 12 18 (SUTURE) ×3 IMPLANT
SYR 20CC LL (SYRINGE) ×3 IMPLANT
SYR 50ML LL SCALE MARK (SYRINGE) ×3 IMPLANT
TOWEL OR 17X26 10 PK STRL BLUE (TOWEL DISPOSABLE) ×6 IMPLANT
TOWEL OR NON WOVEN STRL DISP B (DISPOSABLE) ×3 IMPLANT
TRAY FOLEY CATH 14FRSI W/METER (CATHETERS) IMPLANT
TROCAR ADV FIXATION 12X100MM (TROCAR) ×3 IMPLANT
TROCAR ADV FIXATION 5X100MM (TROCAR) ×3 IMPLANT
TROCAR BLADELESS 15MM (ENDOMECHANICALS) ×3 IMPLANT
TROCAR BLADELESS OPT 5 100 (ENDOMECHANICALS) ×3 IMPLANT
TUBE CALIBRATION LAPBAND (TUBING) IMPLANT
TUBING CONNECTING 10 (TUBING) ×3 IMPLANT
TUBING ENDO SMARTCAP (MISCELLANEOUS) ×3 IMPLANT
TUBING FILTER THERMOFLATOR (ELECTROSURGICAL) ×3 IMPLANT

## 2014-05-28 NOTE — Op Note (Signed)
Surgeon: Wenda LowMatt Quinlyn Tep, MD, FACS  Asst:  Jaclynn GuarneriBen Hoxworth, MD,FACS  Anes:  General endotracheal  Procedure: Laparoscopic sleeve gastrectomy, 1 suture posterior repair of hiatus hernia  and upper endoscopy  Diagnosis: Morbid obesity  Complications: None noted  EBL:   10 cc  Description of Procedure:  The patient was take to OR 1 and given general anesthesia.  The abdomen was prepped with PCMX and draped sterilely.  A timeout was performed.  Access to the abdomen was achieved with a 5 mm Optiview through the left upper quadrant.  A total of 6 trocars were used including a 15 mm right lower adjacent the umbilicus.  Following insufflation, the state of the abdomen was found to be relatively free of adhesions.  The ViSiGi 36Fr tube was inserted to deflate the stomach and was pulled back into the esophagus.  A small hiatal hernia was seen on UGI and a dimple was seen.  The gastrohepatic window was opened and the right crus was opened and dissected posteriorally.  The right and left crua were dissected and then closed with a 0 Surgidek placed with the Endo 360.  This was secured with a ty knot.    The pylorus was identified and we measured 5 cm back and marked the antrum.  At that point we began dissection to take down the greater curvature of the stomach using the Harmonic scalpel.  This dissection was taken all the way up to the left crus.  Posterior attachments of the stomach were also taken down.    The ViSiGi tube was then passed into the antrum and suction applied so that it was snug along the lessor curvature.  The "crow's foot" or incisura was identified.  The sleeve gastrectomy was begun using the [employer] platform stapler beginning with a 4.5 black load with TRS followed by a 6 black and the TRS.  The sleeve was completed with purple 6 cm loads with TRS.  When the sleeve was complete the tube was taken off suction and insufflated briefly.  The tube was withdrawn.  Upper endoscopy was then  performed by Dr. Johna SheriffHoxworth which showed a uniform sleeve, no bleeding and no bubbles.     The specimen was extracted through the 15 trocar site.  Wounds were infiltrated with 30 cc of dilute Exparel and closed with 4-0 vicryl.  The 15 mm port was closed with 0 vicryl and the Endoclose.    Matt B. Daphine DeutscherMartin, MD, [facility], [place] [phone]

## 2014-05-28 NOTE — Anesthesia Procedure Notes (Signed)
Procedure Name: Intubation Date/Time: 05/28/2014 7:33 AM Performed by: Orest DikesPETERS, Joani Cosma J Pre-anesthesia Checklist: Patient identified, Emergency Drugs available, Suction available, Patient being monitored and Timeout performed Patient Re-evaluated:Patient Re-evaluated prior to inductionPreoxygenation: Pre-oxygenation with 100% oxygen Intubation Type: IV induction Ventilation: Mask ventilation without difficulty and Oral airway inserted - appropriate to patient size Laryngoscope Size: Mac and 4 Grade View: Grade II Tube type: Oral Tube size: 7.5 mm Number of attempts: 1 Placement Confirmation: ETT inserted through vocal cords under direct vision,  breath sounds checked- equal and bilateral and positive ETCO2 Secured at: 21 cm Tube secured with: Tape Dental Injury: Teeth and Oropharynx as per pre-operative assessment  Comments: Small mouth opening and limited neck motion. Easy mask ventilation with use of oral airway. DLX1 with Mac 4 with Grade 2 view.

## 2014-05-28 NOTE — Progress Notes (Signed)
Patient alert and oriented, op day.  Provided support and encouragement.  Encouraged pulmonary toilet and ambulation.  All questions answered.  Will continue to monitor. 

## 2014-05-28 NOTE — H&P (View-Only) (Signed)
Chief Complaint:  Morbid obesity BMI 41  History of Present Illness:  Janet Turner is an 49 y.o. female who Has been evaluated in our office and worked up for sleeve gastrectomy.  Informed consent has been obtained in the office regarding this.  She has had chronic obstructive sleep apnea but does not use her CPAP machine.  She had a prior laparoscopic cholecystectomy.  As a part of her workup she had an upper GI series showed a tiny sliding hiatal hernia.  She is ready for sleeve gastrectomy.  Plan to proceed on Tuesday, May 28 2014  Past Medical History  Diagnosis Date  . Hypertension   . Hypoglycemia   . Hyperlipemia   . Leg pain   . Chicken pox   . Depression   . Heart murmur   . Hypopituitarism   . PTSD (post-traumatic stress disorder)   . Family history of adverse reaction to anesthesia     mother- nausea and vomiting   . Complication of anesthesia     oxygen level  and blood pressure dropped wtih anterior cervical fusion surgery   . Sleep apnea     no cpap   . Hypothyroidism   . GERD (gastroesophageal reflux disease)     Past Surgical History  Procedure Laterality Date  . Eye surgery  68,70,82    x's 3   . Cholecystectomy  97  . Cesarean section  91  . Breast reduction surgery  96  . Anterior cervical discectomy  2013    Ant Cervical Fusion C4-5 with diskectomy  . Ganglion cyst removed      Current Outpatient Prescriptions  Medication Sig Dispense Refill  . atorvastatin (LIPITOR) 20 MG tablet TAKE 1 TABLET BY MOUTH ONCE DAILY (Patient not taking: Reported on 05/17/2014) 30 tablet 1  . atorvastatin (LIPITOR) 20 MG tablet Take 20 mg by mouth daily.    . Cholecalciferol (VITAMIN D-3) 1000 UNITS CAPS Take 1 capsule by mouth daily.     . diphenoxylate-atropine (LOMOTIL) 2.5-0.025 MG per tablet Take 1 tablet by mouth 4 (four) times daily as needed for diarrhea or loose stools. (Patient not taking: Reported on 04/13/2014) 30 tablet 0  . lamoTRIgine (LAMICTAL) 150 MG  tablet Take 150 mg by mouth every morning.    . levothyroxine (SYNTHROID, LEVOTHROID) 75 MCG tablet Take 1 tablet (75 mcg total) by mouth daily before breakfast. 30 tablet 5  . lisinopril (PRINIVIL,ZESTRIL) 20 MG tablet Take 20 mg by mouth every morning.     . Multiple Vitamin (MULTIVITAMIN) tablet Take 1 tablet by mouth daily.    . polyethylene glycol (MIRALAX / GLYCOLAX) packet Take 17 g by mouth daily. As needed    . pramipexole (MIRAPEX) 0.25 MG tablet Take 1 tablet (0.25 mg total) by mouth at bedtime. 30 tablet 2  . QUEtiapine (SEROQUEL XR) 400 MG 24 hr tablet Take 400 mg by mouth at bedtime.    . ranitidine (ZANTAC) 150 MG tablet Take 1 tablet (150 mg total) by mouth 2 (two) times daily. (Patient taking differently: Take 150 mg by mouth 2 (two) times daily. As needed) 60 tablet 0  . sertraline (ZOLOFT) 100 MG tablet Take 200 mg by mouth every morning.      No current facility-administered medications for this visit.   [place] Family History  Problem Relation Age of Onset  . Osteoarthritis Mother   . Cancer Mother 69    breast--stage III  invasive ductal ca  . Hypertension Father   .   Melanoma Father   . Depression Father     PTSD  . Cancer Father     melanoma  . Alcoholism    . Ovarian cancer Maternal Grandmother   . Breast cancer Maternal Grandmother   . Lung cancer Maternal Grandmother   . Prostate cancer Paternal Grandfather   . Stroke      Paternal Family--9/12 children after age 60  . Anuerysm      Paternal family  . Hypertension      Paternal family   Social History:   reports that she has never smoked. She has never used smokeless tobacco. She reports that she does not drink alcohol or use illicit drugs.   REVIEW OF SYSTEMS : Negative except for HPI  Physical Exam:   There were no vitals taken for this visit. There is no weight on file to calculate BMI.  Gen:  WDWN WF NAD  Neurological: Alert and oriented to person, place, and time. Motor and sensory  function is grossly intact  Head: Normocephalic and atraumatic.  Eyes: Conjunctivae are normal. Pupils are equal, round, and reactive to light. No scleral icterus.  Neck: Normal range of motion. Neck supple. No tracheal deviation or thyromegaly present.  Cardiovascular:  SR without murmurs or gallops.  No carotid bruits Breast:  Not examined Respiratory: Effort normal.  No respiratory distress. No chest wall tenderness. Breath sounds normal.  No wheezes, rales or rhonchi.  Abdomen:  obese GU:  Not examined Musculoskeletal: Normal range of motion. Extremities are nontender. No cyanosis, edema or clubbing noted Lymphadenopathy: No cervical, preauricular, postauricular or axillary adenopathy is present Skin: Skin is warm and dry. No rash noted. No diaphoresis. No erythema. No pallor. Pscyh: Normal mood and affect. Behavior is normal. Judgment and thought content normal.   LABORATORY RESULTS: No results found for this or any previous visit (from the past 48 hour(s)).   RADIOLOGY RESULTS: No results found.  Problem List: Patient Active Problem List   Diagnosis Date Noted  . Undiagnosed cardiac murmurs 03/21/2014  . Gastroesophageal reflux disease without esophagitis 03/19/2014  . Viral gastroenteritis 03/19/2014  . Bipolar 1 disorder, mixed, moderate 01/18/2014  . Hyponatremia 11/27/2013  . History of bipolar disorder 11/27/2013  . Encephalopathy 11/27/2013  . HTN (hypertension) 07/23/2013  . Other and unspecified hyperlipidemia 07/23/2013  . Unspecified hypothyroidism 07/23/2013  . PTSD (post-traumatic stress disorder) 07/23/2013  . 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 Obesity (BMI 30-39.9) 07/23/2013  . Ganglion cyst 07/23/2013    Assessment & Plan: Morbid obesity Sleeve gastrectomy and possible hiatal hernia repair.      Matt B. Silas Sedam, MD, FACS  [facility] [phone] beeper [phone]  05/24/2014 5:51 PM      

## 2014-05-28 NOTE — Interval H&P Note (Signed)
History and Physical Interval Note:  05/28/2014 7:19 AM  [name]  has presented today for surgery, with the diagnosis of Morbid Obesity  The various methods of treatment have been discussed with the patient and family. After consideration of risks, benefits and other options for treatment, the patient has consented to  Procedure(s): LAPAROSCOPIC GASTRIC SLEEVE RESECTION (N/A) as a surgical intervention .  The patient's history has been reviewed, patient examined, no change in status, stable for surgery.  I have reviewed the patient's chart and labs.  Questions were answered to the patient's satisfaction.     Aristidis Talerico B

## 2014-05-28 NOTE — Transfer of Care (Signed)
Immediate Anesthesia Transfer of Care Note  Patient: Janet Turner  Procedure(s) Performed: Procedure(s) (LRB): LAPAROSCOPIC GASTRIC SLEEVE RESECTION (N/A) UPPER GI ENDOSCOPY (N/A)  Patient Location: PACU  Anesthesia Type: General  Level of Consciousness: sedated, patient cooperative and responds to stimulation  Airway & Oxygen Therapy: Patient Spontanous Breathing and Patient connected to face mask oxgen  Post-op Assessment: Report given to PACU RN and Post -op Vital signs reviewed and stable  Post vital signs: Reviewed and stable  Complications: No apparent anesthesia complications

## 2014-05-28 NOTE — Anesthesia Postprocedure Evaluation (Signed)
  Anesthesia Post-op Note  Patient: Janet Turner  Procedure(s) Performed: Procedure(s) (LRB): LAPAROSCOPIC GASTRIC SLEEVE RESECTION (N/A) UPPER GI ENDOSCOPY (N/A)  Patient Location: PACU  Anesthesia Type: General  Level of Consciousness: awake and alert   Airway and Oxygen Therapy: Patient Spontanous Breathing  Post-op Pain: mild  Post-op Assessment: Post-op Vital signs reviewed, Patient's Cardiovascular Status Stable, Respiratory Function Stable, Patent Airway and No signs of Nausea or vomiting  Last Vitals:  Filed Vitals:   05/28/14 1000  BP: 125/69  Pulse: 57  Temp:   Resp: 17    Post-op Vital Signs: stable   Complications: No apparent anesthesia complications

## 2014-05-29 ENCOUNTER — Inpatient Hospital Stay (HOSPITAL_COMMUNITY): Payer: 59

## 2014-05-29 ENCOUNTER — Encounter (HOSPITAL_COMMUNITY): Payer: Self-pay | Admitting: Surgery

## 2014-05-29 LAB — CBC WITH DIFFERENTIAL/PLATELET
Basophils Absolute: 0 10*3/uL (ref 0.0–0.1)
Basophils Relative: 0 % (ref 0–1)
EOS PCT: 0 % (ref 0–5)
Eosinophils Absolute: 0 10*3/uL (ref 0.0–0.7)
HCT: 34.4 % — ABNORMAL LOW (ref 36.0–46.0)
HEMOGLOBIN: 11.1 g/dL — AB (ref 12.0–15.0)
Lymphocytes Relative: 19 % (ref 12–46)
Lymphs Abs: 1.8 10*3/uL (ref 0.7–4.0)
MCH: 28.9 pg (ref 26.0–34.0)
MCHC: 32.3 g/dL (ref 30.0–36.0)
MCV: 89.6 fL (ref 78.0–100.0)
MONOS PCT: 6 % (ref 3–12)
Monocytes Absolute: 0.5 10*3/uL (ref 0.1–1.0)
Neutro Abs: 7.2 10*3/uL (ref 1.7–7.7)
Neutrophils Relative %: 75 % (ref 43–77)
Platelets: 229 10*3/uL (ref 150–400)
RBC: 3.84 MIL/uL — ABNORMAL LOW (ref 3.87–5.11)
RDW: 13.7 % (ref 11.5–15.5)
WBC: 9.6 10*3/uL (ref 4.0–10.5)

## 2014-05-29 MED ORDER — IOHEXOL 300 MG/ML  SOLN
50.0000 mL | Freq: Once | INTRAMUSCULAR | Status: AC | PRN
  Administered 2014-05-29: 20 mL via INTRAVENOUS

## 2014-05-29 NOTE — Progress Notes (Signed)
Patient alert and oriented, Post op day 1.  Provided support and encouragement.  Encouraged pulmonary toilet, ambulation and small sips of liquids.  All questions answered.  Will continue to monitor. 

## 2014-05-29 NOTE — Progress Notes (Signed)
Patient ID: Janet Turner, female   DOB: 01/02/1966, 49 y.o.   MRN: [id number] [facility] Progress Note:   1 Day Post-Op  Subjective: Mental status is clear.  No complaints except some nausea Objective: Vital signs in last 24 hours: Temp:  [97 F (36.1 C)-99.1 F (37.3 C)] 98.6 F (37 C) (01/20 0527) Pulse Rate:  [53-88] 74 (01/20 0527) Resp:  [14-21] 18 (01/20 0527) BP: (106-127)/(35-70) 116/70 mmHg (01/20 0527) SpO2:  [92 %-100 %] 95 % (01/20 0527) Weight:  [194 lb 5.9 oz (88.166 kg)] 194 lb 5.9 oz (88.166 kg) (01/20 0527)  Intake/Output from previous day: 01/19 0701 - 01/20 0700 In: 2900 [I.V.:2900] Out: 1250 [Urine:1250] Intake/Output this shift:    Physical Exam: Work of breathing is normal.  Abdomen is minimally sore  Lab Results:  Results for orders placed or performed during the hospital encounter of 05/28/14 (from the past 48 hour(s))  Pregnancy, urine STAT morning of surgery     Status: None   Collection Time: 05/28/14  5:25 AM  Result Value Ref Range   Preg Test, Ur NEGATIVE NEGATIVE    Comment:        THE SENSITIVITY OF THIS METHODOLOGY IS >20 mIU/mL.   CBC     Status: Abnormal   Collection Time: 05/28/14 11:35 AM  Result Value Ref Range   WBC 11.1 (H) 4.0 - 10.5 K/uL   RBC 3.96 3.87 - 5.11 MIL/uL   Hemoglobin 11.6 (L) 12.0 - 15.0 g/dL   HCT 36.0 36.0 - 46.0 %   MCV 90.9 78.0 - 100.0 fL   MCH 29.3 26.0 - 34.0 pg   MCHC 32.2 30.0 - 36.0 g/dL   RDW 13.7 11.5 - 15.5 %   Platelets 210 150 - 400 K/uL  Creatinine, serum     Status: Abnormal   Collection Time: 05/28/14 11:35 AM  Result Value Ref Range   Creatinine, Ser 1.17 (H) 0.50 - 1.10 mg/dL   GFR calc non Af Amer 54 (L) >90 mL/min   GFR calc Af Amer 62 (L) >90 mL/min    Comment: (NOTE) The eGFR has been calculated using the CKD EPI equation. This calculation has not been validated in all clinical situations. eGFR's persistently <90 mL/min signify possible Chronic Kidney Disease.   CBC  WITH DIFFERENTIAL     Status: Abnormal   Collection Time: 05/29/14  4:14 AM  Result Value Ref Range   WBC 9.6 4.0 - 10.5 K/uL   RBC 3.84 (L) 3.87 - 5.11 MIL/uL   Hemoglobin 11.1 (L) 12.0 - 15.0 g/dL   HCT 34.4 (L) 36.0 - 46.0 %   MCV 89.6 78.0 - 100.0 fL   MCH 28.9 26.0 - 34.0 pg   MCHC 32.3 30.0 - 36.0 g/dL   RDW 13.7 11.5 - 15.5 %   Platelets 229 150 - 400 K/uL   Neutrophils Relative % 75 43 - 77 %   Neutro Abs 7.2 1.7 - 7.7 K/uL   Lymphocytes Relative 19 12 - 46 %   Lymphs Abs 1.8 0.7 - 4.0 K/uL   Monocytes Relative 6 3 - 12 %   Monocytes Absolute 0.5 0.1 - 1.0 K/uL   Eosinophils Relative 0 0 - 5 %   Eosinophils Absolute 0.0 0.0 - 0.7 K/uL   Basophils Relative 0 0 - 1 %   Basophils Absolute 0.0 0.0 - 0.1 K/uL    Radiology/Results: No results found.  Anti-infectives: Anti-infectives    Start  Dose/Rate Route Frequency Ordered Stop   05/28/14 0515  cefOXitin (MEFOXIN) 2 g in dextrose 5 % 50 mL IVPB     2 g100 mL/hr over 30 Minutes Intravenous On call to O.R. 05/28/14 0515 05/28/14 0745      Assessment/Plan: Problem List: Patient Active Problem List   Diagnosis Date Noted  . Morbid obesity 05/28/2014  . Undiagnosed cardiac murmurs 03/21/2014  . Gastroesophageal reflux disease without esophagitis 03/19/2014  . Viral gastroenteritis 03/19/2014  . Bipolar 1 disorder, mixed, moderate 01/18/2014  . Hyponatremia 11/27/2013  . History of bipolar disorder 11/27/2013  . Encephalopathy 11/27/2013  . HTN (hypertension) 07/23/2013  . Other and unspecified hyperlipidemia 07/23/2013  . Unspecified hypothyroidism 07/23/2013  . PTSD (post-traumatic stress disorder) 07/23/2013  . 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 Obesity (BMI 30-39.9) 07/23/2013  . Ganglion cyst 07/23/2013    Awaiting UGI.  Diet advancement accordingly.  1 Day Post-Op    LOS: 1 day   Matt B. Hassell Done, MD, [facility] [phone] beeper [phone]  05/29/2014 8:15 AM

## 2014-05-29 NOTE — Plan of Care (Signed)
Problem: Food- and Nutrition-Related Knowledge Deficit (NB-1.1) Goal: Nutrition education Formal process to instruct or train a patient/client in a skill or to impart knowledge to help patients/clients voluntarily manage or modify food choices and eating behavior to maintain or improve health. Outcome: Completed/Met Date Met:  05/29/14 Nutrition Education Note  Received consult for diet education per DROP protocol.   S/p 1/19 Procedure(s) (LRB): LAPAROSCOPIC GASTRIC SLEEVE RESECTION (N/A) UPPER GI ENDOSCOPY  Discussed 2 week post op diet with pt. Emphasized that liquids must be non carbonated, non caffeinated, and sugar free. Fluid goals discussed. Pt to follow up with outpatient bariatric RD for further diet progression after 2 weeks. Multivitamins and minerals also reviewed. Teach back method used, pt expressed understanding, expect good compliance.   Diet: First 2 Weeks  You will see the nutritionist about two (2) weeks after your surgery. The nutritionist will increase the types of foods you can eat if you are handling liquids well:  If you have severe vomiting or nausea and cannot handle clear liquids lasting longer than 1 day, call your surgeon  Protein Shake  Drink at least 2 ounces of shake 5-6 times per day  Each serving of protein shakes (usually 8 - 12 ounces) should have a minimum of:  15 grams of protein  And no more than 5 grams of carbohydrate  Goal for protein each day:  Men = 80 grams per day  Women = 60 grams per day  Protein powder may be added to fluids such as non-fat milk or Lactaid milk or Soy milk (limit to 35 grams added protein powder per serving)   Hydration  Slowly increase the amount of water and other clear liquids as tolerated (See Acceptable Fluids)  Slowly increase the amount of protein shake as tolerated  Sip fluids slowly and throughout the day  May use sugar substitutes in small amounts (no more than 6 - 8 packets per day; i.e. Splenda)   Fluid  Goal  The first goal is to drink at least 8 ounces of protein shake/drink per day (or as directed by the nutritionist); some examples of protein shakes are [employer], [employer], EAS Edge HP, and Unjury. See handout from pre-op Bariatric Education Class:  Slowly increase the amount of protein shake you drink as tolerated  You may find it easier to slowly sip shakes throughout the day  It is important to get your proteins in first  Your fluid goal is to drink 64 - 100 ounces of fluid daily  It may take a few weeks to build up to this  32 oz (or more) should be clear liquids  And  32 oz (or more) should be full liquids (see below for examples)  Liquids should not contain sugar, caffeine, or carbonation   Clear Liquids:  Water or Sugar-free flavored water (i.e. Fruit H2O, Propel)  Decaffeinated coffee or tea (sugar-free)  Crystal Lite, Wyler's Lite, Minute Maid Lite  Sugar-free Jell-O  Bouillon or broth  Sugar-free Popsicle: *Less than 20 calories each; Limit 1 per day   Full Liquids:  Protein Shakes/Drinks + 2 choices per day of other full liquids  Full liquids must be:  No More Than 12 grams of Carbs per serving  No More Than 3 grams of Fat per serving  Strained low-fat cream soup  Non-Fat milk  Fat-free Lactaid Milk  Sugar-free yogurt (Dannon Lite & Fit, Greek yogurt)     Clayton Bibles, MS, RD, LDN Pager: [phone] After Hours Pager: [phone]

## 2014-05-30 DIAGNOSIS — Z9884 Bariatric surgery status: Secondary | ICD-10-CM

## 2014-05-30 LAB — CBC WITH DIFFERENTIAL/PLATELET
BASOS ABS: 0 10*3/uL (ref 0.0–0.1)
Basophils Relative: 0 % (ref 0–1)
EOS ABS: 0.2 10*3/uL (ref 0.0–0.7)
Eosinophils Relative: 2 % (ref 0–5)
HCT: 36 % (ref 36.0–46.0)
Hemoglobin: 11.6 g/dL — ABNORMAL LOW (ref 12.0–15.0)
LYMPHS ABS: 1.6 10*3/uL (ref 0.7–4.0)
Lymphocytes Relative: 18 % (ref 12–46)
MCH: 28.9 pg (ref 26.0–34.0)
MCHC: 32.2 g/dL (ref 30.0–36.0)
MCV: 89.8 fL (ref 78.0–100.0)
Monocytes Absolute: 0.6 10*3/uL (ref 0.1–1.0)
Monocytes Relative: 7 % (ref 3–12)
Neutro Abs: 6.4 10*3/uL (ref 1.7–7.7)
Neutrophils Relative %: 73 % (ref 43–77)
PLATELETS: 288 10*3/uL (ref 150–400)
RBC: 4.01 MIL/uL (ref 3.87–5.11)
RDW: 13.7 % (ref 11.5–15.5)
WBC: 8.8 10*3/uL (ref 4.0–10.5)

## 2014-05-30 NOTE — Discharge Instructions (Signed)

## 2014-05-30 NOTE — Progress Notes (Signed)
Pt given discharge instructions, verbalized understanding. Pt drinking bariatric shake, tolerating well. Pt's IV removed and 2x2 applied with transpore tape. Pt ready for DC, waiting for ride.

## 2014-05-30 NOTE — Plan of Care (Signed)
Problem: Consults Goal: Gastric Sleeve Patient Education See Patient Education Module for education specifics.  Outcome: Completed/Met Date Met:  05/30/14 Seen by Vilinda Flake, RN for discharge instructions. Pt verbalized understanding and all questions addressed.

## 2014-05-30 NOTE — Progress Notes (Signed)
Patient alert and oriented, pain is controlled. Patient is tolerating fluids,  advanced to protein shake today, patient tolerated well.  Reviewed Gastric sleeve discharge instructions with patient and patient is able to articulate understanding.  Provided information on BELT program, Support Group and [facility] outpatient pharmacy. All questions answered, will continue to monitor.  

## 2014-05-30 NOTE — Discharge Summary (Signed)
Physician Discharge Summary  Patient ID: Janet Turner MRN: [id number] DOB/AGE: 50/12/1965 50 y.o.  Admit date: 05/28/2014 Discharge date: 05/30/2014  Admission Diagnoses:  Morbid obesity  Discharge Diagnoses:  same  Active Problems:   Morbid obesity   Surgery:  Laparoscopic sleeve gastrectomy with hiatus hernia repair  Discharged Condition: improved  Hospital Course:   Had surgery.  PD 1 had UGI which was OK.  Begun on clears and advanced.  Ready for discharge.   Consults: none  Significant Diagnostic Studies: UGI    Discharge Exam: Blood pressure 165/86, pulse 72, temperature 98.4 F (36.9 C), temperature source Oral, resp. rate 18, height 4\' 9"  (1.448 m), weight 194 lb 5.9 oz (88.166 kg), SpO2 97 %. Incisions OK.    Disposition: 01-Home or Self Care     Medication List    ASK your doctor about these medications        atorvastatin 20 MG tablet  Commonly known as:  LIPITOR  Take 20 mg by mouth daily.     atorvastatin 20 MG tablet  Commonly known as:  LIPITOR  TAKE 1 TABLET BY MOUTH ONCE DAILY     diphenoxylate-atropine 2.5-0.025 MG per tablet  Commonly known as:  LOMOTIL  Take 1 tablet by mouth 4 (four) times daily as needed for diarrhea or loose stools.     lamoTRIgine 150 MG tablet  Commonly known as:  LAMICTAL  Take 150 mg by mouth every morning.     levothyroxine 75 MCG tablet  Commonly known as:  SYNTHROID, LEVOTHROID  Take 1 tablet (75 mcg total) by mouth daily before breakfast.     lisinopril 20 MG tablet  Commonly known as:  PRINIVIL,ZESTRIL  Take 20 mg by mouth every morning.     multivitamin tablet  Take 1 tablet by mouth daily.     polyethylene glycol packet  Commonly known as:  MIRALAX / GLYCOLAX  Take 17 g by mouth daily. As needed     pramipexole 0.25 MG tablet  Commonly known as:  MIRAPEX  Take 1 tablet (0.25 mg total) by mouth at bedtime.     QUEtiapine 400 MG 24 hr tablet  Commonly known as:  SEROQUEL XR  Take 400 mg by mouth  at bedtime.     ranitidine 150 MG tablet  Commonly known as:  ZANTAC  Take 1 tablet (150 mg total) by mouth 2 (two) times daily.     sertraline 100 MG tablet  Commonly known as:  ZOLOFT  Take 200 mg by mouth every morning.     Vitamin D-3 1000 UNITS Caps  Take 1 capsule by mouth daily.         SignedLuretha Murphy: [name] B 05/30/2014, 7:02 AM

## 2014-06-03 ENCOUNTER — Telehealth (HOSPITAL_COMMUNITY): Payer: Self-pay

## 2014-06-03 NOTE — Telephone Encounter (Signed)
Made discharge phone call to patient per DROP protocol. Asking the following questions.    1. Do you have someone to care for you now that you are home?  YES 2. Are you having pain now that is not relieved by your pain medication?  NO 3. Are you able to drink the recommended daily amount of fluids (48 ounces minimum/day) and protein (60-80 grams/day) as prescribed by the dietitian or nutritional counselor?  Yes to fluids. Only 30 mgs protein daily at this point because she was confused but will drink more to get 60.  4. Are you taking the vitamins and minerals as prescribed?  YES 5. Do you have the on call number to contact your surgeon if you have a problem or question?  YES 6. Are your incisions free of redness, swelling or drainage? (If steri strips, address that these can fall off, shower as tolerated) YES, no issues 7. Have your bowels moved since your surgery?  If not, are you passing gas?  YES, no issues Are you up and walking 3-4 times per day?  YES   1. Do you have an appointment made to see your surgeon in the next month?  YES 2. Were you provided your discharge medications before your surgery or before you were discharged from the hospital and are you taking them without problem?  Yes. Was taking only at night & nothing last night 3. Were you provided phone numbers to the clinic/surgeons office?  YES 4. Did you watch the patient education video module in the (clinic, surgeons office, etc.) before your surgery? YES 5. Do you have a discharge checklist that was provided to you in the hospital to reference with instructions on how to take care of yourself after surgery?  YES 6. Did you see a dietitian or nutritional counselor while you were in the hospital?  YES 7. Do you have an appointment to see a dietitian or nutritional counselor in the next month?  YES

## 2014-06-06 ENCOUNTER — Telehealth: Payer: Self-pay | Admitting: Dietician

## 2014-06-06 NOTE — Telephone Encounter (Signed)
Talked to Janet Turner today since she is having a "bad taste" in her mouth after having Premier Protein shakes. Suggested that she try Unflavored Unjury to add to cream soups instead of Premier or have boullion broth after shakes to counteract the sweet taste.

## 2014-06-11 ENCOUNTER — Ambulatory Visit (INDEPENDENT_AMBULATORY_CARE_PROVIDER_SITE_OTHER): Payer: 59 | Admitting: Family Medicine

## 2014-06-11 ENCOUNTER — Encounter: Payer: 59 | Attending: Surgery

## 2014-06-11 ENCOUNTER — Encounter: Payer: Self-pay | Admitting: Family Medicine

## 2014-06-11 VITALS — BP 114/72 | HR 84 | Temp 98.0°F | Wt 180.2 lb

## 2014-06-11 DIAGNOSIS — Z6841 Body Mass Index (BMI) 40.0 and over, adult: Secondary | ICD-10-CM | POA: Insufficient documentation

## 2014-06-11 DIAGNOSIS — K912 Postsurgical malabsorption, not elsewhere classified: Secondary | ICD-10-CM

## 2014-06-11 DIAGNOSIS — E669 Obesity, unspecified: Secondary | ICD-10-CM | POA: Diagnosis not present

## 2014-06-11 DIAGNOSIS — Z713 Dietary counseling and surveillance: Secondary | ICD-10-CM | POA: Diagnosis not present

## 2014-06-11 DIAGNOSIS — E785 Hyperlipidemia, unspecified: Secondary | ICD-10-CM

## 2014-06-11 DIAGNOSIS — I10 Essential (primary) hypertension: Secondary | ICD-10-CM

## 2014-06-11 LAB — LIPID PANEL
CHOLESTEROL: 159 mg/dL (ref 0–200)
HDL: 73.2 mg/dL (ref >39.00)
LDL Cholesterol: 68 mg/dL (ref 0–99)
NonHDL: 85.8
TRIGLYCERIDES: 88 mg/dL (ref 0.0–149.0)
Total CHOL/HDL Ratio: 2
VLDL: 17.6 mg/dL (ref 0.0–40.0)

## 2014-06-11 LAB — BASIC METABOLIC PANEL
BUN: 26 mg/dL — ABNORMAL HIGH (ref 6–23)
CHLORIDE: 95 meq/L — AB (ref 96–112)
CO2: 32 mEq/L (ref 19–32)
CREATININE: 1.22 mg/dL — AB (ref 0.40–1.20)
Calcium: 10.6 mg/dL — ABNORMAL HIGH (ref 8.4–10.5)
GFR: 49.67 mL/min — AB (ref >60.00)
GLUCOSE: 74 mg/dL (ref 70–99)
POTASSIUM: 5.3 meq/L — AB (ref 3.5–5.1)
Sodium: 136 mEq/L (ref 135–145)

## 2014-06-11 LAB — HEPATIC FUNCTION PANEL
ALT: 25 U/L (ref 0–35)
AST: 26 U/L (ref 0–37)
Albumin: 4.7 g/dL (ref 3.5–5.2)
Alkaline Phosphatase: 104 U/L (ref 39–117)
Bilirubin, Direct: 0.1 mg/dL (ref 0.0–0.3)
TOTAL PROTEIN: 8 g/dL (ref 6.0–8.3)
Total Bilirubin: 0.4 mg/dL (ref 0.2–1.2)

## 2014-06-11 MED ORDER — LISINOPRIL 5 MG PO TABS: 5.0000 mg | ORAL_TABLET | Freq: Every day | ORAL | Status: DC

## 2014-06-11 MED ORDER — CYANOCOBALAMIN 1000 MCG/ML IJ SOLN
1000.0000 ug | Freq: Once | INTRAMUSCULAR | Status: AC
  Administered 2014-06-11: 1000 ug via INTRAMUSCULAR

## 2014-06-11 MED ORDER — CYANOCOBALAMIN 1000 MCG/ML IJ SOLN: 1000.0000 ug | INTRAMUSCULAR | Status: DC

## 2014-06-11 NOTE — Progress Notes (Signed)
Subjective:    Patient ID: Janet Turner, female    DOB: 09/24/1965, 49 y.o.   MRN: [id number]  HPI  Patient here for bp and cholesterol f/u.   Past Medical History  Diagnosis Date  . Hypertension   . Hypoglycemia   . Hyperlipemia   . Leg pain   . Chicken pox   . Depression   . Heart murmur   . Hypopituitarism   . PTSD (post-traumatic stress disorder)   . Family history of adverse reaction to anesthesia     mother- nausea and vomiting   . Complication of anesthesia     oxygen level  and blood pressure dropped wtih anterior cervical fusion surgery   . Sleep apnea     no cpap   . Hypothyroidism   . GERD (gastroesophageal reflux disease)     Review of Systems  Constitutional: Negative for activity change, appetite change, fatigue and unexpected weight change.  Respiratory: Negative for cough and shortness of breath.   Cardiovascular: Negative for chest pain and palpitations.  Psychiatric/Behavioral: Negative for behavioral problems and dysphoric mood. The patient is not nervous/anxious.        Objective:    Physical Exam  Constitutional: She is oriented to person, place, and time. She appears well-developed and well-nourished. No distress.  HENT:  Right Ear: External ear normal.  Left Ear: External ear normal.  Nose: Nose normal.  Mouth/Throat: Oropharynx is clear and moist.  Eyes: EOM are normal. Pupils are equal, round, and reactive to light.  Neck: Normal range of motion. Neck supple.  Cardiovascular: Normal rate, regular rhythm and normal heart sounds.   No murmur heard. Pulmonary/Chest: Effort normal and breath sounds normal. No respiratory distress. She has no wheezes. She has no rales. She exhibits no tenderness.  Musculoskeletal: She exhibits no edema or tenderness.  Neurological: She is alert and oriented to person, place, and time.  Psychiatric: She has a normal mood and affect. Her behavior is normal. Judgment and thought content normal.    BP 114/72  mmHg  Pulse 84  Temp(Src) 98 F (36.7 C) (Oral)  Wt 180 lb 3.2 oz (81.738 kg)  SpO2 94% Wt Readings from Last 3 Encounters:  06/11/14 180 lb 3.2 oz (81.738 kg)  05/30/14 193 lb (87.544 kg)  05/21/14 192 lb (87.091 kg)     Lab Results  Component Value Date   WBC 8.8 05/30/2014   HGB 11.6* 05/30/2014   HCT 36.0 05/30/2014   PLT 288 05/30/2014   GLUCOSE 94 05/21/2014   CHOL 204* 02/12/2014   TRIG 133.0 02/12/2014   HDL 70.70 02/12/2014   LDLCALC 107* 02/12/2014   ALT 19 12/18/2013   AST 23 12/18/2013   NA 136 05/21/2014   K 4.8 05/21/2014   CL 99 05/21/2014   CREATININE 1.17* 05/28/2014   BUN 30* 05/21/2014   CO2 28 05/21/2014   TSH 1.100 11/27/2013    No results found.     Assessment & Plan:   Problem List Items Addressed This Visit    None    Visit Diagnoses    Postoperative malabsorption    -  Primary    Relevant Medications    cyanocobalamin injection 1000 mcg/mL    Essential hypertension        Relevant Medications    lisinopril (PRINIVIL,ZESTRIL) tablet    Other Relevant Orders    Basic metabolic panel    Hyperlipidemia        Relevant Medications  lisinopril (PRINIVIL,ZESTRIL) tablet    Other Relevant Orders    Basic metabolic panel    Hepatic function panel    Lipid panel        Loreen Freud, DO

## 2014-06-11 NOTE — Progress Notes (Signed)
Bariatric Class:  Appt start time: 1530 end time:  1630.  2 Week Post-Operative Nutrition Class  Patient was seen on 06/11/2014 for Post-Operative Nutrition education at [facility].   Surgery date: 05/28/14 Surgery type: gastric sleeve Start weight at [facility]: 191.5 lbs on 04/13/14 Weight today: 179.5 lbs  Weight change: 12.5 lbs  TANITA  BODY COMP RESULTS  05/20/14 06/11/14   BMI (kg/m^2) 41.5 38.8   Fat Mass (lbs) 92.5 84.0   Fat Free Mass (lbs) 99.5 95.5   Total Body Water (lbs) 73 70.0    The following the learning objectives were met by the patient during this course:  Identifies Phase 3A (Soft, High Proteins) Dietary Goals and will begin from 2 weeks post-operatively to 2 months post-operatively  Identifies appropriate sources of fluids and proteins   States protein recommendations and appropriate sources post-operatively  Identifies the need for appropriate texture modifications, mastication, and bite sizes when consuming solids  Identifies appropriate multivitamin and calcium sources post-operatively  Describes the need for physical activity post-operatively and will follow MD recommendations  States when to call healthcare provider regarding medication questions or post-operative complications  Handouts given during class include:  Phase 3A: Soft, High Protein Diet Handout  Follow-Up Plan: Patient will follow-up at [facility] in 6 weeks for 2 month post-op nutrition visit for diet advancement per MD.

## 2014-06-11 NOTE — Progress Notes (Signed)
Pre visit review using our clinic review tool, if applicable. No additional management support is needed unless otherwise documented below in the visit note. 

## 2014-06-11 NOTE — Patient Instructions (Signed)

## 2014-06-12 ENCOUNTER — Telehealth: Payer: Self-pay

## 2014-06-12 DIAGNOSIS — R7989 Other specified abnormal findings of blood chemistry: Secondary | ICD-10-CM

## 2014-06-12 DIAGNOSIS — R799 Abnormal finding of blood chemistry, unspecified: Secondary | ICD-10-CM

## 2014-06-12 MED ORDER — ATORVASTATIN CALCIUM 10 MG PO TABS: 10.0000 mg | ORAL_TABLET | Freq: Every day | ORAL | Status: DC

## 2014-06-12 NOTE — Telephone Encounter (Signed)
-----   Message from Lelon PerlaYvonne R Lowne, DO sent at 06/11/2014  7:03 PM EST ----- Decrease lipitor to 10 mg -- 1 po qhs #30   Pt needs to drink more fluids-- bun and cr are up Recheck --bmp in 2 weeks Recheck lipids in 2 month----lipid, hep ---

## 2014-06-12 NOTE — Telephone Encounter (Signed)
Discussed with patient and she verbalized understanding, she said she would increase her fluids and have requested the Lipitor 10 mg [facility].  Rx has been sent.       KP

## 2014-06-26 ENCOUNTER — Ambulatory Visit (INDEPENDENT_AMBULATORY_CARE_PROVIDER_SITE_OTHER): Payer: 59 | Admitting: Physician Assistant

## 2014-06-26 ENCOUNTER — Encounter: Payer: Self-pay | Admitting: Physician Assistant

## 2014-06-26 VITALS — BP 118/76 | HR 74 | Temp 98.3°F | Resp 16 | Ht <= 58 in | Wt 176.1 lb

## 2014-06-26 DIAGNOSIS — N3001 Acute cystitis with hematuria: Secondary | ICD-10-CM

## 2014-06-26 DIAGNOSIS — R8290 Unspecified abnormal findings in urine: Secondary | ICD-10-CM

## 2014-06-26 DIAGNOSIS — R399 Unspecified symptoms and signs involving the genitourinary system: Secondary | ICD-10-CM

## 2014-06-26 LAB — POCT URINALYSIS DIPSTICK
Bilirubin, UA: NEGATIVE
Glucose, UA: NEGATIVE
KETONES UA: NEGATIVE
NITRITE UA: NEGATIVE
Protein, UA: POSITIVE
Spec Grav, UA: 1.02
Urobilinogen, UA: 0.2
pH, UA: 8

## 2014-06-26 MED ORDER — CEPHALEXIN 500 MG PO CAPS: 500.0000 mg | ORAL_CAPSULE | Freq: Two times a day (BID) | ORAL | Status: DC

## 2014-06-26 NOTE — Patient Instructions (Signed)
Your symptoms are consistent with a bladder infection, also called acute cystitis. Please take your antibiotic (Keflex) as directed until all pills are gone.  Stay very well hydrated.  Consider a daily probiotic (Align, Culturelle, or Activia) to help prevent stomach upset caused by the antibiotic.  Taking a probiotic daily may also help prevent recurrent UTIs.  Also consider taking AZO (Phenazopyridine) tablets to help decrease pain with urination.  I will call you with your urine testing results.  We will change antibiotics if indicated.  Call or return to clinic if symptoms are not resolved by completion of antibiotic.   Urinary Tract Infection A urinary tract infection (UTI) can occur any place along the urinary tract. The tract includes the kidneys, ureters, bladder, and urethra. A type of germ called bacteria often causes a UTI. UTIs are often helped with antibiotic medicine.  HOME CARE   If given, take antibiotics as told by your doctor. Finish them even if you start to feel better.  Drink enough fluids to keep your pee (urine) clear or pale yellow.  Avoid tea, drinks with caffeine, and bubbly (carbonated) drinks.  Pee often. Avoid holding your pee in for a long time.  Pee before and after having sex (intercourse).  Wipe from front to back after you poop (bowel movement) if you are a woman. Use each tissue only once. GET HELP RIGHT AWAY IF:   You have back pain.  You have lower belly (abdominal) pain.  You have chills.  You feel sick to your stomach (nauseous).  You throw up (vomit).  Your burning or discomfort with peeing does not go away.  You have a fever.  Your symptoms are not better in 3 days. MAKE SURE YOU:   Understand these instructions.  Will watch your condition.  Will get help right away if you are not doing well or get worse. Document Released: 10/13/2007 Document Revised: 01/19/2012 Document Reviewed: 11/25/2011 Exit[employer] Patient Information 2015  [employer]. This information is not intended to replace advice given to you by your health care provider. Make sure you discuss any questions you have with your health care provider.   

## 2014-06-26 NOTE — Progress Notes (Signed)
ZOX:WRUEAVPCP:[name], DO Chief Complaint  Patient presents with  . Urinary Tract Infection    Pt c/o feeling of not emptying bladder, inguinal pain, hematuria x4 days    Current Issues:  Presents with 4 days of dysuria and urinary urgency Associated symptoms include:  chills, cloudy urine, hematuria and urinary urgency  There is a previous history of of similar symptoms. Sexually active:  Yes with female.   No concern for STI.  Prior to Admission medications   Medication Sig Start Date End Date Taking? Authorizing Provider  atorvastatin (LIPITOR) 10 MG tablet Take 1 tablet (10 mg total) by mouth daily. 06/12/14  Yes Lelon PerlaYvonne R Lowne, DO  Cholecalciferol (VITAMIN D-3) 1000 UNITS CAPS Take 1 capsule by mouth daily.    Yes Historical Provider, MD  cyanocobalamin (,VITAMIN B-12,) 1000 MCG/ML injection Inject 1 mL (1,000 mcg total) into the muscle every 30 (thirty) days. 06/11/14  Yes Lelon PerlaYvonne R Lowne, DO  lamoTRIgine (LAMICTAL) 150 MG tablet Take 150 mg by mouth every morning.   Yes Historical Provider, MD  levothyroxine (SYNTHROID, LEVOTHROID) 75 MCG tablet Take 1 tablet (75 mcg total) by mouth daily before breakfast. 02/25/14  Yes Yvonne R Lowne, DO  lisinopril (PRINIVIL,ZESTRIL) 5 MG tablet Take 1 tablet (5 mg total) by mouth daily. 06/11/14  Yes Lelon PerlaYvonne R Lowne, DO  Multiple Vitamin (MULTIVITAMIN) tablet Take 1 tablet by mouth daily.   Yes Historical Provider, MD  polyethylene glycol (MIRALAX / GLYCOLAX) packet Take 17 g by mouth daily. As needed   Yes Historical Provider, MD  pramipexole (MIRAPEX) 0.25 MG tablet Take 1 tablet (0.25 mg total) by mouth at bedtime. 04/29/14  Yes Yvonne R Lowne, DO  QUEtiapine (SEROQUEL XR) 400 MG 24 hr tablet Take 400 mg by mouth at bedtime.   Yes Historical Provider, MD  ranitidine (ZANTAC) 150 MG tablet Take 1 tablet (150 mg total) by mouth 2 (two) times daily. Patient taking differently: Take 150 mg by mouth 2 (two) times daily. As needed 03/19/14  Yes Waldon MerlWilliam C Shantese Raven,  PA-C  sertraline (ZOLOFT) 100 MG tablet Take 200 mg by mouth every morning.  12/18/13  Yes Lelon PerlaYvonne R Lowne, DO    Review of Systems:Pertinent ROS are listed in HPI.  PE:  BP 118/76 mmHg  Pulse 74  Temp(Src) 98.3 F (36.8 C) (Oral)  Resp 16  Ht 4\' 9"  (1.448 m)  Wt 176 lb 2 oz (79.89 kg)  BMI 38.10 kg/m2  SpO2 100%   BP 118/76 mmHg  Pulse 74  Temp(Src) 98.3 F (36.8 C) (Oral)  Resp 16  Ht 4\' 9"  (1.448 m)  Wt 176 lb 2 oz (79.89 kg)  BMI 38.10 kg/m2  SpO2 100% General appearance: alert, cooperative, appears stated age and no distress Lungs: clear to auscultation bilaterally Heart: regular rate and rhythm, S1, S2 normal, no murmur, click, rub or gallop Abdomen: soft, non-tender; bowel sounds normal; no masses,  no organomegaly and no CVA tenderness    Results for orders placed or performed in visit on 06/26/14  POCT urinalysis dipstick  Result Value Ref Range   Color, UA yellow    Clarity, UA cloudy    Glucose, UA neg    Bilirubin, UA neg    Ketones, UA neg    Spec Grav, UA 1.020    Blood, UA large 3+    pH, UA 8.0    Protein, UA positive 1+    Urobilinogen, UA 0.2    Nitrite, UA neg    Leukocytes,  UA large (3+)     Assessment and Plan:  Acute Cystitis Rx Keflex.  Increase fluids.  Cranberry supplement and probiotic. Will alter regimen if indicated by culture. - POCT urinalysis dipstick; Standing - POCT urinalysis dipstick - CULTURE, URINE COMPREHENSIVE

## 2014-06-27 LAB — CULTURE, URINE COMPREHENSIVE: Colony Count: 15000

## 2014-07-09 ENCOUNTER — Ambulatory Visit (INDEPENDENT_AMBULATORY_CARE_PROVIDER_SITE_OTHER): Payer: 59 | Admitting: *Deleted

## 2014-07-09 VITALS — BP 112/68

## 2014-07-09 DIAGNOSIS — E538 Deficiency of other specified B group vitamins: Secondary | ICD-10-CM

## 2014-07-09 DIAGNOSIS — R799 Abnormal finding of blood chemistry, unspecified: Secondary | ICD-10-CM

## 2014-07-09 DIAGNOSIS — R748 Abnormal levels of other serum enzymes: Secondary | ICD-10-CM

## 2014-07-09 DIAGNOSIS — R7989 Other specified abnormal findings of blood chemistry: Secondary | ICD-10-CM

## 2014-07-09 LAB — BASIC METABOLIC PANEL
BUN: 20 mg/dL (ref 6–23)
CO2: 32 mEq/L (ref 19–32)
Calcium: 10.1 mg/dL (ref 8.4–10.5)
Chloride: 102 mEq/L (ref 96–112)
Creatinine, Ser: 1.05 mg/dL (ref 0.40–1.20)
GFR: 59.05 mL/min — AB (ref >60.00)
GLUCOSE: 98 mg/dL (ref 70–99)
Potassium: 4.6 mEq/L (ref 3.5–5.1)
Sodium: 140 mEq/L (ref 135–145)

## 2014-07-09 MED ORDER — CYANOCOBALAMIN 1000 MCG/ML IJ SOLN
1000.0000 ug | Freq: Once | INTRAMUSCULAR | Status: AC
  Administered 2014-07-09: 1000 ug via INTRAMUSCULAR

## 2014-07-09 NOTE — Progress Notes (Signed)
Pre visit review using our clinic review tool, if applicable. No additional management support is needed unless otherwise documented below in the visit note.  Patient tolerated injection well.  Next appointment scheduled for 08/13/14.

## 2014-07-22 ENCOUNTER — Ambulatory Visit: Payer: Self-pay | Admitting: Dietician

## 2014-07-25 ENCOUNTER — Encounter: Payer: 59 | Attending: Surgery | Admitting: Dietician

## 2014-07-25 DIAGNOSIS — Z6841 Body Mass Index (BMI) 40.0 and over, adult: Secondary | ICD-10-CM | POA: Insufficient documentation

## 2014-07-25 DIAGNOSIS — E669 Obesity, unspecified: Secondary | ICD-10-CM | POA: Insufficient documentation

## 2014-07-25 DIAGNOSIS — Z713 Dietary counseling and surveillance: Secondary | ICD-10-CM | POA: Insufficient documentation

## 2014-07-25 NOTE — Progress Notes (Signed)
  Follow-up visit:  8 Weeks Post-Operative Gastric sleeve Surgery  Medical Nutrition Therapy:  Appt start time: 0800 end time:  0830.  Primary concerns today: Post-operative Bariatric Surgery Nutrition Management. Elnita MaxwellCheryl states that she is doing well. Can only tolerate 3-5 bites at a time. Tried ice cream and cannot tolerate it. She is not currently meeting her protein needs.  Non scale goals: tucking shirt in, wearing button up shirts  Surgery date: 05/28/14 Surgery type: gastric sleeve [facility]: 191.5 lbs on 04/13/14 Weight today: 165 lbs Weight change: 14.5 lbs Total weight lost: 26.5 lbs Weight loss goal: 110-120 lbs (size 6)  TANITA  BODY COMP RESULTS  05/20/14 06/11/14 07/25/14   BMI (kg/m^2) 41.5 38.8 35.7   Fat Mass (lbs) 92.5 84.0 68.5   Fat Free Mass (lbs) 99.5 95.5 96.5   Total Body Water (lbs) 73 70.0 70.5    Preferred Learning Style:   No preference indicated   Learning Readiness:   Ready  24-hr recall: B (AM): egg (7g) Snk (AM): cheese stick (6g)  L (PM): 1.5 oz grilled chicken or broiled fish with beans (10-12g) Snk (PM): usually none  D (PM): see lunch (10-12g) Snk (PM): sugar free popsicle  Fluid intake: "3/4 of needs" water, water with SF flavor, 16 ounces whole milk  Estimated total protein intake: ~37g   Medications: see list Supplementation: taking  Using straws: no Drinking while eating: no Hair loss: none Carbonated beverages: none N/V/D/C: regurgitation; a little bit of constipation, uses Miralax Dumping syndrome: none   Recent physical activity:  Treadmill for 20 minutes, elliptical for 10 minutes, bike for 20 minutes (almost every day)  Progress Towards Goal(s):  In progress.  Handouts given during visit include:  Phase 3B lean protein + non starchy vegetables   Nutritional Diagnosis:  [place]-3.3 Overweight/obesity related to past poor dietary habits and physical inactivity as evidenced by patient w/ recent gastric sleeve  surgery following dietary guidelines for continued weight loss.     Intervention:  Nutrition counseling provided.  Teaching Method Utilized:  Visual Auditory  Barriers to learning/adherence to lifestyle change: none  Demonstrated degree of understanding via:  Teach Back   Monitoring/Evaluation:  Dietary intake, exercise, and body weight. Follow up in 6 weeks for 3.5 month post-op visit.

## 2014-07-25 NOTE — Patient Instructions (Addendum)
Goals:  Follow Phase 3B: High Protein + Non-Starchy Vegetables  Eat 3-6 small meals/snacks, every 3-5 hrs  Increase lean protein foods to meet 60g goal  Increase fluid intake to 64oz +  Avoid drinking 15 minutes before, during and 30 minutes after eating  Aim for >30 min of physical activity daily  Try 2% milk  TANITA  BODY COMP RESULTS  05/20/14 06/11/14 07/25/14   BMI (kg/m^2) 41.5 38.8 35.7   Fat Mass (lbs) 92.5 84.0 68.5   Fat Free Mass (lbs) 99.5 95.5 96.5   Total Body Water (lbs) 73 70.0 70.5

## 2014-07-30 ENCOUNTER — Other Ambulatory Visit: Payer: Self-pay | Admitting: Family Medicine

## 2014-08-12 ENCOUNTER — Other Ambulatory Visit: Payer: Self-pay | Admitting: Family Medicine

## 2014-08-13 ENCOUNTER — Ambulatory Visit (INDEPENDENT_AMBULATORY_CARE_PROVIDER_SITE_OTHER): Payer: 59 | Admitting: *Deleted

## 2014-08-13 VITALS — BP 120/78

## 2014-08-13 DIAGNOSIS — E538 Deficiency of other specified B group vitamins: Secondary | ICD-10-CM | POA: Diagnosis not present

## 2014-08-13 MED ORDER — CYANOCOBALAMIN 1000 MCG/ML IJ SOLN
1000.0000 ug | Freq: Once | INTRAMUSCULAR | Status: AC
  Administered 2014-08-13: 1000 ug via INTRAMUSCULAR

## 2014-08-13 NOTE — Progress Notes (Signed)
Pre visit review using our clinic review tool, if applicable. No additional management support is needed unless otherwise documented below in the visit note.   Patient tolerated injection well.   Next injection due when patient has follow-up (note added to schedule to notify).

## 2014-09-03 ENCOUNTER — Other Ambulatory Visit: Payer: Self-pay | Admitting: Family Medicine

## 2014-09-05 ENCOUNTER — Encounter: Payer: 59 | Attending: Surgery | Admitting: Dietician

## 2014-09-05 ENCOUNTER — Encounter: Payer: Self-pay | Admitting: Dietician

## 2014-09-05 DIAGNOSIS — Z6841 Body Mass Index (BMI) 40.0 and over, adult: Secondary | ICD-10-CM | POA: Diagnosis not present

## 2014-09-05 DIAGNOSIS — Z713 Dietary counseling and surveillance: Secondary | ICD-10-CM | POA: Diagnosis not present

## 2014-09-05 DIAGNOSIS — E669 Obesity, unspecified: Secondary | ICD-10-CM | POA: Insufficient documentation

## 2014-09-05 NOTE — Progress Notes (Signed)
  Follow-up visit: 3 months Post-Operative Gastric sleeve Surgery  Medical Nutrition Therapy:  Appt start time: 0200 end time:  0230  Primary concerns today: Post-operative Bariatric Surgery Nutrition Management. Janet Turner returns stating she is doing well. She is excited about her weight loss. Still not meeting protein needs.   Non scale goals: tucking shirt in, wearing button up shirts  Surgery date: 05/28/14 Surgery type: gastric sleeve [facility]: [facility].5 lbs on 04/13/14 Weight today: 152 lbs Weight change: 13 lbs Total weight lost: 41 lbs Weight loss goal: 110-120 lbs (size 6)  TANITA  BODY COMP RESULTS  05/20/14 06/11/14 07/25/14 09/05/14   BMI (kg/m^2) 41.5 38.8 35.7 32.9   Fat Mass (lbs) 92.5 84.0 68.5 57   Fat Free Mass (lbs) 99.5 95.5 96.5 95   Total Body Water (lbs) 73 70.0 70.5 69.5    Preferred Learning Style:   No preference indicated   Learning Readiness:   Ready  24-hr recall: B (AM): egg and strip of bacon (8g) Snk (AM): 1 Graham cracker with peanut butter L (PM): 1.5 oz grilled chicken or broiled fish with vegetable or beans (10-12g) Snk (PM): fruit D (4:30PM): see lunch (10-12g) Snk (8PM): fruit  Fluid intake: 2% milk, 2 oz orange juice, mostly water (48 oz) Estimated total protein intake: ~37g   Medications: see list Supplementation: taking  Using straws: no Drinking while eating: no Hair loss: none Carbonated beverages: none N/V/D/C: regurgitation if she overeats; a little bit of constipation, uses Miralax Dumping syndrome: none   Recent physical activity:  walking  Progress Towards Goal(s):  In progress.    Nutritional Diagnosis:  [place]-3.3 Overweight/obesity related to past poor dietary habits and physical inactivity as evidenced by patient w/ recent gastric sleeve surgery following dietary guidelines for continued weight loss.     Intervention:  Nutrition counseling provided.  Teaching Method Utilized:  Visual Auditory  Barriers  to learning/adherence to lifestyle change: none  Demonstrated degree of understanding via:  Teach Back   Monitoring/Evaluation:  Dietary intake, exercise, and body weight. Follow up in 2 months for 5 month post-op visit.

## 2014-09-05 NOTE — Patient Instructions (Addendum)
Goals:  Follow Phase 3B: High Protein + Non-Starchy Vegetables  Eat 3-6 small meals/snacks, every 3-5 hrs  Increase lean protein foods to meet 60g goal  Increase fluid intake to 64oz +  Avoid drinking 15 minutes before, during and 30 minutes after eating  Aim for >30 min of physical activity daily  Add protein to snacks: cheese sticks   TANITA  BODY COMP RESULTS  05/20/14 06/11/14 07/25/14 09/05/14   BMI (kg/m^2) 41.5 38.8 35.7 32.9   Fat Mass (lbs) 92.5 84.0 68.5 57   Fat Free Mass (lbs) 99.5 95.5 96.5 95   Total Body Water (lbs) 73 70.0 70.5 69.5

## 2014-09-16 ENCOUNTER — Other Ambulatory Visit: Payer: Self-pay | Admitting: Family Medicine

## 2014-09-16 DIAGNOSIS — E785 Hyperlipidemia, unspecified: Secondary | ICD-10-CM

## 2014-09-19 ENCOUNTER — Other Ambulatory Visit: Payer: Self-pay | Admitting: Family Medicine

## 2014-09-19 ENCOUNTER — Encounter: Payer: Self-pay | Admitting: Family Medicine

## 2014-09-19 ENCOUNTER — Ambulatory Visit (INDEPENDENT_AMBULATORY_CARE_PROVIDER_SITE_OTHER): Payer: 59 | Admitting: Family Medicine

## 2014-09-19 VITALS — BP 118/72 | HR 66 | Temp 98.7°F | Wt 145.4 lb

## 2014-09-19 DIAGNOSIS — I10 Essential (primary) hypertension: Secondary | ICD-10-CM

## 2014-09-19 DIAGNOSIS — Z1231 Encounter for screening mammogram for malignant neoplasm of breast: Secondary | ICD-10-CM

## 2014-09-19 DIAGNOSIS — E785 Hyperlipidemia, unspecified: Secondary | ICD-10-CM | POA: Diagnosis not present

## 2014-09-19 DIAGNOSIS — M8580 Other specified disorders of bone density and structure, unspecified site: Secondary | ICD-10-CM

## 2014-09-19 DIAGNOSIS — E038 Other specified hypothyroidism: Secondary | ICD-10-CM

## 2014-09-19 DIAGNOSIS — K912 Postsurgical malabsorption, not elsewhere classified: Secondary | ICD-10-CM | POA: Diagnosis not present

## 2014-09-19 DIAGNOSIS — R319 Hematuria, unspecified: Secondary | ICD-10-CM

## 2014-09-19 DIAGNOSIS — E538 Deficiency of other specified B group vitamins: Secondary | ICD-10-CM

## 2014-09-19 DIAGNOSIS — Z803 Family history of malignant neoplasm of breast: Secondary | ICD-10-CM

## 2014-09-19 DIAGNOSIS — Z9889 Other specified postprocedural states: Secondary | ICD-10-CM

## 2014-09-19 LAB — POCT URINALYSIS DIPSTICK
Bilirubin, UA: NEGATIVE
Glucose, UA: NEGATIVE
KETONES UA: NEGATIVE
LEUKOCYTES UA: NEGATIVE
NITRITE UA: NEGATIVE
PH UA: 8.5
RBC UA: NEGATIVE
Spec Grav, UA: 1.015
Urobilinogen, UA: 4

## 2014-09-19 LAB — CBC WITH DIFFERENTIAL/PLATELET
BASOS ABS: 0.1 10*3/uL (ref 0.0–0.1)
Basophils Relative: 1.4 % (ref 0.0–3.0)
EOS PCT: 2.6 % (ref 0.0–5.0)
Eosinophils Absolute: 0.1 10*3/uL (ref 0.0–0.7)
HEMATOCRIT: 37.1 % (ref 36.0–46.0)
HEMOGLOBIN: 12.7 g/dL (ref 12.0–15.0)
Lymphocytes Relative: 37 % (ref 12.0–46.0)
Lymphs Abs: 1.8 10*3/uL (ref 0.7–4.0)
MCHC: 34.1 g/dL (ref 30.0–36.0)
MCV: 87 fl (ref 78.0–100.0)
MONOS PCT: 6.5 % (ref 3.0–12.0)
Monocytes Absolute: 0.3 10*3/uL (ref 0.1–1.0)
Neutro Abs: 2.5 10*3/uL (ref 1.4–7.7)
Neutrophils Relative %: 52.5 % (ref 43.0–77.0)
PLATELETS: 214 10*3/uL (ref 150.0–400.0)
RBC: 4.27 Mil/uL (ref 3.87–5.11)
RDW: 14.9 % (ref 11.5–15.5)
WBC: 4.7 10*3/uL (ref 4.0–10.5)

## 2014-09-19 LAB — LIPID PANEL
Cholesterol: 195 mg/dL (ref 0–200)
HDL: 76.6 mg/dL (ref >39.00)
LDL CALC: 99 mg/dL (ref 0–99)
NonHDL: 118.4
TRIGLYCERIDES: 99 mg/dL (ref 0.0–149.0)
Total CHOL/HDL Ratio: 3
VLDL: 19.8 mg/dL (ref 0.0–40.0)

## 2014-09-19 LAB — MICROALBUMIN / CREATININE URINE RATIO
Creatinine,U: 0.1 mg/dL
Microalb Creat Ratio: 1166.7 mg/g — ABNORMAL HIGH (ref 0.0–30.0)
Microalb, Ur: <0.7 mg/dL (ref 0.0–1.9)

## 2014-09-19 LAB — BASIC METABOLIC PANEL
BUN: 15 mg/dL (ref 6–23)
CO2: 29 meq/L (ref 19–32)
Calcium: 10 mg/dL (ref 8.4–10.5)
Chloride: 101 mEq/L (ref 96–112)
Creatinine, Ser: 1.03 mg/dL (ref 0.40–1.20)
GFR: 60.32 mL/min (ref >60.00)
Glucose, Bld: 61 mg/dL — ABNORMAL LOW (ref 70–99)
Potassium: 4 mEq/L (ref 3.5–5.1)
SODIUM: 139 meq/L (ref 135–145)

## 2014-09-19 LAB — HEPATIC FUNCTION PANEL
ALK PHOS: 70 U/L (ref 39–117)
ALT: 18 U/L (ref 0–35)
AST: 26 U/L (ref 0–37)
Albumin: 4 g/dL (ref 3.5–5.2)
BILIRUBIN DIRECT: 0.1 mg/dL (ref 0.0–0.3)
TOTAL PROTEIN: 6.9 g/dL (ref 6.0–8.3)
Total Bilirubin: 0.5 mg/dL (ref 0.2–1.2)

## 2014-09-19 LAB — VITAMIN B12

## 2014-09-19 LAB — HEMOGLOBIN A1C: Hgb A1c MFr Bld: 5.4 % (ref 4.6–6.5)

## 2014-09-19 LAB — TSH: TSH: 0.45 u[IU]/mL (ref 0.35–4.50)

## 2014-09-19 MED ORDER — CYANOCOBALAMIN 1000 MCG/ML IJ SOLN
1000.0000 ug | Freq: Once | INTRAMUSCULAR | Status: AC
  Administered 2014-09-19: 1000 ug via INTRAMUSCULAR

## 2014-09-19 NOTE — Assessment & Plan Note (Signed)
Check labs con't synthroid 

## 2014-09-19 NOTE — Progress Notes (Signed)
Pre visit review using our clinic review tool, if applicable. No additional management support is needed unless otherwise documented below in the visit note. 

## 2014-09-19 NOTE — Patient Instructions (Signed)

## 2014-09-19 NOTE — Assessment & Plan Note (Signed)
Stable con't lisinopril  

## 2014-09-19 NOTE — Progress Notes (Signed)
Patient ID: Janet Turner, female    DOB: [birth year]  Age: 49 y.o. MRN: [id number]    Subjective:  Subjective HPI AKIVA JOSEY presents for f/u bp and cholesterol. No complaints.   Review of Systems  Constitutional: Negative for diaphoresis, appetite change, fatigue and unexpected weight change.  Eyes: Negative for pain, redness and visual disturbance.  Respiratory: Negative for cough, chest tightness, shortness of breath and wheezing.   Cardiovascular: Negative for chest pain, palpitations and leg swelling.  Endocrine: Negative for cold intolerance, heat intolerance, polydipsia, polyphagia and polyuria.  Genitourinary: Negative for dysuria, frequency and difficulty urinating.  Neurological: Negative for dizziness, light-headedness, numbness and headaches.  Psychiatric/Behavioral: Negative for dysphoric mood. The patient is not nervous/anxious.     History Past Medical History  Diagnosis Date  . Hypertension   . Hypoglycemia   . Hyperlipemia   . Leg pain   . Chicken pox   . Depression   . Heart murmur   . Hypopituitarism   . PTSD (post-traumatic stress disorder)   . Family history of adverse reaction to anesthesia     mother- nausea and vomiting   . Complication of anesthesia     oxygen level  and blood pressure dropped wtih anterior cervical fusion surgery   . Sleep apnea     no cpap   . Hypothyroidism   . GERD (gastroesophageal reflux disease)     She has past surgical history that includes Eye surgery (39,98,10); Cholecystectomy (89); Cesarean section (89); Breast reduction surgery (96); Anterior cervical discectomy (2013); ganglion cyst removed; Laparoscopic gastric sleeve resection (N/A, 05/28/2014); and Upper gi endoscopy (N/A, 05/28/2014).   Her family history includes Alcoholism in an other family member; Anuerysm in an other family member; Breast cancer in her maternal grandmother; Cancer in her father; Cancer (age of onset: 72) in her mother; Depression in her  father; Hypertension in her father and another family member; Lung cancer in her maternal grandmother; Melanoma in her father; Osteoarthritis in her mother; Ovarian cancer in her maternal grandmother; Prostate cancer in her paternal grandfather; Stroke in an other family member.She reports that she has never smoked. She has never used smokeless tobacco. She reports that she does not drink alcohol or use illicit drugs.  Current Outpatient Prescriptions on File Prior to Visit  Medication Sig Dispense Refill  . atorvastatin (LIPITOR) 10 MG tablet Take 1 tablet (10 mg total) by mouth daily. Repeat labs are due now 30 tablet 5  . Cholecalciferol (VITAMIN D-3) 1000 UNITS CAPS Take 1 capsule by mouth daily.     . cyanocobalamin (,VITAMIN B-12,) 1000 MCG/ML injection Inject 1 mL (1,000 mcg total) into the muscle every 30 (thirty) days. 1 mL 0  . lamoTRIgine (LAMICTAL) 150 MG tablet Take 150 mg by mouth every morning.    Marland Kitchen levothyroxine (SYNTHROID, LEVOTHROID) 75 MCG tablet TAKE 1 TABLET BY MOUTH DAILY BEFORE BREAKFAST. 30 tablet 3  . lisinopril (PRINIVIL,ZESTRIL) 5 MG tablet TAKE 1 TABLET BY MOUTH ONCE DAILY 30 tablet 5  . Multiple Vitamin (MULTIVITAMIN) tablet Take 1 tablet by mouth daily.    . polyethylene glycol (MIRALAX / GLYCOLAX) packet Take 17 g by mouth daily. As needed    . pramipexole (MIRAPEX) 0.25 MG tablet TAKE 1 TABLET BY MOUTH AT BEDTIME. 30 tablet 5  . QUEtiapine (SEROQUEL XR) 400 MG 24 hr tablet Take 400 mg by mouth at bedtime.    . ranitidine (ZANTAC) 150 MG tablet Take 1 tablet (150 mg total) by mouth  2 (two) times daily. (Patient taking differently: Take 150 mg by mouth 2 (two) times daily. As needed) 60 tablet 0  . sertraline (ZOLOFT) 100 MG tablet Take 200 mg by mouth every morning.      No current facility-administered medications on file prior to visit.     Objective:  Objective Physical Exam  Constitutional: She is oriented to person, place, and time. She appears  well-developed and well-nourished.  HENT:  Head: Normocephalic and atraumatic.  Eyes: Conjunctivae and EOM are normal.  Neck: Normal range of motion. Neck supple. No JVD present. Carotid bruit is not present. No thyromegaly present.  Cardiovascular: Normal rate, regular rhythm and normal heart sounds.   No murmur heard. Pulmonary/Chest: Effort normal and breath sounds normal. No respiratory distress. She has no wheezes. She has no rales. She exhibits no tenderness.  Musculoskeletal: She exhibits no edema.  Neurological: She is alert and oriented to person, place, and time.  Psychiatric: She has a normal mood and affect. Her behavior is normal.   BP 118/72 mmHg  Pulse 66  Temp(Src) 98.7 F (37.1 C) (Oral)  Wt 145 lb 6.4 oz (65.953 kg)  SpO2 97% Wt Readings from Last 3 Encounters:  09/19/14 145 lb 6.4 oz (65.953 kg)  09/05/14 152 lb (68.947 kg)  06/26/14 176 lb 2 oz (79.89 kg)     Lab Results  Component Value Date   WBC 8.8 05/30/2014   HGB 11.6* 05/30/2014   HCT 36.0 05/30/2014   PLT 288 05/30/2014   GLUCOSE 98 07/09/2014   CHOL 159 06/11/2014   TRIG 88.0 06/11/2014   HDL 73.20 06/11/2014   LDLCALC 68 06/11/2014   ALT 25 06/11/2014   AST 26 06/11/2014   NA 140 07/09/2014   K 4.6 07/09/2014   CL 102 07/09/2014   CREATININE 1.05 07/09/2014   BUN 20 07/09/2014   CO2 32 07/09/2014   TSH 1.100 11/27/2013    No results found.   Assessment & Plan:  Plan I have discontinued [name]'s cephALEXin. I am also having her maintain her sertraline, QUEtiapine, multivitamin, Vitamin D-3, ranitidine, polyethylene glycol, lamoTRIgine, cyanocobalamin, pramipexole, lisinopril, levothyroxine, and atorvastatin. We administered cyanocobalamin.  Meds ordered this encounter  Medications  . cyanocobalamin ((VITAMIN B-12)) injection 1,000 mcg    Sig:     Problem List Items Addressed This Visit    Hypothyroidism    Check labs con't synthroid      HTN (hypertension) - Primary     Stable con't lisinopril       Other Visit Diagnoses    Encounter for screening mammogram for breast cancer        Relevant Orders    MM Digital Screening    Postsurgical malabsorption        Relevant Orders    Basic metabolic panel    Hepatic function panel    Lipid panel    Vitamin B12    Vitamin D 1,25 dihydroxy    TSH    CBC with Differential/Platelet    Hemoglobin A1c    Microalbumin / creatinine urine ratio    POCT urinalysis dipstick    Hyperlipidemia LDL goal <100        Relevant Orders    Basic metabolic panel    Hepatic function panel    Lipid panel    Vitamin B12    Vitamin D 1,25 dihydroxy    TSH    CBC with Differential/Platelet    Hemoglobin A1c    Microalbumin /  creatinine urine ratio    POCT urinalysis dipstick    B12 deficiency        Relevant Medications    cyanocobalamin ((VITAMIN B-12)) injection 1,000 mcg (Completed) (Start on 09/19/2014 11:15 AM)       Follow-up: Return in about 6 months (around 03/22/2015), or if symptoms worsen or fail to improve.  Loreen FreudYvonne Lowne, DO

## 2014-09-22 LAB — URINE CULTURE: Colony Count: >=100000

## 2014-09-23 LAB — VITAMIN D 1,25 DIHYDROXY
Vitamin D 1, 25 (OH)2 Total: 34 pg/mL (ref 18–72)
Vitamin D2 1, 25 (OH)2: <8 pg/mL
Vitamin D3 1, 25 (OH)2: 34 pg/mL

## 2014-09-25 ENCOUNTER — Other Ambulatory Visit: Payer: Self-pay

## 2014-09-25 MED ORDER — CIPROFLOXACIN HCL 250 MG PO TABS: 250.0000 mg | ORAL_TABLET | Freq: Two times a day (BID) | ORAL | Status: DC

## 2014-10-22 ENCOUNTER — Ambulatory Visit (INDEPENDENT_AMBULATORY_CARE_PROVIDER_SITE_OTHER): Payer: 59 | Admitting: *Deleted

## 2014-10-22 DIAGNOSIS — E538 Deficiency of other specified B group vitamins: Secondary | ICD-10-CM | POA: Diagnosis not present

## 2014-10-22 MED ORDER — CYANOCOBALAMIN 1000 MCG/ML IJ SOLN
1000.0000 ug | Freq: Once | INTRAMUSCULAR | Status: AC
  Administered 2014-10-22: 1000 ug via INTRAMUSCULAR

## 2014-10-22 NOTE — Progress Notes (Signed)
Pre visit review using our clinic review tool, if applicable. No additional management support is needed unless otherwise documented below in the visit note.  Patient tolerated injection well.  Next injection scheduled 11/20/14.

## 2014-10-31 ENCOUNTER — Ambulatory Visit: Payer: Self-pay | Admitting: Dietician

## 2014-11-18 ENCOUNTER — Telehealth: Payer: Self-pay | Admitting: Family Medicine

## 2014-11-18 MED ORDER — PRAMIPEXOLE DIHYDROCHLORIDE 0.5 MG PO TABS: 0.5000 mg | ORAL_TABLET | Freq: Every day | ORAL | Status: DC

## 2014-11-18 NOTE — Telephone Encounter (Signed)
Inc to 0.5 mg qhs

## 2014-11-18 NOTE — Telephone Encounter (Signed)
Rx faxed and VM left making the patient aware.       KP 

## 2014-11-18 NOTE — Telephone Encounter (Signed)
Please advise      KP 

## 2014-11-18 NOTE — Telephone Encounter (Signed)
Caller name: Hardie LoraCheryl Hudson Relationship to patient: self Can be reached: [phone] [facility]  Reason for call: Pt states that restless leg is keeping her up again. The past month on and off she is having trouble sleeping. She is asking if she could get an increased dose of Mirapex.

## 2014-11-19 ENCOUNTER — Ambulatory Visit
Admission: RE | Admit: 2014-11-19 | Discharge: 2014-11-19 | Disposition: A | Discharge status: Home / Self Care | Payer: 59 | Source: Ambulatory Visit | Attending: Family Medicine | Admitting: Family Medicine

## 2014-11-19 ENCOUNTER — Ambulatory Visit (INDEPENDENT_AMBULATORY_CARE_PROVIDER_SITE_OTHER): Payer: 59 | Admitting: Family Medicine

## 2014-11-19 ENCOUNTER — Emergency Department (INDEPENDENT_AMBULATORY_CARE_PROVIDER_SITE_OTHER)
Admission: EM | Admit: 2014-11-19 | Discharge: 2014-11-19 | Disposition: A | Discharge status: Home / Self Care | Payer: 59 | Source: Home / Self Care | Attending: Emergency Medicine | Admitting: Emergency Medicine

## 2014-11-19 ENCOUNTER — Encounter: Payer: Self-pay | Admitting: Family Medicine

## 2014-11-19 ENCOUNTER — Encounter (HOSPITAL_COMMUNITY): Payer: Self-pay | Admitting: Emergency Medicine

## 2014-11-19 VITALS — BP 110/68 | HR 74 | Temp 98.7°F | Ht <= 58 in | Wt 139.2 lb

## 2014-11-19 DIAGNOSIS — G4710 Hypersomnia, unspecified: Secondary | ICD-10-CM | POA: Diagnosis not present

## 2014-11-19 DIAGNOSIS — G2581 Restless legs syndrome: Secondary | ICD-10-CM | POA: Insufficient documentation

## 2014-11-19 DIAGNOSIS — M8580 Other specified disorders of bone density and structure, unspecified site: Secondary | ICD-10-CM

## 2014-11-19 DIAGNOSIS — E538 Deficiency of other specified B group vitamins: Secondary | ICD-10-CM | POA: Diagnosis not present

## 2014-11-19 DIAGNOSIS — Z803 Family history of malignant neoplasm of breast: Secondary | ICD-10-CM

## 2014-11-19 DIAGNOSIS — Z1231 Encounter for screening mammogram for malignant neoplasm of breast: Secondary | ICD-10-CM

## 2014-11-19 DIAGNOSIS — G4730 Sleep apnea, unspecified: Secondary | ICD-10-CM

## 2014-11-19 DIAGNOSIS — Z9889 Other specified postprocedural states: Secondary | ICD-10-CM

## 2014-11-19 DIAGNOSIS — H6012 Cellulitis of left external ear: Secondary | ICD-10-CM

## 2014-11-19 MED ORDER — CYANOCOBALAMIN 1000 MCG/ML IJ SOLN
1000.0000 ug | Freq: Once | INTRAMUSCULAR | Status: AC
  Administered 2014-11-19: 1000 ug via INTRAMUSCULAR

## 2014-11-19 MED ORDER — DOXYCYCLINE HYCLATE 100 MG PO CAPS: 100.0000 mg | ORAL_CAPSULE | Freq: Two times a day (BID) | ORAL | Status: DC

## 2014-11-19 MED ORDER — HYDROCODONE-ACETAMINOPHEN 5-325 MG PO TABS: 1.0000 | ORAL_TABLET | ORAL | Status: DC | PRN

## 2014-11-19 NOTE — Patient Instructions (Signed)
Sleep Apnea  Sleep apnea is a sleep disorder characterized by abnormal pauses in breathing while you sleep. When your breathing pauses, the level of oxygen in your blood decreases. This causes you to move out of deep sleep and into light sleep. As a result, your quality of sleep is poor, and the system that carries your blood throughout your body (cardiovascular system) experiences stress. If sleep apnea remains untreated, the following conditions can develop:  High blood pressure (hypertension).  Coronary artery disease.  Inability to achieve or maintain an erection (impotence).  Impairment of your thought process (cognitive dysfunction). There are three types of sleep apnea: 1. Obstructive sleep apnea--Pauses in breathing during sleep because of a blocked airway. 2. Central sleep apnea--Pauses in breathing during sleep because the area of the brain that controls your breathing does not send the correct signals to the muscles that control breathing. 3. Mixed sleep apnea--A combination of both obstructive and central sleep apnea. RISK FACTORS The following risk factors can increase your risk of developing sleep apnea:  Being overweight.  Smoking.  Having narrow passages in your nose and throat.  Being of older age.  Being female.  Alcohol use.  Sedative and tranquilizer use.  Ethnicity. Among individuals younger than 35 years, African Americans are at increased risk of sleep apnea. SYMPTOMS   Difficulty staying asleep.  Daytime sleepiness and fatigue.  Loss of energy.  Irritability.  Loud, heavy snoring.  Morning headaches.  Trouble concentrating.  Forgetfulness.  Decreased interest in sex. DIAGNOSIS  In order to diagnose sleep apnea, your caregiver will perform a physical examination. Your caregiver may suggest that you take a home sleep test. Your caregiver may also recommend that you spend the night in a sleep lab. In the sleep lab, several monitors record  information about your heart, lungs, and brain while you sleep. Your leg and arm movements and blood oxygen level are also recorded. TREATMENT The following actions may help to resolve mild sleep apnea:  Sleeping on your side.   Using a decongestant if you have nasal congestion.   Avoiding the use of depressants, including alcohol, sedatives, and narcotics.   Losing weight and modifying your diet if you are overweight. There also are devices and treatments to help open your airway:  Oral appliances. These are custom-made mouthpieces that shift your lower jaw forward and slightly open your bite. This opens your airway.  Devices that create positive airway pressure. This positive pressure "splints" your airway open to help you breathe better during sleep. The following devices create positive airway pressure:  Continuous positive airway pressure (CPAP) device. The CPAP device creates a continuous level of air pressure with an air pump. The air is delivered to your airway through a mask while you sleep. This continuous pressure keeps your airway open.  Nasal expiratory positive airway pressure (EPAP) device. The EPAP device creates positive air pressure as you exhale. The device consists of single-use valves, which are inserted into each nostril and held in place by adhesive. The valves create very little resistance when you inhale but create much more resistance when you exhale. That increased resistance creates the positive airway pressure. This positive pressure while you exhale keeps your airway open, making it easier to breath when you inhale again.  Bilevel positive airway pressure (BPAP) device. The BPAP device is used mainly in patients with central sleep apnea. This device is similar to the CPAP device because it also uses an air pump to deliver continuous air pressure   through a mask. However, with the BPAP machine, the pressure is set at two different levels. The pressure when you  exhale is lower than the pressure when you inhale.  Surgery. Typically, surgery is only done if you cannot comply with less invasive treatments or if the less invasive treatments do not improve your condition. Surgery involves removing excess tissue in your airway to create a wider passage way. Document Released: 04/16/2002 Document Revised: 08/21/2012 Document Reviewed: 09/02/2011 ExitCare Patient Information 2015 [employer]. This information is not intended to replace advice given to you by your health care provider. Make sure you discuss any questions you have with your health care provider.  

## 2014-11-19 NOTE — Assessment & Plan Note (Signed)
mirapex sent to pharmacy

## 2014-11-19 NOTE — Assessment & Plan Note (Signed)
b12 injection given today

## 2014-11-19 NOTE — ED Provider Notes (Signed)
CSN: [id number]     Arrival date & time 11/19/14  1850 History   First MD Initiated Contact with Patient 11/19/14 1901     Chief Complaint  Patient presents with  . Otalgia   (Consider location/radiation/quality/duration/timing/severity/associated sxs/prior Treatment) HPI  She is a 49 year old woman here for evaluation of left ear pain. She states she got a piercing on Friday in the superior aspect of her ear. Over the next 2 days it became more painful, red, and swollen. She removed the piercing 2 days ago. The redness, warmth, swelling has continued. She states it throbs. It is making it difficult for her to sleep at night. No fevers or chills.  Past Medical History  Diagnosis Date  . Hypertension   . Hypoglycemia   . Hyperlipemia   . Leg pain   . Chicken pox   . Depression   . Heart murmur   . Hypopituitarism   . PTSD (post-traumatic stress disorder)   . Family history of adverse reaction to anesthesia     mother- nausea and vomiting   . Complication of anesthesia     oxygen level  and blood pressure dropped wtih anterior cervical fusion surgery   . Sleep apnea     no cpap   . Hypothyroidism   . GERD (gastroesophageal reflux disease)    Past Surgical History  Procedure Laterality Date  . Eye surgery  [phone],70,82    x's 3   . Cholecystectomy  97  . Cesarean section  91  . Breast reduction surgery  96  . Anterior cervical discectomy  2013    Ant Cervical Fusion C4-5 with diskectomy  . Ganglion cyst removed    . Laparoscopic gastric sleeve resection N/A 05/28/2014    Procedure: LAPAROSCOPIC GASTRIC SLEEVE RESECTION;  Surgeon: Valarie MerinoMatthew B Martin, MD;  Location: [facility];  Service: General;  Laterality: N/A;  . Upper gi endoscopy N/A 05/28/2014    Procedure: UPPER GI ENDOSCOPY;  Surgeon: Valarie MerinoMatthew B Martin, MD;  Location: [facility];  Service: General;  Laterality: N/A;   Family History  Problem Relation Age of Onset  . Osteoarthritis Mother   . Cancer Mother [id number]    breast--stage III   invasive ductal ca  . Hypertension Father   . Melanoma Father   . Depression Father     PTSD  . Cancer Father     melanoma  . Alcoholism    . Ovarian cancer Maternal Grandmother   . Breast cancer Maternal Grandmother   . Lung cancer Maternal Grandmother   . Prostate cancer Paternal Grandfather   . Stroke      Paternal Family--9/12 children after age 50  . Anuerysm      Paternal family  . Hypertension      Paternal family   History  Substance Use Topics  . Smoking status: Never Smoker   . Smokeless tobacco: Never Used  . Alcohol Use: No   OB History    No data available     Review of Systems As in history of present illness Allergies  Penicillins  Home Medications   Prior to Admission medications   Medication Sig Start Date End Date Taking? Authorizing Provider  atorvastatin (LIPITOR) 10 MG tablet Take 1 tablet (10 mg total) by mouth daily. Repeat labs are due now 09/16/14   Lelon PerlaYvonne R Lowne, DO  Cholecalciferol (VITAMIN D-3) 1000 UNITS CAPS Take 1 capsule by mouth daily.     Historical Provider, MD  cyanocobalamin (,VITAMIN B-12,) 1000 MCG/ML  injection Inject 1 mL (1,000 mcg total) into the muscle every 30 (thirty) days. 06/11/14   Lelon Perla, DO  doxycycline (VIBRAMYCIN) 100 MG capsule Take 1 capsule (100 mg total) by mouth 2 (two) times daily. 11/19/14   Charm Rings, MD  HYDROcodone-acetaminophen (NORCO/VICODIN) 5-325 MG per tablet Take 1 tablet by mouth every 4 (four) hours as needed for moderate pain. 11/19/14   Charm Rings, MD  lamoTRIgine (LAMICTAL) 150 MG tablet Take 150 mg by mouth every morning.    Historical Provider, MD  levothyroxine (SYNTHROID, LEVOTHROID) 75 MCG tablet TAKE 1 TABLET BY MOUTH DAILY BEFORE BREAKFAST. 09/03/14   Lelon Perla, DO  lisinopril (PRINIVIL,ZESTRIL) 5 MG tablet TAKE 1 TABLET BY MOUTH ONCE DAILY 08/12/14   Lelon Perla, DO  Multiple Vitamin (MULTIVITAMIN) tablet Take 1 tablet by mouth daily.    Historical Provider, MD  polyethylene  glycol (MIRALAX / GLYCOLAX) packet Take 17 g by mouth daily. As needed    Historical Provider, MD  pramipexole (MIRAPEX) 0.5 MG tablet Take 1 tablet (0.5 mg total) by mouth at bedtime. 11/18/14   Lelon Perla, DO  QUEtiapine (SEROQUEL XR) 400 MG 24 hr tablet Take 400 mg by mouth at bedtime.    Historical Provider, MD  ranitidine (ZANTAC) 150 MG tablet Take 1 tablet (150 mg total) by mouth 2 (two) times daily. Patient taking differently: Take 150 mg by mouth 2 (two) times daily. As needed 03/19/14   Waldon Merl, PA-C  sertraline (ZOLOFT) 100 MG tablet Take 200 mg by mouth every morning.  12/18/13   Grayling Congress Lowne, DO   BP 127/79 mmHg  Pulse 67  Temp(Src) 97.9 F (36.6 C) (Oral)  Resp 16  SpO2 97% Physical Exam  Constitutional: She is oriented to person, place, and time. She appears well-developed and well-nourished. No distress.  Cardiovascular: Normal rate.   Pulmonary/Chest: Effort normal.  Neurological: She is alert and oriented to person, place, and time.  Skin:  Left pinna is erythematous and swollen. There is a small area of fluctuance next to the site of the piercing    ED Course  INCISION AND DRAINAGE Date/Time: 11/19/2014 7:25 PM Performed by: Charm Rings Authorized by: Charm Rings Consent: Verbal consent obtained. Risks and benefits: risks, benefits and alternatives were discussed Consent given by: patient Patient understanding: patient states understanding of the procedure being performed Patient identity confirmed: verbally with patient Time out: Immediately prior to procedure a "time out" was called to verify the correct patient, procedure, equipment, support staff and site/side marked as required. Type: abscess Body area: head/neck Location details: left external ear Anesthesia: local infiltration Local anesthetic: lidocaine 2% without epinephrine Anesthetic total: 0.5 ml Scalpel size: 11 Complexity: simple Drainage: bloody and  purulent Drainage amount:  scant Wound treatment: wound left open Patient tolerance: Patient tolerated the procedure well with no immediate complications   (including critical care time) Labs Review Labs Reviewed - No data to display  Imaging Review No results found.   MDM   1. Cellulitis of left ear    Doxycycline for 10 days. Norco to use as needed for pain. Follow-up as needed.    Charm Rings, MD 11/19/14 [phone]

## 2014-11-19 NOTE — Progress Notes (Signed)
Patient ID: Janet Turner, female    DOB: [birth year]  Age: 49 y.o. MRN: [id number]    Subjective:  Subjective HPI Janet Turner presents for f/u sleep apnea. She was dx with sleep apnea about 2-3 years ago at [facility] and was on a cpap machine but she lost weight and she thought she was better and returned the cpap.   She also has RLS-- we increased her mirapex yesterday.  She has not picked it up yet.  She snores badly per pt.    Review of Systems  Constitutional: Negative for diaphoresis, appetite change, fatigue and unexpected weight change.  Eyes: Negative for pain, redness and visual disturbance.  Respiratory: Negative for cough, chest tightness, shortness of breath and wheezing.   Cardiovascular: Negative for chest pain, palpitations and leg swelling.  Endocrine: Negative for cold intolerance, heat intolerance, polydipsia, polyphagia and polyuria.  Genitourinary: Negative for dysuria, frequency and difficulty urinating.  Neurological: Negative for dizziness, light-headedness, numbness and headaches.  Psychiatric/Behavioral: Positive for sleep disturbance. Negative for behavioral problems and decreased concentration. The patient is not nervous/anxious.     History Past Medical History  Diagnosis Date  . Hypertension   . Hypoglycemia   . Hyperlipemia   . Leg pain   . Chicken pox   . Depression   . Heart murmur   . Hypopituitarism   . PTSD (post-traumatic stress disorder)   . Family history of adverse reaction to anesthesia     mother- nausea and vomiting   . Complication of anesthesia     oxygen level  and blood pressure dropped wtih anterior cervical fusion surgery   . Sleep apnea     no cpap   . Hypothyroidism   . GERD (gastroesophageal reflux disease)     She has past surgical history that includes Eye surgery (40,98,11); Cholecystectomy (97); Cesarean section (91); Breast reduction surgery (96); Anterior cervical discectomy (2013); ganglion cyst removed; Laparoscopic  gastric sleeve resection (N/A, 05/28/2014); and Upper gi endoscopy (N/A, 05/28/2014).   Her family history includes Alcoholism in an other family member; Anuerysm in an other family member; Breast cancer in her maternal grandmother; Cancer in her father; Cancer (age of onset: 35) in her mother; Depression in her father; Hypertension in her father and another family member; Lung cancer in her maternal grandmother; Melanoma in her father; Osteoarthritis in her mother; Ovarian cancer in her maternal grandmother; Prostate cancer in her paternal grandfather; Stroke in an other family member.She reports that she has never smoked. She has never used smokeless tobacco. She reports that she does not drink alcohol or use illicit drugs.  Current Outpatient Prescriptions on File Prior to Visit  Medication Sig Dispense Refill  . atorvastatin (LIPITOR) 10 MG tablet Take 1 tablet (10 mg total) by mouth daily. Repeat labs are due now 30 tablet 5  . Cholecalciferol (VITAMIN D-3) 1000 UNITS CAPS Take 1 capsule by mouth daily.     . cyanocobalamin (,VITAMIN B-12,) 1000 MCG/ML injection Inject 1 mL (1,000 mcg total) into the muscle every 30 (thirty) days. 1 mL 0  . lamoTRIgine (LAMICTAL) 150 MG tablet Take 150 mg by mouth every morning.    Marland Kitchen levothyroxine (SYNTHROID, LEVOTHROID) 75 MCG tablet TAKE 1 TABLET BY MOUTH DAILY BEFORE BREAKFAST. 30 tablet 3  . lisinopril (PRINIVIL,ZESTRIL) 5 MG tablet TAKE 1 TABLET BY MOUTH ONCE DAILY 30 tablet 5  . Multiple Vitamin (MULTIVITAMIN) tablet Take 1 tablet by mouth daily.    . polyethylene glycol (MIRALAX / GLYCOLAX)  packet Take 17 g by mouth daily. As needed    . pramipexole (MIRAPEX) 0.5 MG tablet Take 1 tablet (0.5 mg total) by mouth at bedtime. 30 tablet 5  . QUEtiapine (SEROQUEL XR) 400 MG 24 hr tablet Take 400 mg by mouth at bedtime.    . ranitidine (ZANTAC) 150 MG tablet Take 1 tablet (150 mg total) by mouth 2 (two) times daily. (Patient taking differently: Take 150 mg by  mouth 2 (two) times daily. As needed) 60 tablet 0  . sertraline (ZOLOFT) 100 MG tablet Take 200 mg by mouth every morning.      No current facility-administered medications on file prior to visit.     Objective:  Objective Physical Exam  Constitutional: She is oriented to person, place, and time. She appears well-developed and well-nourished.  HENT:  Head: Normocephalic and atraumatic.  Eyes: Conjunctivae and EOM are normal.  Neck: Normal range of motion. Neck supple. No JVD present. Carotid bruit is not present. No thyromegaly present.  Cardiovascular: Normal rate, regular rhythm and normal heart sounds.   No murmur heard. Pulmonary/Chest: Effort normal and breath sounds normal. No respiratory distress. She has no wheezes. She has no rales. She exhibits no tenderness.  Musculoskeletal: She exhibits no edema.  Neurological: She is alert and oriented to person, place, and time.  Psychiatric: She has a normal mood and affect. Her behavior is normal. Judgment and thought content normal.   BP 110/68 mmHg  Pulse 74  Temp(Src) 98.7 F (37.1 C) (Oral)  Ht 4\' 9"  (1.448 m)  Wt 139 lb 3.2 oz (63.141 kg)  BMI 30.11 kg/m2  SpO2 98% Wt Readings from Last 3 Encounters:  11/19/14 139 lb 3.2 oz (63.141 kg)  09/19/14 145 lb 6.4 oz (65.953 kg)  09/05/14 152 lb (68.947 kg)     Lab Results  Component Value Date   WBC 4.7 09/19/2014   HGB 12.7 09/19/2014   HCT 37.1 09/19/2014   PLT 214.0 09/19/2014   GLUCOSE 61* 09/19/2014   CHOL 195 09/19/2014   TRIG 99.0 09/19/2014   HDL 76.60 09/19/2014   LDLCALC 99 09/19/2014   ALT 18 09/19/2014   AST 26 09/19/2014   NA 139 09/19/2014   K 4.0 09/19/2014   CL 101 09/19/2014   CREATININE 1.03 09/19/2014   BUN 15 09/19/2014   CO2 29 09/19/2014   TSH 0.45 09/19/2014   HGBA1C 5.4 09/19/2014   MICROALBUR <0.7 09/19/2014    No results found.   Assessment & Plan:  Plan I have discontinued Janet Turner's ciprofloxacin. I am also having her  maintain her sertraline, QUEtiapine, multivitamin, Vitamin D-3, ranitidine, polyethylene glycol, lamoTRIgine, cyanocobalamin, lisinopril, levothyroxine, atorvastatin, and pramipexole. We administered cyanocobalamin.  Meds ordered this encounter  Medications  . cyanocobalamin ((VITAMIN B-12)) injection 1,000 mcg    Sig:     Problem List Items Addressed This Visit    Restless leg    mirapex sent to pharmacy      B12 deficiency    b12 injection given today      Relevant Medications   cyanocobalamin ((VITAMIN B-12)) injection 1,000 mcg (Completed)    Other Visit Diagnoses    Hypersomnia with sleep apnea    -  Primary    Relevant Orders    Ambulatory referral to Pulmonology       Follow-up: Return if symptoms worsen or fail to improve.  Loreen FreudYvonne Lowne, DO

## 2014-11-19 NOTE — Discharge Instructions (Signed)
You have an infection of your ear. Take doxycycline twice a day for 10 days. Use the Norco every 4-6 hours as needed for severe pain. Do not drive while taking this medicine. Keep the area dry for the next 24 hours. After that you can shower normally. Let us know if it's not getting better.

## 2014-11-19 NOTE — Progress Notes (Signed)
Pre visit review using our clinic review tool, if applicable. No additional management support is needed unless otherwise documented below in the visit note. 

## 2014-11-19 NOTE — ED Notes (Signed)
C/o left ear pain Has recently had new piercing in ear No drainage

## 2014-11-20 ENCOUNTER — Ambulatory Visit: Payer: Self-pay

## 2014-11-21 ENCOUNTER — Ambulatory Visit: Payer: Self-pay | Admitting: Dietician

## 2014-11-25 ENCOUNTER — Other Ambulatory Visit: Payer: Self-pay | Admitting: Family Medicine

## 2014-11-25 DIAGNOSIS — Z0000 Encounter for general adult medical examination without abnormal findings: Secondary | ICD-10-CM

## 2014-11-29 ENCOUNTER — Encounter: Payer: Self-pay | Admitting: Dietician

## 2014-11-29 ENCOUNTER — Encounter: Payer: Self-pay | Admitting: Obstetrics and Gynecology

## 2014-11-29 ENCOUNTER — Encounter: Payer: 59 | Attending: Surgery | Admitting: Dietician

## 2014-11-29 DIAGNOSIS — Z713 Dietary counseling and surveillance: Secondary | ICD-10-CM | POA: Insufficient documentation

## 2014-11-29 DIAGNOSIS — Z6829 Body mass index (BMI) 29.0-29.9, adult: Secondary | ICD-10-CM | POA: Insufficient documentation

## 2014-11-29 NOTE — Patient Instructions (Addendum)
Goals:  Follow Phase 3B: High Protein + Non-Starchy Vegetables  Eat 3-6 small meals/snacks, every 3-5 hrs  Increase lean protein foods to meet 60g goal  Try to have a protein food with each meal and snack: deli meat, cheese, pre cooked sausage  Sip a protein shake (milk and Unjury) throughout the day  Increase fluid intake to 64oz +  Avoid drinking 15 minutes before, during and 30 minutes after eating  Aim for >30 min of physical activity daily   TANITA  BODY COMP RESULTS  05/20/14 06/11/14 07/25/14 09/05/14 11/29/14   BMI (kg/m^2) 41.5 38.8 35.7 32.9 29.1   Fat Mass (lbs) 92.5 84.0 68.5 57 48.5   Fat Free Mass (lbs) 99.5 95.5 96.5 95 86   Total Body Water (lbs) 73 70.0 70.5 69.5 63

## 2014-11-29 NOTE — Progress Notes (Signed)
  Follow-up visit: 6 months Post-Operative Gastric sleeve Surgery  Medical Nutrition Therapy:  Appt start time: 840 end time:  930  Primary concerns today: Post-operative Bariatric Surgery Nutrition Management.  Janet Turner returns having lost another 17.5 lbs in the last 3 months. Weighs herself every morning. She reports that she has really learned the boundaries of her pouch. She is not meeting her protein needs.   Non scale goals: tucking shirt in, wearing button up shirts, size 6  Samples provided and patient instructed on proper use: Unjury protein powder (chocolate - qty 2) Lot#: [id number] Exp: 04/2015  Unjury protein powder (unflavored - qty 2) Lot#: [id number] Exp: 11/2015  Surgery date: 05/28/14 Surgery type: gastric sleeve Start weight at [facility]: 191.5 lbs on 04/13/14 Weight today: 134.5 lbs Weight change: 17.5 lbs Total weight lost: 57 lbs Weight loss goal: 110-120 lbs (size 6)  TANITA  BODY COMP RESULTS  05/20/14 06/11/14 07/25/14 09/05/14 11/29/14   BMI (kg/m^2) 41.5 38.8 35.7 32.9 29.1   Fat Mass (lbs) 92.5 84.0 68.5 57 48.5   Fat Free Mass (lbs) 99.5 95.5 96.5 95 86   Total Body Water (lbs) 73 70.0 70.5 69.5 63    Preferred Learning Style:   No preference indicated   Learning Readiness:   Ready  24-hr recall: B (AM): cereal with milk or toast, jam, and milk or egg, bacon, and sausage Snk (AM): fruit or a cheese stick L (PM): 2 oz grilled chicken or broiled fish with vegetable or beans (14g) Snk (PM): fruit or a cheese stick D (4:30PM): see lunch (14g) Snk (PM): sometimes milk  Fluid intake: 12 oz 2% milk, 2 oz orange juice, mostly water (48 oz) Estimated total protein intake: unable to determine   Medications: see list Supplementation: taking  Using straws: no Drinking while eating: no Hair loss: yes Carbonated beverages: none N/V/D/C: regurgitation if she overeats; a little bit of constipation, uses Miralax Dumping syndrome: none   Recent physical activity:   walking  Progress Towards Goal(s):  In progress.    Nutritional Diagnosis:  [place]-3.3 Overweight/obesity related to past poor dietary habits and physical inactivity as evidenced by patient w/ recent gastric sleeve surgery following dietary guidelines for continued weight loss.     Intervention:  Nutrition counseling provided.  Teaching Method Utilized:  Visual Auditory  Barriers to learning/adherence to lifestyle change: none  Demonstrated degree of understanding via:  Teach Back   Monitoring/Evaluation:  Dietary intake, exercise, and body weight. Follow up in 3 months for 9 month post-op visit.

## 2014-12-06 ENCOUNTER — Encounter: Payer: Self-pay | Admitting: Gastroenterology

## 2014-12-20 ENCOUNTER — Ambulatory Visit (INDEPENDENT_AMBULATORY_CARE_PROVIDER_SITE_OTHER): Payer: 59

## 2014-12-20 DIAGNOSIS — E538 Deficiency of other specified B group vitamins: Secondary | ICD-10-CM

## 2014-12-20 MED ORDER — CYANOCOBALAMIN 1000 MCG/ML IJ SOLN
1000.0000 ug | Freq: Once | INTRAMUSCULAR | Status: AC
  Administered 2014-12-20: 1000 ug via INTRAMUSCULAR

## 2014-12-20 NOTE — Progress Notes (Signed)
Pre visit review using our clinic review tool, if applicable. No additional management support is needed unless otherwise documented below in the visit note.  Patient tolerated injection well.  Next injection scheduled for 01/17/15 at 10:45 AM.

## 2014-12-23 ENCOUNTER — Encounter: Payer: Self-pay | Admitting: Family Medicine

## 2014-12-30 ENCOUNTER — Telehealth: Payer: Self-pay | Admitting: Family Medicine

## 2014-12-30 NOTE — Telephone Encounter (Signed)
°  Relation to UJ:WJXB Call back number:[phone] Pharmacy:[facility]  Reason for call: pt states dr. Laury Axon informed her to let her know if she needed to increase the dosage on rx  pramipexole (MIRAPEX) 0.5 MG tablet      Pt states she has taken been double the .5 for about 1 wk and it helps a lot so she wants a new rx for 

## 2014-12-30 NOTE — Telephone Encounter (Signed)
Please advise it is ok to change and send.     KP

## 2014-12-30 NOTE — Telephone Encounter (Signed)
Ok to inc to 1 mg daily  #90 3 refills

## 2015-01-02 NOTE — Telephone Encounter (Signed)
Someone had already advised the patient to pick RX up

## 2015-01-06 ENCOUNTER — Other Ambulatory Visit: Payer: Self-pay | Admitting: Family Medicine

## 2015-01-07 ENCOUNTER — Other Ambulatory Visit: Payer: Self-pay | Admitting: Family Medicine

## 2015-01-08 ENCOUNTER — Telehealth: Payer: Self-pay | Admitting: Family Medicine

## 2015-01-08 NOTE — Telephone Encounter (Signed)
Lelon Perla, DO at 12/30/2014 2:22 PM     Status: Signed       Expand All Collapse All   Ok to inc to 1 mg daily #90 3 refills       Medication called into the pharmacy. Called and left a detailed voice message making patient aware that Rx had been called in.

## 2015-01-08 NOTE — Telephone Encounter (Signed)
Caller name: Lexxi Relationship to patient:SELF Can be reached:[phone] Pharmacy:[facility]   Reason for call: mIRAPEX  DR LOWNE AGREED TO UP THIS MED TO 1 MG.  PLEASE SEND THIS RX TO [facility]  THE PATIENT WAS TOLD THIS HAD BEEN DONE BUT THE PHARMACY DOES NOT HAVE THE  SCRIPT

## 2015-01-10 ENCOUNTER — Institutional Professional Consult (permissible substitution): Payer: Self-pay | Admitting: Pulmonary Disease

## 2015-01-15 ENCOUNTER — Encounter: Payer: Self-pay | Admitting: Obstetrics and Gynecology

## 2015-01-15 ENCOUNTER — Ambulatory Visit (INDEPENDENT_AMBULATORY_CARE_PROVIDER_SITE_OTHER): Payer: 59 | Admitting: Obstetrics and Gynecology

## 2015-01-15 VITALS — BP 112/60 | HR 64 | Resp 14 | Ht <= 58 in | Wt 130.0 lb

## 2015-01-15 DIAGNOSIS — Z0000 Encounter for general adult medical examination without abnormal findings: Secondary | ICD-10-CM

## 2015-01-15 DIAGNOSIS — Z01419 Encounter for gynecological examination (general) (routine) without abnormal findings: Secondary | ICD-10-CM | POA: Diagnosis not present

## 2015-01-15 DIAGNOSIS — R37 Sexual dysfunction, unspecified: Secondary | ICD-10-CM | POA: Diagnosis not present

## 2015-01-15 DIAGNOSIS — Z124 Encounter for screening for malignant neoplasm of cervix: Secondary | ICD-10-CM

## 2015-01-15 DIAGNOSIS — F5231 Female orgasmic disorder: Secondary | ICD-10-CM

## 2015-01-15 DIAGNOSIS — IMO0002 Reserved for concepts with insufficient information to code with codable children: Secondary | ICD-10-CM

## 2015-01-15 LAB — POCT URINALYSIS DIPSTICK
Bilirubin, UA: NEGATIVE
Blood, UA: NEGATIVE
Glucose, UA: NEGATIVE
Ketones, UA: NEGATIVE
LEUKOCYTES UA: NEGATIVE
Nitrite, UA: NEGATIVE
PH UA: 6.5
PROTEIN UA: NEGATIVE
Urobilinogen, UA: NEGATIVE

## 2015-01-15 NOTE — Patient Instructions (Signed)
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
## 2015-01-16 LAB — TESTOSTERONE, FREE, TOTAL, SHBG
SEX HORMONE BINDING: 62 nmol/L (ref 17–124)
TESTOSTERONE FREE: 2.7 pg/mL (ref 0.6–6.8)
Testosterone-% Free: 1.2 % (ref 0.4–2.4)
Testosterone: 23 ng/dL (ref 10–70)

## 2015-01-17 ENCOUNTER — Ambulatory Visit: Payer: 59

## 2015-01-17 LAB — IPS PAP TEST WITH HPV

## 2015-01-21 ENCOUNTER — Ambulatory Visit (INDEPENDENT_AMBULATORY_CARE_PROVIDER_SITE_OTHER): Payer: 59

## 2015-01-21 DIAGNOSIS — E538 Deficiency of other specified B group vitamins: Secondary | ICD-10-CM | POA: Diagnosis not present

## 2015-01-21 MED ORDER — CYANOCOBALAMIN 1000 MCG/ML IJ SOLN
1000.0000 ug | Freq: Once | INTRAMUSCULAR | Status: AC
  Administered 2015-01-21: 1000 ug via INTRAMUSCULAR

## 2015-01-21 NOTE — Progress Notes (Signed)
Pre visit review using our clinic review tool, if applicable. No additional management support is needed unless otherwise documented below in the visit note.  Pt tolerated injection well.    Next appt: 03/07/15 @ 4:00 pm.

## 2015-01-27 ENCOUNTER — Institutional Professional Consult (permissible substitution): Payer: Self-pay | Admitting: Pulmonary Disease

## 2015-02-03 ENCOUNTER — Ambulatory Visit: Payer: Self-pay | Admitting: Gastroenterology

## 2015-02-20 ENCOUNTER — Encounter: Payer: 59 | Attending: Surgery | Admitting: Dietician

## 2015-02-20 ENCOUNTER — Encounter: Payer: Self-pay | Admitting: Dietician

## 2015-02-20 ENCOUNTER — Ambulatory Visit: Payer: Self-pay

## 2015-02-20 DIAGNOSIS — Z713 Dietary counseling and surveillance: Secondary | ICD-10-CM | POA: Insufficient documentation

## 2015-02-20 DIAGNOSIS — Z6829 Body mass index (BMI) 29.0-29.9, adult: Secondary | ICD-10-CM | POA: Insufficient documentation

## 2015-02-20 NOTE — Patient Instructions (Signed)
  TANITA  BODY COMP RESULTS  05/20/14 06/11/14 07/25/14 09/05/14 11/29/14 02/20/15   BMI (kg/m^2) 41.5 38.8 35.7 32.9 29.1 28.2   Fat Mass (lbs) 92.5 84.0 68.5 57 48.5 45   Fat Free Mass (lbs) 99.5 95.5 96.5 95 86 85.5   Total Body Water (lbs) 73 70.0 70.5 69.5 63 62.5

## 2015-02-20 NOTE — Progress Notes (Signed)
  Follow-up visit: 9 months Post-Operative Gastric sleeve Surgery  Medical Nutrition Therapy:  Appt start time: 1200 end time:  1230  Primary concerns today: Post-operative Bariatric Surgery Nutrition Management.  Janet MaxwellCheryl returns having lost another 4 lbs in the last 3 months. Still weighs herself every morning. She got a new job and is much happier and has a more normal schedule. She also is now in a healthy relationship. Self esteem and confidence have greatly improved and she feels like she is no longer struggling with depression. [place] in January.  Non scale goals: tucking shirt in, wearing button up shirts, size 6   Surgery date: 05/28/14 Surgery type: gastric sleeve [facility]5 lbs on 04/13/14 Weight today: 130.5 lbs Weight change: 4 lbs Total weight lost: 61 lbs Weight loss goal: 110-120 lbs (size 6)  TANITA  BODY COMP RESULTS  05/20/14 06/11/14 07/25/14 09/05/14 11/29/14 02/20/15   BMI (kg/m^2) 41.5 38.8 35.7 32.9 29.1 28.2   Fat Mass (lbs) 92.5 84.0 68.5 57 48.5 45   Fat Free Mass (lbs) 99.5 95.5 96.5 95 86 85.5   Total Body Water (lbs) 73 70.0 70.5 69.5 63 62.5    Preferred Learning Style:   No preference indicated   Learning Readiness:   Ready  24-hr recall: B (AM): boiled egg OR 1/2 bagel with cream cheese; eggs and bacon on weekends (2-7g) Snk (AM): sometimes nuts (5g) L (PM): yogurt and a cheese stick OR P3 packs (13-17g) Snk (PM): sometimes nuts (5g) D (6PM): 3+ oz pork chops or steak or chicken with vegetables (21g) Snk (PM): sometimes milk  Fluid intake: 12 oz 2% milk, 2 oz orange juice, mostly water (48 oz) Estimated total protein intake: improving   Medications: see list Supplementation: taking  Using straws: no Drinking while eating: no Hair loss: no Carbonated beverages: none N/V/D/C: regurgitation if she overeats Dumping syndrome: none  Recent physical activity:  Walking; fitness machines  inconsistently  Progress Towards Goal(s):  In progress.    Nutritional Diagnosis:  [place]-3.3 Overweight/obesity related to past poor dietary habits and physical inactivity as evidenced by patient w/ recent gastric sleeve surgery following dietary guidelines for continued weight loss.     Intervention:  Nutrition counseling provided.  Teaching Method Utilized:  Visual Auditory  Barriers to learning/adherence to lifestyle change: none  Demonstrated degree of understanding via:  Teach Back   Monitoring/Evaluation:  Dietary intake, exercise, and body weight. Follow up in 4 months for 13 month post-op visit.

## 2015-02-21 ENCOUNTER — Ambulatory Visit: Payer: Self-pay | Admitting: Dietician

## 2015-02-25 ENCOUNTER — Ambulatory Visit (HOSPITAL_BASED_OUTPATIENT_CLINIC_OR_DEPARTMENT_OTHER)
Admission: RE | Admit: 2015-02-25 | Discharge: 2015-02-25 | Disposition: A | Discharge status: Home / Self Care | Payer: 59 | Source: Ambulatory Visit | Attending: Family Medicine | Admitting: Family Medicine

## 2015-02-25 ENCOUNTER — Encounter: Payer: Self-pay | Admitting: Family Medicine

## 2015-02-25 ENCOUNTER — Ambulatory Visit (INDEPENDENT_AMBULATORY_CARE_PROVIDER_SITE_OTHER): Payer: 59 | Admitting: Family Medicine

## 2015-02-25 VITALS — BP 121/76 | HR 63 | Temp 98.5°F | Wt 131.6 lb

## 2015-02-25 DIAGNOSIS — R29898 Other symptoms and signs involving the musculoskeletal system: Secondary | ICD-10-CM

## 2015-02-25 DIAGNOSIS — R202 Paresthesia of skin: Secondary | ICD-10-CM | POA: Diagnosis present

## 2015-02-25 DIAGNOSIS — E538 Deficiency of other specified B group vitamins: Secondary | ICD-10-CM | POA: Diagnosis not present

## 2015-02-25 MED ORDER — CYANOCOBALAMIN 1000 MCG/ML IJ SOLN
1000.0000 ug | Freq: Once | INTRAMUSCULAR | Status: AC
  Administered 2015-02-25: 1000 ug via INTRAMUSCULAR

## 2015-02-25 NOTE — Progress Notes (Signed)
Patient ID: Janet Turner, female    DOB: [birth year]  Age: 49 y.o. MRN: [id number]    Subjective:  Subjective HPI Janet Turner presents for c/o numbness, tingling and weakness in arm/ hand.  She noticed it in shower this am - she could not squeeze the shower gel bottle.  She c/o heaviness/ hurt in R arm that progressively worsened over today.  This is the same way she felt when she had surgery on her neck on L side 3 years ago.    Review of Systems  Constitutional: Negative for diaphoresis, appetite change, fatigue and unexpected weight change.  Eyes: Negative for pain, redness and visual disturbance.  Respiratory: Negative for cough, chest tightness, shortness of breath and wheezing.   Cardiovascular: Negative for chest pain, palpitations and leg swelling.  Endocrine: Negative for cold intolerance, heat intolerance, polydipsia, polyphagia and polyuria.  Genitourinary: Negative for dysuria, frequency and difficulty urinating.  Neurological: Positive for weakness. Negative for dizziness, light-headedness, numbness and headaches.    History Past Medical History  Diagnosis Date  . Hypertension   . Hypoglycemia   . Hyperlipemia   . Leg pain   . Chicken pox   . Depression   . Heart murmur   . Hypopituitarism (HCC)   . PTSD (post-traumatic stress disorder)   . Family history of adverse reaction to anesthesia     mother- nausea and vomiting   . Complication of anesthesia     oxygen level  and blood pressure dropped wtih anterior cervical fusion surgery   . Sleep apnea     no cpap   . Hypothyroidism   . GERD (gastroesophageal reflux disease)   . Amenorrhea   . Infertility, female     She has past surgical history that includes Eye surgery [phone]); Cholecystectomy (97); Cesarean section (91); Breast reduction surgery (96); Anterior cervical discectomy (2013); ganglion cyst removed; Laparoscopic gastric sleeve resection (N/A, 05/28/2014); and Upper gi endoscopy (N/A, 05/28/2014).     Her family history includes Alcoholism in an other family member; Anuerysm in an other family member; Breast cancer in her maternal grandmother; Cancer in her father; Cancer (age of onset: 48) in her mother; Depression in her father; Hypertension in her father and another family member; Lung cancer in her maternal grandmother; Melanoma in her father; Osteoarthritis in her mother; Ovarian cancer in her maternal grandmother; Prostate cancer in her paternal grandfather; Stroke in an other family member.She reports that she has never smoked. She has never used smokeless tobacco. She reports that she does not drink alcohol or use illicit drugs.  Current Outpatient Prescriptions on File Prior to Visit  Medication Sig Dispense Refill  . atorvastatin (LIPITOR) 10 MG tablet Take 1 tablet (10 mg total) by mouth daily. Repeat labs are due now 30 tablet 5  . Cholecalciferol (VITAMIN D-3) 1000 UNITS CAPS Take 1 capsule by mouth daily.     . cyanocobalamin (,VITAMIN B-12,) 1000 MCG/ML injection Inject 1 mL (1,000 mcg total) into the muscle every 30 (thirty) days. 1 mL 0  . lamoTRIgine (LAMICTAL) 150 MG tablet Take 150 mg by mouth every morning.    Marland Kitchen levothyroxine (SYNTHROID, LEVOTHROID) 75 MCG tablet TAKE 1 TABLET BY MOUTH DAILY BEFORE BREAKFAST. 30 tablet 8  . lisinopril (PRINIVIL,ZESTRIL) 5 MG tablet TAKE 1 TABLET BY MOUTH ONCE DAILY 30 tablet 5  . Multiple Vitamin (MULTIVITAMIN) tablet Take 1 tablet by mouth daily.    . sertraline (ZOLOFT) 100 MG tablet Take 200 mg by mouth every  morning.      No current facility-administered medications on file prior to visit.     Objective:  Objective Physical Exam  Constitutional: She is oriented to person, place, and time.  Musculoskeletal: Normal range of motion. She exhibits no edema or tenderness.  Neurological: She is alert and oriented to person, place, and time. She has normal reflexes. No cranial nerve deficit.  numness and tingling in R arm with weakness  in shoulder on right,  Weak grip and ext of forearm compared to left  Nursing note and vitals reviewed.  BP 121/76 mmHg  Pulse 63  Temp(Src) 98.5 F (36.9 C) (Oral)  Wt 131 lb 9.6 oz (59.693 kg)  SpO2 98% Wt Readings from Last 3 Encounters:  02/25/15 131 lb 9.6 oz (59.693 kg)  02/20/15 130 lb 8 oz (59.194 kg)  01/15/15 130 lb (58.968 kg)     Lab Results  Component Value Date   WBC 4.7 09/19/2014   HGB 12.7 09/19/2014   HCT 37.1 09/19/2014   PLT 214.0 09/19/2014   GLUCOSE 61* 09/19/2014   CHOL 195 09/19/2014   TRIG 99.0 09/19/2014   HDL 76.60 09/19/2014   LDLCALC 99 09/19/2014   ALT 18 09/19/2014   AST 26 09/19/2014   NA 139 09/19/2014   K 4.0 09/19/2014   CL 101 09/19/2014   CREATININE 1.03 09/19/2014   BUN 15 09/19/2014   CO2 29 09/19/2014   TSH 0.45 09/19/2014   HGBA1C 5.4 09/19/2014   MICROALBUR <0.7 09/19/2014    Mm Screening Breast Tomo Bilateral  11/21/2014  CLINICAL DATA:  Screening. EXAM: DIGITAL SCREENING BILATERAL MAMMOGRAM WITH 3D TOMO WITH CAD COMPARISON:  Previous exam(s). ACR Breast Density Category a: The breast tissue is almost entirely fatty. FINDINGS: There are no findings suspicious for malignancy. Images were processed with CAD. IMPRESSION: No mammographic evidence of malignancy. A result letter of this screening mammogram will be mailed directly to the patient. RECOMMENDATION: Screening mammogram in one year. (Code:SM-B-01Y) BI-RADS CATEGORY  1: Negative. Electronically Signed   By: Annia Beltrew  Turner M.D.   On: 11/21/2014 12:43     Assessment & Plan:  Plan I am having [name] maintain her sertraline, multivitamin, Vitamin D-3, lamoTRIgine, cyanocobalamin, lisinopril, atorvastatin, levothyroxine, pramipexole, and QUEtiapine. We administered cyanocobalamin.  Meds ordered this encounter  Medications  . pramipexole (MIRAPEX) 1 MG tablet    Sig: Take 1 tablet by mouth at bedtime.    Refill:  3  . QUEtiapine (SEROQUEL) 200 MG tablet    Sig: Take 2  tablets by mouth 2 (two) times daily.    Refill:  2  . cyanocobalamin ((VITAMIN B-12)) injection 1,000 mcg    Sig:     Problem List Items Addressed This Visit    Right arm weakness    Suspect cervical disc problem Check xray tomorrow Call or rto if any changes Pt states she has no pain right now just pressure and numbness and tingling with weakness in r arm.      B12 deficiency    b12 given today      Relevant Medications   cyanocobalamin ((VITAMIN B-12)) injection 1,000 mcg (Completed) (Start on 02/25/2015  6:15 PM)    Other Visit Diagnoses    Weakness of right arm    -  Primary    Relevant Orders    DG Cervical Spine Complete       Follow-up: No Follow-up on file.  Loreen FreudYvonne Lowne, DO

## 2015-02-25 NOTE — Progress Notes (Signed)
Pre visit review using our clinic review tool, if applicable. No additional management support is needed unless otherwise documented below in the visit note. 

## 2015-02-25 NOTE — Assessment & Plan Note (Signed)
Suspect cervical disc problem Check xray tomorrow Call or rto if any changes Pt states she has no pain right now just pressure and numbness and tingling with weakness in r arm.

## 2015-02-25 NOTE — Assessment & Plan Note (Signed)
b12 given today

## 2015-02-27 ENCOUNTER — Telehealth: Payer: Self-pay | Admitting: Family Medicine

## 2015-02-27 DIAGNOSIS — Z981 Arthrodesis status: Secondary | ICD-10-CM

## 2015-02-27 DIAGNOSIS — R200 Anesthesia of skin: Secondary | ICD-10-CM

## 2015-02-27 DIAGNOSIS — R202 Paresthesia of skin: Secondary | ICD-10-CM

## 2015-02-27 NOTE — Telephone Encounter (Signed)
Arthritic changes only--- due to numbness and burning in arm  Mri C spine-- hx cervical fusion    Discussed with patient and she verbalized understanding. Order has been placed.      KP

## 2015-02-27 NOTE — Telephone Encounter (Signed)
Pt calling for results on Xray. She said she had a mychart msg and she is asking about needing MRI based on notes that were attached. Please call her at work [phone]. You will have to ask for her.

## 2015-03-07 ENCOUNTER — Ambulatory Visit: Payer: Self-pay

## 2015-03-10 ENCOUNTER — Other Ambulatory Visit: Payer: Self-pay | Admitting: Family Medicine

## 2015-03-21 ENCOUNTER — Encounter: Payer: Self-pay | Admitting: Family Medicine

## 2015-04-01 ENCOUNTER — Ambulatory Visit (INDEPENDENT_AMBULATORY_CARE_PROVIDER_SITE_OTHER): Payer: 59 | Admitting: Behavioral Health

## 2015-04-01 DIAGNOSIS — E538 Deficiency of other specified B group vitamins: Secondary | ICD-10-CM

## 2015-04-01 MED ORDER — CYANOCOBALAMIN 1000 MCG/ML IJ SOLN
1000.0000 ug | Freq: Once | INTRAMUSCULAR | Status: AC
  Administered 2015-04-01: 1000 ug via INTRAMUSCULAR

## 2015-04-01 NOTE — Progress Notes (Signed)
Pre visit review using our clinic review tool, if applicable. No additional management support is needed unless otherwise documented below in the visit note.  Patient tolerated injection well. Next injection scheduled for 04/30/15 at 4:00 PM.

## 2015-04-07 ENCOUNTER — Other Ambulatory Visit: Payer: Self-pay | Admitting: Family Medicine

## 2015-04-30 ENCOUNTER — Ambulatory Visit (INDEPENDENT_AMBULATORY_CARE_PROVIDER_SITE_OTHER): Payer: 59

## 2015-04-30 DIAGNOSIS — E538 Deficiency of other specified B group vitamins: Secondary | ICD-10-CM

## 2015-04-30 MED ORDER — CYANOCOBALAMIN 1000 MCG/ML IJ SOLN
1000.0000 ug | Freq: Once | INTRAMUSCULAR | Status: AC
  Administered 2015-04-30: 1000 ug via INTRAMUSCULAR

## 2015-04-30 NOTE — Progress Notes (Signed)
Pre visit review using our clinic review tool, if applicable. No additional management support is needed unless otherwise documented below in the visit note.  Patient in for B12 injection. Given IM Left deltoid. Patient tolerated well. 

## 2015-05-20 MED FILL — lamoTRIgine 150 MG TABS: 150 | 30 days supply | Qty: 30 | Fill #2

## 2015-05-20 MED FILL — LEVOTHYROXINE 75 MCG TABLET: 75 | 30 days supply | Qty: 30 | Fill #2

## 2015-05-23 DIAGNOSIS — R52 Pain, unspecified: Secondary | ICD-10-CM

## 2015-05-26 ENCOUNTER — Telehealth: Payer: Self-pay | Admitting: Family Medicine

## 2015-05-27 MED FILL — PRAMIPEXOLE 1 MG TABLET: 1 | 30 days supply | Qty: 30 | Fill #3

## 2015-05-27 MED FILL — QUETIAPINE ER 200 MG TABLET: 200 | 30 days supply | Qty: 60 | Fill #3

## 2015-05-28 ENCOUNTER — Ambulatory Visit: Payer: Self-pay

## 2015-06-02 NOTE — Telephone Encounter (Signed)
Per email Received flu shot in October, 2016 - did not list location received

## 2015-06-02 NOTE — Telephone Encounter (Signed)
Noted, she is a Producer, television/film/video.    KP

## 2015-06-03 MED FILL — ATORVASTATIN 10 MG TABLET: 10 | 30 days supply | Qty: 30 | Fill #1

## 2015-06-03 MED FILL — SERTRALINE HCL 100 MG TAB: 100 | 30 days supply | Qty: 60 | Fill #2

## 2015-06-06 ENCOUNTER — Ambulatory Visit: Payer: Self-pay

## 2015-06-09 MED FILL — LISINOPRIL 5 MG TABLET: 5 | 90 days supply | Qty: 90 | Fill #1

## 2015-06-13 ENCOUNTER — Encounter: Payer: Self-pay | Admitting: Family Medicine

## 2015-06-16 DIAGNOSIS — M722 Plantar fascial fibromatosis: Secondary | ICD-10-CM

## 2015-06-19 MED FILL — lamoTRIgine 150 MG TABS: 150 | 30 days supply | Qty: 30 | Fill #3

## 2015-06-19 MED FILL — LEVOTHYROXINE 75 MCG TABLET: 75 | 30 days supply | Qty: 30 | Fill #3

## 2015-06-23 ENCOUNTER — Ambulatory Visit: Payer: Self-pay | Admitting: Dietician

## 2015-06-24 ENCOUNTER — Ambulatory Visit: Payer: Self-pay

## 2015-06-26 MED FILL — QUETIAPINE ER 200 MG TABLET: 200 | 30 days supply | Qty: 60 | Fill #4

## 2015-06-26 MED FILL — PRAMIPEXOLE 1 MG TABLET: 1 | 90 days supply | Qty: 90 | Fill #4 | Status: TO

## 2015-07-01 ENCOUNTER — Ambulatory Visit: Payer: Self-pay

## 2015-07-07 MED FILL — ATORVASTATIN 10 MG TABLET: 10 | 30 days supply | Qty: 30 | Fill #2

## 2015-07-07 MED FILL — SERTRALINE HCL 100 MG TAB: 100 | 30 days supply | Qty: 60 | Fill #3

## 2015-07-21 MED FILL — QUETIAPINE ER 200 MG TABLET: 200 | 30 days supply | Qty: 60 | Fill #5

## 2015-07-21 MED FILL — LEVOTHYROXINE 75 MCG TABLET: 75 | 30 days supply | Qty: 30 | Fill #4

## 2015-07-21 MED FILL — lamoTRIgine 150 MG TABS: 150 | 30 days supply | Qty: 30 | Fill #4

## 2015-07-22 ENCOUNTER — Ambulatory Visit (INDEPENDENT_AMBULATORY_CARE_PROVIDER_SITE_OTHER): Payer: 59 | Admitting: Family Medicine

## 2015-07-22 ENCOUNTER — Encounter: Payer: Self-pay | Admitting: Family Medicine

## 2015-07-22 VITALS — BP 146/84 | HR 60 | Temp 98.4°F | Wt 137.8 lb

## 2015-07-22 DIAGNOSIS — M25561 Pain in right knee: Secondary | ICD-10-CM

## 2015-07-22 NOTE — Progress Notes (Signed)
Janet Turner is a 50 y.o. female   HPI:  Knee Pain: Patient presents with knee pain involving the  right knee. Onset of the symptoms was several days ago. Inciting event: none known. Current symptoms include pain located over patella. Pain is aggravated by any weight bearing, going up and down stairs, rising after sitting, squatting, standing and walking.  Patient has had no prior knee problems. Evaluation to date: none. Treatment to date: none.  Past Medical History  Diagnosis Date  . Hypertension   . Hypoglycemia   . Hyperlipemia   . Leg pain   . Chicken pox   . Depression   . Heart murmur   . Hypopituitarism (HCC)   . PTSD (post-traumatic stress disorder)   . Family history of adverse reaction to anesthesia     mother- nausea and vomiting   . Complication of anesthesia     oxygen level  and blood pressure dropped wtih anterior cervical fusion surgery   . Sleep apnea     no cpap   . Hypothyroidism   . GERD (gastroesophageal reflux disease)   . Amenorrhea   . Infertility, female    Past Surgical History  Procedure Laterality Date  . Eye surgery  [phone]    x's 3   . Cholecystectomy  97  . Cesarean section  91  . Breast reduction surgery  96  . Anterior cervical discectomy  2013    Ant Cervical Fusion C4-5 with diskectomy  . Ganglion cyst removed    . Laparoscopic gastric sleeve resection N/A 05/28/2014    Procedure: LAPAROSCOPIC GASTRIC SLEEVE RESECTION;  Surgeon: Valarie Merino, MD;  Location: [facility];  Service: General;  Laterality: N/A;  . Upper gi endoscopy N/A 05/28/2014    Procedure: UPPER GI ENDOSCOPY;  Surgeon: Valarie Merino, MD;  Location: [facility];  Service: General;  Laterality: N/A;    Current outpatient prescriptions:  .  atorvastatin (LIPITOR) 10 MG tablet, TAKE 1 TABLET BY MOUTH DAILY. REPEAT LABS ARE DUE NOW, Disp: 30 tablet, Rfl: 4 .  Cholecalciferol (VITAMIN D-3) 1000 UNITS CAPS, Take 1 capsule by mouth daily. , Disp: , Rfl:  .  cyanocobalamin  (,VITAMIN B-12,) 1000 MCG/ML injection, Inject 1 mL (1,000 mcg total) into the muscle every 30 (thirty) days., Disp: 1 mL, Rfl: 0 .  lamoTRIgine (LAMICTAL) 150 MG tablet, Take 150 mg by mouth every morning., Disp: , Rfl:  .  levothyroxine (SYNTHROID, LEVOTHROID) 75 MCG tablet, TAKE 1 TABLET BY MOUTH DAILY BEFORE BREAKFAST., Disp: 30 tablet, Rfl: 8 .  lisinopril (PRINIVIL,ZESTRIL) 5 MG tablet, TAKE 1 TABLET BY MOUTH ONCE DAILY, Disp: 30 tablet, Rfl: 5 .  Multiple Vitamin (MULTIVITAMIN) tablet, Take 1 tablet by mouth daily., Disp: , Rfl:  .  pramipexole (MIRAPEX) 1 MG tablet, Take 1 tablet by mouth at bedtime., Disp: , Rfl: 3 .  QUEtiapine (SEROQUEL) 200 MG tablet, Take 2 tablets by mouth 2 (two) times daily., Disp: , Rfl: 2 .  sertraline (ZOLOFT) 100 MG tablet, Take 200 mg by mouth every morning. , Disp: , Rfl:  Allergies  Allergen Reactions  . Penicillins Hives and Itching    reports that she has never smoked. She has never used smokeless tobacco. She reports that she does not drink alcohol or use illicit drugs. Family History  Problem Relation Age of Onset  . Osteoarthritis Mother   . Cancer Mother 63    breast--stage III  invasive ductal ca  . Hypertension Father   .  Melanoma Father   . Depression Father     PTSD  . Cancer Father     melanoma  . Alcoholism    . Ovarian cancer Maternal Grandmother   . Breast cancer Maternal Grandmother   . Lung cancer Maternal Grandmother   . Prostate cancer Paternal Grandfather   . Stroke      Paternal Family--9/12 children after age 360  . Anuerysm      Paternal family  . Hypertension      Paternal family    Knee: Normal to inspection with no erythema or effusion or obvious bony abnormalities. Palpation normal with no warmth or joint line tenderness or patellar tenderness or condyle tenderness. ROM normal in flexion and extension and lower leg rotation. Ligaments with solid consistent endpoints including ACL, PCL, LCL, MCL. No  crepitus Some tenderness over patellar tendon R knee

## 2015-07-22 NOTE — Progress Notes (Signed)
Pre visit review using our clinic review tool, if applicable. No additional management support is needed unless otherwise documented below in the visit note. 

## 2015-07-22 NOTE — Assessment & Plan Note (Signed)
Refer to sport med Check xray Tylenol or nsaid for pain for now Ice  elevate

## 2015-07-22 NOTE — Patient Instructions (Signed)

## 2015-07-23 ENCOUNTER — Ambulatory Visit (HOSPITAL_BASED_OUTPATIENT_CLINIC_OR_DEPARTMENT_OTHER)
Admission: RE | Admit: 2015-07-23 | Discharge: 2015-07-23 | Disposition: A | Discharge status: Home / Self Care | Payer: 59 | Source: Ambulatory Visit | Attending: Family Medicine | Admitting: Family Medicine

## 2015-07-23 DIAGNOSIS — M25561 Pain in right knee: Secondary | ICD-10-CM | POA: Insufficient documentation

## 2015-07-28 ENCOUNTER — Other Ambulatory Visit: Payer: Self-pay

## 2015-07-28 DIAGNOSIS — M1711 Unilateral primary osteoarthritis, right knee: Secondary | ICD-10-CM

## 2015-07-29 ENCOUNTER — Ambulatory Visit: Payer: Self-pay | Admitting: Dietician

## 2015-07-31 ENCOUNTER — Ambulatory Visit: Payer: 59 | Admitting: Family Medicine

## 2015-08-05 ENCOUNTER — Other Ambulatory Visit: Payer: Self-pay | Admitting: Family Medicine

## 2015-08-05 ENCOUNTER — Encounter: Payer: Self-pay | Admitting: Family Medicine

## 2015-08-05 ENCOUNTER — Ambulatory Visit: Payer: 59 | Admitting: Family Medicine

## 2015-08-05 DIAGNOSIS — M25561 Pain in right knee: Secondary | ICD-10-CM

## 2015-08-05 MED ORDER — MELOXICAM 15 MG PO TABS: 15.0000 mg | ORAL_TABLET | Freq: Every day | ORAL | Status: DC

## 2015-08-05 MED FILL — MELOXICAM 15 MG TABLET: 15 | 30 days supply | Qty: 30 | Fill #0

## 2015-08-18 ENCOUNTER — Other Ambulatory Visit: Payer: Self-pay | Admitting: Family Medicine

## 2015-08-18 ENCOUNTER — Encounter: Payer: Self-pay | Admitting: Family Medicine

## 2015-08-18 DIAGNOSIS — G4733 Obstructive sleep apnea (adult) (pediatric): Secondary | ICD-10-CM

## 2015-08-19 ENCOUNTER — Ambulatory Visit: Payer: Self-pay | Admitting: Dietician

## 2015-08-19 ENCOUNTER — Encounter: Payer: Self-pay | Admitting: Family Medicine

## 2015-08-19 NOTE — Telephone Encounter (Signed)
We would need to check a b12 level first

## 2015-08-20 ENCOUNTER — Other Ambulatory Visit: Payer: Self-pay | Admitting: Family Medicine

## 2015-08-20 MED FILL — SERTRALINE HCL 100 MG TAB: 100 | 30 days supply | Qty: 60 | Fill #4

## 2015-08-20 MED FILL — ATORVASTATIN 10 MG TABLET: 10 | 30 days supply | Qty: 30 | Fill #3

## 2015-08-20 MED FILL — LEVOTHYROXINE 75 MCG TABLET: 75 | 30 days supply | Qty: 30 | Fill #0 | Status: TO

## 2015-08-20 MED FILL — lamoTRIgine 150 MG TABS: 150 | 30 days supply | Qty: 30 | Fill #5

## 2015-08-21 ENCOUNTER — Other Ambulatory Visit: Payer: Self-pay

## 2015-08-21 DIAGNOSIS — E538 Deficiency of other specified B group vitamins: Secondary | ICD-10-CM

## 2015-08-21 MED FILL — QUETIAPINE ER 200 MG TABLET: 200 | 30 days supply | Qty: 60 | Fill #0

## 2015-08-26 ENCOUNTER — Ambulatory Visit: Payer: Self-pay | Admitting: Dietician

## 2015-08-26 ENCOUNTER — Other Ambulatory Visit: Payer: 59

## 2015-09-02 ENCOUNTER — Ambulatory Visit: Payer: Self-pay | Admitting: Dietician

## 2015-09-03 ENCOUNTER — Ambulatory Visit (INDEPENDENT_AMBULATORY_CARE_PROVIDER_SITE_OTHER): Payer: 59 | Admitting: Neurology

## 2015-09-03 ENCOUNTER — Encounter: Payer: Self-pay | Admitting: Neurology

## 2015-09-03 ENCOUNTER — Other Ambulatory Visit (INDEPENDENT_AMBULATORY_CARE_PROVIDER_SITE_OTHER): Payer: 59

## 2015-09-03 VITALS — BP 108/68 | HR 76 | Resp 20 | Ht <= 58 in | Wt 135.0 lb

## 2015-09-03 DIAGNOSIS — G4710 Hypersomnia, unspecified: Secondary | ICD-10-CM

## 2015-09-03 DIAGNOSIS — R0683 Snoring: Secondary | ICD-10-CM | POA: Diagnosis not present

## 2015-09-03 DIAGNOSIS — E538 Deficiency of other specified B group vitamins: Secondary | ICD-10-CM

## 2015-09-03 DIAGNOSIS — G4730 Sleep apnea, unspecified: Secondary | ICD-10-CM

## 2015-09-03 DIAGNOSIS — G47411 Narcolepsy with cataplexy: Secondary | ICD-10-CM | POA: Diagnosis not present

## 2015-09-03 LAB — VITAMIN B12: VITAMIN B 12: 786 pg/mL (ref 211–911)

## 2015-09-03 NOTE — Patient Instructions (Signed)
Hypersomnia Hypersomnia is when you feel extremely tired during the day even though you're getting plenty of sleep at night. You may need to take naps during the day, and you may also be extremely difficult to wake up when you are sleeping.  CAUSES  The cause of your hypersomnia may not be known. Hypersomnia may be caused by:   Medicines.  Sleep disorders, such as narcolepsy.  Trauma or injury to your head or nervous system.  Using drugs or alcohol.  Tumors.  Medical conditions, such as depression or hypothyroidism.  Genetics. SIGNS AND SYMPTOMS  The main symptoms of hypersomnia include:   Feeling extremely tired throughout the day.  Being very difficult to wake up.  Sleeping for longer and longer periods.  Taking naps throughout the day. Other symptoms may include:   Feeling:  Restless.  Annoyed.  Anxious.  Low energy.  Having difficulty:  Remembering.  Speaking.  Thinking.  Losing your appetite.  Experiencing hallucinations. DIAGNOSIS  Hypersomnia may be diagnosed by:  Medical history and physical exam. This will include a sleep history.  Completing sleep logs.  Tests may also be done, such as:  Polysomnography.  Multiple sleep latency test (MSLT). TREATMENT  There is no cure for hypersomnia, but treatment can be very effective in helping manage the condition. Treatment may include:  Lifestyle and sleeping strategies to help cope with the condition.  Stimulant medicines.  Treating any underlying causes of hypersomnia. HOME CARE INSTRUCTIONS  Take medicines only as directed by your health care provider.  Schedule short naps for when you feel sleepiest during the day. Tell your employer or teachers that you have hypersomnia. You may be able to adjust your schedule to include time for naps.  Avoid drinking alcohol or caffeinated beverages.  Do not eat a heavy meal before bedtime. Eat at about the same times every day.  Do not drive or  operate heavy machinery if you are sleepy.  Do not swim or go out on the water without a life jacket.  If possible, adjust your schedule so that you do not have to work or be active at night.  Keep all follow-up visits as directed by your health care provider. This is important. SEEK MEDICAL CARE IF:   You have new symptoms.  Your symptoms get worse. SEEK IMMEDIATE MEDICAL CARE IF:  You have serious thoughts of hurting yourself or someone else.   This information is not intended to replace advice given to you by your health care provider. Make sure you discuss any questions you have with your health care provider.   Document Released: 04/16/2002 Document Revised: 05/17/2014 Document Reviewed: 11/29/2013 Elsevier Interactive Patient Education 2016 [employer] Inc[employer]  

## 2015-09-03 NOTE — Progress Notes (Signed)
SLEEP MEDICINE CLINIC   Provider:  Melvyn Novas, M D  Referring Provider: Zola Button, Grayling Congress, * Primary Care Physician:  Donato Schultz, DO  Chief Complaint  Patient presents with  . New Patient (Initial Visit)    sleep study in 2014, used cpap, don't have that cpap any longer, rm 11, alone    HPI:  Janet Turner is a 50 y.o. female , seen here as a referral  from Dr. Zola Button for a sleep apnea re -evaluation,   Janet Turner is seen here today as a new patient to our sleep clinic. She has a past history of hypertension, hypoglycemia, heart murmur, hypopituitarism, PTSD, hypothyroidism and a menorrhea. She also carries a diagnosis of gastroesophageal reflux disease. She had 3 times undergone eye surgery in 1968, 1970 1982. Her current chief complaint is that of excessive daytime sleepiness reflected in an Epworth score of 17 points. It turns out that she is snoring, but she has excessive daytime sleepiness but also restless legs, and that she was tested and diagnosed with sleep apnea on 02-12-2013 at [facility]. I "hear from her previous sleep study Epworth sleepiness score was endorsed at 17, insomnia severity index at 10, Becks depression scale at 7 body mass index was named at 39.5 in the meantime the patient is undergone a gastric sleeve surgery and her body mass index has significantly reduced. She was diagnosed with sleep apnea at a total AHI of 22.4 in supine, 52.0 in nonsupine and a total of 25.3. REM AHI was 12.6, total number of obstructive apneas were 25 total number of central apneas 2,  hypopneas  92. The same night she was titrated to CPAP beginning at 4 cm water and to a final pressure of 10 cm water. I would like to add that the patient never reached 0 apneas and that the AHI index under 10 cm water was still 14.7 indicated that she did not have a complete night titration non-of the pressures explored was adequate to control all  respiratory events. For this reason Dr. Luberta Robertson and Dr Ninfa Linden ordered an hour to titrated between 9 and 15 cm water. Janet Turner took this CPAP prescription with her and actually used CPAP a couple of nights 4 weeks but then lost interest. She had started to feel better and have made the decision to undergo a weight loss surgery. For this reason she returned CPAP based on being deemed noncompliant.  On 05/28/2014 she underwent a gastric sleeve procedure and since then has lost 75 pounds. While this habits many positive effects for her overall health she remained as sleepy as before. She is now wondering if she still has sleep apnea. Chief complaint according to patient :I am sleepy .   Sleep habits are as follows: The patient usually goes to bed at 9 PM -with the help of Seroquel  400 mg which helps her to sleep. Her husband reports her to snore and gasp, gurgle. Her husband is witnessing this. She wakes at 2 AM form the difficulties to breath yet is under the influence of the medication. She gets a snack, she watches TV at night, and sometimes falls asleep with food in her mouth. She is showing other risky behavior with micowaving food at night in a sleep walking state. She will try to read to help to get back to sleep, she doesn't comprehend , dozing off, dozing out. She feels hung over. During the week she has  to arise at 5:45 AM to get ready for work. She needs an alarm in the morning badly. She repeatedly uses the snooze button she is not refreshed or restored in the morning. Some mornings she will have a headache, she frequently has a dry, almost parched mouth.   She reports driving under the same medication effect. She knows that this is unsafe, and she is now willing to be retested and hopefully CPAP can control her sleepiness. She is besides the Seroquel and other medications that could affect her sleep including Synthroid, Lamictal which sometimes causes insomnia, Mirapex which can cause micro-  sleep attacks, and Zoloft.  Caucasian , 50 year old married female , who sleeps in the same room as her husband, cool, quiet and dark.  Sleeps on her side, but always ends in supine. Sleep talking starts 120 minutes after sleep . She does not drink any caffeine containing  beverages, no alcohol, she does not use tobacco products. 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  Sleep medical history and family sleep history: no history of OSA in family members.    Social history:  See above .  Review of Systems: Out of a complete 14 system review, the patient complains of only the following symptoms, and all other reviewed systems are negative. She has not experienced sleep paralysis, she sometimes seems to have a very short dream latency, possible hypoechoic hallucinations, she has experienced cataplectic attacks when angry or upset, her knees will buckle.  Epworth score 17 , Fatigue severity score 21  , depression score 2   Social History   Social History  . Marital Status: Significant Other    Spouse Name: N/A  . Number of Children: N/A  . Years of Education: N/A   Occupational History  . Not on file.   Social History Main Topics  . Smoking status: Never Smoker   . Smokeless tobacco: Never Used  . Alcohol Use: No  . Drug Use: No  . Sexual Activity:    Partners: Male   Other Topics Concern  . Not on file   Social History Narrative    Family History  Problem Relation Age of Onset  . Osteoarthritis Mother   . Cancer Mother 41    breast--stage III  invasive ductal ca  . Hypertension Father   . Melanoma Father   . Depression Father     PTSD  . Cancer Father     melanoma  . Alcoholism    . Ovarian cancer Maternal Grandmother   . Breast cancer Maternal Grandmother   . Lung cancer Maternal Grandmother   . Prostate cancer Paternal Grandfather   . Stroke      Paternal Family--9/12 children after age 18  . Anuerysm      Paternal family  . Hypertension      Paternal family    Past  Medical History  Diagnosis Date  . Hypertension   . Hypoglycemia   . Hyperlipemia   . Leg pain   . Chicken pox   . Depression   . Heart murmur   . Hypopituitarism (HCC)   . PTSD (post-traumatic stress disorder)   . Family history of adverse reaction to anesthesia     mother- nausea and vomiting   . Complication of anesthesia     oxygen level  and blood pressure dropped wtih anterior cervical fusion surgery   . Sleep apnea     no cpap   . Hypothyroidism   . GERD (gastroesophageal reflux  disease)   . Amenorrhea   . Infertility, female     Past Surgical History  Procedure Laterality Date  . Eye surgery  [phone],70,82    x's 3   . Cholecystectomy  97  . Cesarean section  91  . Breast reduction surgery  96  . Anterior cervical discectomy  2013    Ant Cervical Fusion C4-5 with diskectomy  . Ganglion cyst removed    . Laparoscopic gastric sleeve resection N/A 05/28/2014    Procedure: LAPAROSCOPIC GASTRIC SLEEVE RESECTION;  Surgeon: Valarie MerinoMatthew B Martin, MD;  Location: [facility];  Service: General;  Laterality: N/A;  . Upper gi endoscopy N/A 05/28/2014    Procedure: UPPER GI ENDOSCOPY;  Surgeon: Valarie MerinoMatthew B Martin, MD;  Location: [facility];  Service: General;  Laterality: N/A;    Current Outpatient Prescriptions  Medication Sig Dispense Refill  . atorvastatin (LIPITOR) 10 MG tablet TAKE 1 TABLET BY MOUTH DAILY. REPEAT LABS ARE DUE NOW 30 tablet 4  . Cholecalciferol (VITAMIN D-3) 1000 UNITS CAPS Take 1 capsule by mouth daily.     . cyanocobalamin (,VITAMIN B-12,) 1000 MCG/ML injection Inject 1 mL (1,000 mcg total) into the muscle every 30 (thirty) days. 1 mL 0  . lamoTRIgine (LAMICTAL) 150 MG tablet Take 150 mg by mouth every morning.    Marland Kitchen. levothyroxine (SYNTHROID, LEVOTHROID) 75 MCG tablet TAKE 1 TABLET BY MOUTH DAILY BEFORE BREAKFAST. 30 tablet 5  . lisinopril (PRINIVIL,ZESTRIL) 5 MG tablet TAKE 1 TABLET BY MOUTH ONCE DAILY 30 tablet 5  . meloxicam (MOBIC) 15 MG tablet Take 1 tablet (15 mg  total) by mouth daily. 30 tablet 0  . Multiple Vitamin (MULTIVITAMIN) tablet Take 1 tablet by mouth daily.    . pramipexole (MIRAPEX) 1 MG tablet Take 1 tablet by mouth at bedtime.  3  . QUEtiapine (SEROQUEL) 200 MG tablet Take 2 tablets by mouth 2 (two) times daily.  2  . sertraline (ZOLOFT) 100 MG tablet Take 200 mg by mouth every morning.      No current facility-administered medications for this visit.    Allergies as of 09/03/2015 - Review Complete 09/03/2015  Allergen Reaction Noted  . Penicillins Hives and Itching 09/03/2012    Vitals: BP 108/68 mmHg  Pulse 76  Resp 20  Ht 4\' 9"  (1.448 m)  Wt 135 lb (61.236 kg)  BMI 29.21 kg/m2 Last Weight:  Wt Readings from Last 1 Encounters:  09/03/15 135 lb (61.236 kg)   EAV:WUJWBMI:Body mass index is 29.21 kg/(m^2).     Last Height:   Ht Readings from Last 1 Encounters:  09/03/15 4\' 9"  (1.448 m)    Physical exam:  General: The patient is awake, alert and appears not in acute distress. The patient is well groomed. Head: Normocephalic, atraumatic. Neck is supple. Mallampati 3,  neck circumference:12 Nasal airflow free, TMJ is not evident . Retrognathia is not seen.  She wears retainers.  Cardiovascular:  Regular rate and rhythm, without  murmurs or carotid bruit, and without distended neck veins. Respiratory: Lungs are clear to auscultation. Skin:  Without evidence of edema, or rash Trunk: BMI is elevated . The patient's posture is erect.   Neurologic exam : The patient is awake and alert, oriented to place and time.   Memory subjective  described as intact.  Attention span & concentration ability appears normal.  Speech is fluent,  without dysarthria, dysphonia or aphasia.  Mood and affect are appropriate.  Cranial nerves: Pupils are equal and briskly reactive to light.  Funduscopic exam without evidence of pallor or edema.  Extraocular movements  in vertical and horizontal planes intact and without nystagmus. Visual fields by finger  perimetry are intact. Hearing to finger rub intact.   Facial sensation intact to fine touch.  Facial motor strength is symmetric and tongue and uvula move midline. Shoulder shrug was symmetrical.   Motor exam: Normal tone, muscle bulk and symmetric strength in all extremities.  Sensory:  Fine touch, pinprick and vibration - Proprioception tested in the upper extremities was normal.  Coordination: Rapid alternating movements and  Finger-to-nose maneuver  normal without evidence of ataxia, dysmetria or tremor.  Gait and station: Patient walks without assistive device and is able unassisted to climb up to the exam table. Strength within normal limits.  Stance is stable and normal.    Deep tendon reflexes: in the  upper and lower extremities are symmetric and intact. Babinski maneuver response is downgoing.  The patient was advised of the nature of the diagnosed sleep disorder , the treatment options and risks for general a health and wellness arising from not treating the condition.  I spent more than  minutes of face to face time with the patient.  Greater than 50% of time was spent in counseling and coordination of care. We have discussed the diagnosis and differential and I answered the patient's questions.     Assessment:  After physical and neurologic examination, review of laboratory studies,  Personal review of imaging studies, reports of other /same  Imaging studies ,  Results of polysomnography/ neurophysiology testing and pre-existing records as far as provided in visit., my assessment is   1) Janet Turner has a long-standing history of severe hypersomnia, which could be partially medication induced but she was also tested positive for obstructive sleep apnea at a moderate degree in 2014. At the time CPAP seemed to be less attractive to her and she chose a weight loss surgery, assuming that this would treat the apnea 2. I am not sure if she still has apnea but she definitely is still  a snorer and she has developed some parasomnia activities. I have reviewed her medication list and there are several medications that also could give her additional sleepiness. Especially warned her about Mirapex and Requip which can cause Micro sleep attacks.  2) I will order a split night polysomnography and parasomnia montage for the patient, I would like her to be an early arrival, I also would like her to bring her sleep medications with her to the lab but not take it prior. The sleep study will and usually between 5:30 and 6 AM to write in time for her to go to work if needed. I would like for her to reduce driving significantly as long as she has daytime sleep attacks. Given that she also endorsed muscle tone loss with emotional upset I will further evaluate her for narcolepsy and cataplexy. For this it is necessary to first rule out obstructive sleep apnea as the more common sleep disorder and to treated adequately if found.      Plan:  Treatment plan and additional workup :  SPLIT night, parasomnia, Co2.  Narcolepsy follow up- not to be able to a have a valid MSLT.   Porfirio Mylar Dystany Duffy MD  09/03/2015   CC: Donato Schultz, Do [address] [place], [place] [address]

## 2015-09-09 ENCOUNTER — Encounter: Payer: Self-pay | Admitting: Family Medicine

## 2015-09-15 NOTE — Telephone Encounter (Signed)
09/03/15 B12 level: 786 Pt is on nurse schedule for tomorrow for B12 injection. Okay to give or should she continue B12 PO?

## 2015-09-16 ENCOUNTER — Institutional Professional Consult (permissible substitution): Payer: 59 | Admitting: Neurology

## 2015-09-16 ENCOUNTER — Ambulatory Visit (INDEPENDENT_AMBULATORY_CARE_PROVIDER_SITE_OTHER): Payer: 59

## 2015-09-16 DIAGNOSIS — E538 Deficiency of other specified B group vitamins: Secondary | ICD-10-CM | POA: Diagnosis not present

## 2015-09-16 MED ORDER — CYANOCOBALAMIN 1000 MCG/ML IJ SOLN
1000.0000 ug | Freq: Once | INTRAMUSCULAR | Status: AC
  Administered 2015-09-16: 1000 ug via INTRAMUSCULAR

## 2015-09-16 NOTE — Progress Notes (Signed)
Pre visit review using our clinic tool,if applicable. No additional management support is needed unless otherwise documented below in the visit note.   Patient in for B 12 injection per order from Dr. Zola ButtonLowne-Chase. Patient has B 12 deficiency. Given Left deltoid Im. Patient tolerated well. Appointment scheduled for 1 month. Patient has no complaints today taking all medications as ordered.

## 2015-09-22 ENCOUNTER — Other Ambulatory Visit: Payer: Self-pay | Admitting: Family Medicine

## 2015-09-22 ENCOUNTER — Ambulatory Visit (INDEPENDENT_AMBULATORY_CARE_PROVIDER_SITE_OTHER): Payer: 59 | Admitting: Neurology

## 2015-09-22 DIAGNOSIS — G4730 Sleep apnea, unspecified: Secondary | ICD-10-CM | POA: Diagnosis not present

## 2015-09-22 DIAGNOSIS — G4710 Hypersomnia, unspecified: Secondary | ICD-10-CM

## 2015-09-22 DIAGNOSIS — G47411 Narcolepsy with cataplexy: Secondary | ICD-10-CM | POA: Diagnosis not present

## 2015-09-22 DIAGNOSIS — R0683 Snoring: Secondary | ICD-10-CM

## 2015-09-22 MED FILL — LISINOPRIL 5 MG TABLET: 5 | 30 days supply | Qty: 30 | Fill #0

## 2015-09-23 MED FILL — ATORVASTATIN 10 MG TABLET: 10 | 30 days supply | Qty: 30 | Fill #4

## 2015-09-23 MED FILL — SERTRALINE HCL 100 MG TAB: 100 | 30 days supply | Qty: 60 | Fill #5

## 2015-09-30 ENCOUNTER — Telehealth: Payer: Self-pay

## 2015-09-30 NOTE — Telephone Encounter (Signed)
I spoke to pt regarding her sleep study results. I advised her that her sleep study revealed mild osa and loud snoring and treatment is advised. PAP therapy is optional for her and her osa can be treated with a dental device. Dr. Brett Fairy recommends pt meet with her in an office visit to discuss preferred therapy. I advised pt that weight loss and positional therapy may are recommended. I advised pt that her degree of sleepiness is not fully understood in light of the mild AHI and PLM arousals. Dr. Brett Fairy may order a HLA narcolepsy test since pt's medications will not allow her to undergo a MSLT. I advised pt that her sleep study did reveal some PLMS result in additional sleep disruption. Pt is agreeable to an appt to discuss further. Pt can only come on Tuesdays. An appt was made with pt for 10/21/15 at 2:00. Pt is asking me to call her if any sooner appts come available. Pt verbalized understanding of results. Pt had no questions at this time but was encouraged to call back if questions arise.

## 2015-10-03 ENCOUNTER — Other Ambulatory Visit: Payer: Self-pay | Admitting: Family Medicine

## 2015-10-03 NOTE — Telephone Encounter (Signed)
Last filled:  08/05/15 Amt: 30, 0 Last OV: 07/22/15 Med filled.

## 2015-10-07 ENCOUNTER — Telehealth: Payer: Self-pay | Admitting: Family Medicine

## 2015-10-07 NOTE — Telephone Encounter (Signed)
yes

## 2015-10-07 NOTE — Telephone Encounter (Signed)
[facility]  [facility] Patient would like to have a physical at the initial visit. Can patient transfer?

## 2015-10-08 ENCOUNTER — Emergency Department
Admission: EM | Admit: 2015-10-08 | Discharge: 2015-10-08 | Disposition: A | Discharge status: Home / Self Care | Payer: 59 | Source: Home / Self Care | Attending: Family Medicine | Admitting: Family Medicine

## 2015-10-08 ENCOUNTER — Encounter: Payer: Self-pay | Admitting: *Deleted

## 2015-10-08 DIAGNOSIS — R300 Dysuria: Secondary | ICD-10-CM

## 2015-10-08 DIAGNOSIS — N3090 Cystitis, unspecified without hematuria: Secondary | ICD-10-CM | POA: Diagnosis not present

## 2015-10-08 LAB — POCT URINALYSIS DIP (MANUAL ENTRY)
Glucose, UA: NEGATIVE
Ketones, POC UA: NEGATIVE
NITRITE UA: NEGATIVE
PH UA: 5.5 (ref 5–8)
Spec Grav, UA: >=1.03 (ref 1.005–1.03)
Urobilinogen, UA: 0.2 (ref 0–1)

## 2015-10-08 MED ORDER — PHENAZOPYRIDINE HCL 200 MG PO TABS: 200.0000 mg | ORAL_TABLET | Freq: Three times a day (TID) | ORAL | Status: DC

## 2015-10-08 MED ORDER — NITROFURANTOIN MONOHYD MACRO 100 MG PO CAPS: 100.0000 mg | ORAL_CAPSULE | Freq: Two times a day (BID) | ORAL | Status: DC

## 2015-10-08 NOTE — Telephone Encounter (Signed)
I left a message on patient's voice mail to let her know she can schedule appointment with Dr.Duncan, but he won't have time to do a physical at the first visit.

## 2015-10-08 NOTE — Telephone Encounter (Signed)
Okay, would schedule when possible.  Thanks.  Tell her that at first visit, we'll do what we can.  Depending on her ongoing medical concerns, we likely will not be able to do "everything" including CPE at the visit.

## 2015-10-08 NOTE — ED Provider Notes (Signed)
CSN: [id number]     Arrival date & time 10/08/15  1824 History   First MD Initiated Contact with Patient 10/08/15 1857     Chief Complaint  Patient presents with  . Dysuria      HPI Comments: Patient reports 2 week history of intermittent dysuria.  During the past 2 days she has had constant dysuria, frequency, urgency, and nocturia with malodorous and cloudy urine.  No abdominal or flank pain.  No fevers, chills, and sweats.  Patient is a 50 y.o. female presenting with dysuria. The history is provided by the patient.  Dysuria Pain quality:  Burning Pain severity:  Mild Onset quality:  Gradual Duration:  2 weeks Timing:  Intermittent Progression:  Worsening Chronicity:  Recurrent Recent urinary tract infections: yes   Relieved by:  Nothing Worsened by:  Nothing tried Ineffective treatments:  Cranberry juice Urinary symptoms: discolored urine, foul-smelling urine, frequent urination and hesitancy   Urinary symptoms: no hematuria and no bladder incontinence   Associated symptoms: no abdominal pain, no fever, no flank pain, no genital lesions, no nausea, no vaginal discharge and no vomiting     Past Medical History  Diagnosis Date  . Hypertension   . Hypoglycemia   . Hyperlipemia   . Leg pain   . Chicken pox   . Depression   . Heart murmur   . Hypopituitarism (HCC)   . PTSD (post-traumatic stress disorder)   . Family history of adverse reaction to anesthesia     mother- nausea and vomiting   . Complication of anesthesia     oxygen level  and blood pressure dropped wtih anterior cervical fusion surgery   . Sleep apnea     no cpap   . Hypothyroidism   . GERD (gastroesophageal reflux disease)   . Amenorrhea   . Infertility, female    Past Surgical History  Procedure Laterality Date  . Eye surgery  [phone]    x's 3   . Cholecystectomy  97  . Cesarean section  91  . Breast reduction surgery  96  . Anterior cervical discectomy  2013    Ant Cervical Fusion C4-5 with  diskectomy  . Ganglion cyst removed    . Laparoscopic gastric sleeve resection N/A 05/28/2014    Procedure: LAPAROSCOPIC GASTRIC SLEEVE RESECTION;  Surgeon: Valarie Merino, MD;  Location: [facility];  Service: General;  Laterality: N/A;  . Upper gi endoscopy N/A 05/28/2014    Procedure: UPPER GI ENDOSCOPY;  Surgeon: Valarie Merino, MD;  Location: [facility];  Service: General;  Laterality: N/A;   Family History  Problem Relation Age of Onset  . Osteoarthritis Mother   . Cancer Mother 39    breast--stage III  invasive ductal ca  . Hypertension Father   . Melanoma Father   . Depression Father     PTSD  . Cancer Father     melanoma  . Alcoholism    . Ovarian cancer Maternal Grandmother   . Breast cancer Maternal Grandmother   . Lung cancer Maternal Grandmother   . Prostate cancer Paternal Grandfather   . Stroke      Paternal Family--9/12 children after age 51  . Anuerysm      Paternal family  . Hypertension      Paternal family   Social History  Substance Use Topics  . Smoking status: Never Smoker   . Smokeless tobacco: Never Used  . Alcohol Use: No   OB History    Gravida Para  Term Preterm AB TAB SAB Ectopic Multiple Living   6 1 1  5  5   1      Review of Systems  Constitutional: Negative for fever.  Gastrointestinal: Negative for nausea, vomiting and abdominal pain.  Genitourinary: Positive for dysuria. Negative for flank pain and vaginal discharge.  All other systems reviewed and are negative.   Allergies  Penicillins  Home Medications   Prior to Admission medications   Medication Sig Start Date End Date Taking? Authorizing Provider  atorvastatin (LIPITOR) 10 MG tablet TAKE 1 TABLET BY MOUTH DAILY. REPEAT LABS ARE DUE NOW 04/07/15   Lelon PerlaYvonne R Lowne Chase, DO  Cholecalciferol (VITAMIN D-3) 1000 UNITS CAPS Take 1 capsule by mouth daily. Reported on 09/16/2015    Historical Provider, MD  cyanocobalamin (,VITAMIN B-12,) 1000 MCG/ML injection Inject 1 mL (1,000 mcg total)  into the muscle every 30 (thirty) days. 06/11/14   Lelon PerlaYvonne R Lowne Chase, DO  lamoTRIgine (LAMICTAL) 150 MG tablet Take 150 mg by mouth every morning.    Historical Provider, MD  levothyroxine (SYNTHROID, LEVOTHROID) 75 MCG tablet TAKE 1 TABLET BY MOUTH DAILY BEFORE BREAKFAST. 08/20/15   Grayling CongressYvonne R Lowne Chase, DO  lisinopril (PRINIVIL,ZESTRIL) 5 MG tablet TAKE 1 TABLET BY MOUTH ONCE DAILY 09/22/15   Grayling CongressYvonne R Lowne Chase, DO  meloxicam (MOBIC) 15 MG tablet TAKE 1 TABLET (15 MG TOTAL) BY MOUTH DAILY. 10/03/15   Lelon PerlaYvonne R Lowne Chase, DO  Multiple Vitamin (MULTIVITAMIN) tablet Take 1 tablet by mouth daily.    Historical Provider, MD  nitrofurantoin, macrocrystal-monohydrate, (MACROBID) 100 MG capsule Take 1 capsule (100 mg total) by mouth 2 (two) times daily. Take with food. 10/08/15   Lattie HawStephen A Nishant Schrecengost, MD  phenazopyridine (PYRIDIUM) 200 MG tablet Take 1 tablet (200 mg total) by mouth 3 (three) times daily. Take after meals. 10/08/15   Lattie HawStephen A Neriyah Cercone, MD  pramipexole (MIRAPEX) 1 MG tablet Take 1 tablet by mouth at bedtime. 01/22/15   Historical Provider, MD  QUEtiapine (SEROQUEL) 200 MG tablet Take 2 tablets by mouth 2 (two) times daily. 01/22/15   Historical Provider, MD  sertraline (ZOLOFT) 100 MG tablet Take 200 mg by mouth every morning.  12/18/13   Donato SchultzYvonne R Lowne Chase, DO   Meds Ordered and Administered this Visit  Medications - No data to display  BP 126/84 mmHg  Pulse 61  Temp(Src) 98 F (36.7 C) (Oral)  Resp 16  Ht 4\' 9"  (1.448 m)  Wt 138 lb (62.596 kg)  BMI 29.85 kg/m2  SpO2 100% No data found.   Physical Exam Nursing notes and Vital Signs reviewed. Appearance:  Patient appears stated age, and in no acute distress.    Eyes:  Pupils are equal, round, and reactive to light and accomodation.  Extraocular movement is intact.  Conjunctivae are not inflamed   Pharynx:  Normal; moist mucous membranes  Neck:  Supple.  No adenopathy Lungs:  Clear to auscultation.  Breath sounds are equal.  Moving  air well. Heart:  Regular rate and rhythm without murmurs, rubs, or gallops.  Abdomen:  Nontender without masses or hepatosplenomegaly.  Bowel sounds are present.  No CVA or flank tenderness.  Extremities:  No edema.  Skin:  No rash present.    ED Course  Procedures none    Labs Reviewed  POCT URINALYSIS DIP (MANUAL ENTRY) - Abnormal; Notable for the following:    Clarity, UA cloudy (*)    Bilirubin, UA small (*)    Blood, UA moderate (*)  Protein Ur, POC =100 (*)    Leukocytes, UA large (3+) (*)    All other components within normal limits  URINE CULTURE      MDM   1. Dysuria   2. Cystitis    Urine culture pending. Begin Macrobid  BID for one week; Rx for Pyridium Increase fluid intake. If symptoms become significantly worse during the night or over the weekend, proceed to the local emergency room.  Followup with Family Doctor if not improved in one week.     Lattie Haw, MD 10/08/15 [phone]

## 2015-10-08 NOTE — Discharge Instructions (Signed)
Increase fluid intake. °If symptoms become significantly worse during the night or over the weekend, proceed to the local emergency room.  ° ° °Urinary Tract Infection °Urinary tract infections (UTIs) can develop anywhere along your urinary tract. Your urinary tract is your body's drainage system for removing wastes and extra water. Your urinary tract includes two kidneys, two ureters, a bladder, and a urethra. Your kidneys are a pair of bean-shaped organs. Each kidney is about the size of your fist. They are located below your ribs, one on each side of your spine. °CAUSES °Infections are caused by microbes, which are microscopic organisms, including fungi, viruses, and bacteria. These organisms are so small that they can only be seen through a microscope. Bacteria are the microbes that most commonly cause UTIs. °SYMPTOMS  °Symptoms of UTIs may vary by age and gender of the patient and by the location of the infection. Symptoms in young women typically include a frequent and intense urge to urinate and a painful, burning feeling in the bladder or urethra during urination. Older women and men are more likely to be tired, shaky, and weak and have muscle aches and abdominal pain. A fever may mean the infection is in your kidneys. Other symptoms of a kidney infection include pain in your back or sides below the ribs, nausea, and vomiting. °DIAGNOSIS °To diagnose a UTI, your caregiver will ask you about your symptoms. Your caregiver will also ask you to provide a urine sample. The urine sample will be tested for bacteria and white blood cells. White blood cells are made by your body to help fight infection. °TREATMENT  °Typically, UTIs can be treated with medication. Because most UTIs are caused by a bacterial infection, they usually can be treated with the use of antibiotics. The choice of antibiotic and length of treatment depend on your symptoms and the type of bacteria causing your infection. °HOME CARE  INSTRUCTIONS °· If you were prescribed antibiotics, take them exactly as your caregiver instructs you. Finish the medication even if you feel better after you have only taken some of the medication. °· Drink enough water and fluids to keep your urine clear or pale yellow. °· Avoid caffeine, tea, and carbonated beverages. They tend to irritate your bladder. °· Empty your bladder often. Avoid holding urine for long periods of time. °· Empty your bladder before and after sexual intercourse. °· After a bowel movement, women should cleanse from front to back. Use each tissue only once. °SEEK MEDICAL CARE IF:  °· You have back pain. °· You develop a fever. °· Your symptoms do not begin to resolve within 3 days. °SEEK IMMEDIATE MEDICAL CARE IF:  °· You have severe back pain or lower abdominal pain. °· You develop chills. °· You have nausea or vomiting. °· You have continued burning or discomfort with urination. °MAKE SURE YOU:  °· Understand these instructions. °· Will watch your condition. °· Will get help right away if you are not doing well or get worse. °  °This information is not intended to replace advice given to you by your health care provider. Make sure you discuss any questions you have with your health care provider. °  °Document Released: 02/03/2005 Document Revised: 01/15/2015 Document Reviewed: 06/04/2011 °Elsevier Interactive Patient Education ©2016 [employer] ° °

## 2015-10-08 NOTE — ED Notes (Signed)
Pt c/o dysuria and urinary urgency x 2 wks. Denies fever.

## 2015-10-11 ENCOUNTER — Telehealth: Payer: Self-pay | Admitting: Emergency Medicine

## 2015-10-11 LAB — URINE CULTURE

## 2015-10-11 NOTE — ED Notes (Signed)
Pt informed of urine culture results, will complete antbs and f/u prn.  Pt states that she is doing better.  TMartin,CMA

## 2015-10-14 ENCOUNTER — Telehealth: Payer: Self-pay | Admitting: Gastroenterology

## 2015-10-14 ENCOUNTER — Ambulatory Visit: Payer: Self-pay

## 2015-10-14 NOTE — Telephone Encounter (Signed)
Patient has called and would like to schedule a colonoscopy/ Patient is self referred. Patient advised that she will be called for a triage and then scheduled for the colonoscopy at that time. I have advised her that she should receive a call within 2-3 weeks.

## 2015-10-17 ENCOUNTER — Other Ambulatory Visit: Payer: Self-pay

## 2015-10-17 ENCOUNTER — Telehealth: Payer: Self-pay

## 2015-10-17 NOTE — Telephone Encounter (Signed)
Pt scheduled for screening colon[facility] on 12/16/15. Instructs mailed. [facility]. Please precert. UMR

## 2015-10-17 NOTE — Telephone Encounter (Signed)
Gastroenterology Pre-Procedure Review  Request Date: 12/16/15 Requesting Physician: Dr. Para Marchuncan  PATIENT REVIEW QUESTIONS: The patient responded to the following health history questions as indicated:    1. Are you having any GI issues? no 2. Do you have a personal history of Polyps? no 3. Do you have a family history of Colon Cancer or Polyps? no 4. Diabetes Mellitus? no 5. Joint replacements in the past 12 months?no 6. Major health problems in the past 3 months?no 7. Any artificial heart valves, MVP, or defibrillator?no    MEDICATIONS & ALLERGIES:    Patient reports the following regarding taking any anticoagulation/antiplatelet therapy:   Plavix, Coumadin, Eliquis, Xarelto, Lovenox, Pradaxa, Brilinta, or Effient? no Aspirin? no  Patient confirms/reports the following medications:  Current Outpatient Prescriptions  Medication Sig Dispense Refill  . atorvastatin (LIPITOR) 10 MG tablet TAKE 1 TABLET BY MOUTH DAILY. REPEAT LABS ARE DUE NOW 30 tablet 4  . Cholecalciferol (VITAMIN D-3) 1000 UNITS CAPS Take 1 capsule by mouth daily. Reported on 09/16/2015    . cyanocobalamin (,VITAMIN B-12,) 1000 MCG/ML injection Inject 1 mL (1,000 mcg total) into the muscle every 30 (thirty) days. 1 mL 0  . lamoTRIgine (LAMICTAL) 150 MG tablet Take 150 mg by mouth every morning.    Marland Kitchen. levothyroxine (SYNTHROID, LEVOTHROID) 75 MCG tablet TAKE 1 TABLET BY MOUTH DAILY BEFORE BREAKFAST. 30 tablet 5  . lisinopril (PRINIVIL,ZESTRIL) 5 MG tablet TAKE 1 TABLET BY MOUTH ONCE DAILY 30 tablet 5  . meloxicam (MOBIC) 15 MG tablet TAKE 1 TABLET (15 MG TOTAL) BY MOUTH DAILY. 30 tablet 2  . Multiple Vitamin (MULTIVITAMIN) tablet Take 1 tablet by mouth daily.    . nitrofurantoin, macrocrystal-monohydrate, (MACROBID) 100 MG capsule Take 1 capsule (100 mg total) by mouth 2 (two) times daily. Take with food. 14 capsule 0  . phenazopyridine (PYRIDIUM) 200 MG tablet Take 1 tablet (200 mg total) by mouth 3 (three) times daily. Take  after meals. 6 tablet 0  . pramipexole (MIRAPEX) 1 MG tablet Take 1 tablet by mouth at bedtime.  3  . QUEtiapine (SEROQUEL) 200 MG tablet Take 2 tablets by mouth 2 (two) times daily.  2  . sertraline (ZOLOFT) 100 MG tablet Take 200 mg by mouth every morning.      No current facility-administered medications for this visit.    Patient confirms/reports the following allergies:  Allergies  Allergen Reactions  . Penicillins Hives and Itching    No orders of the defined types were placed in this encounter.    AUTHORIZATION INFORMATION Primary Insurance: 1D#: Group #:  Secondary Insurance: 1D#: Group #:  SCHEDULE INFORMATION: Date: 12/16/15 Time: Location: [facility]

## 2015-10-17 NOTE — Telephone Encounter (Signed)
LVM for pt to return my call.

## 2015-10-21 ENCOUNTER — Ambulatory Visit: Payer: Self-pay

## 2015-10-21 ENCOUNTER — Encounter: Payer: Self-pay | Admitting: Neurology

## 2015-10-21 ENCOUNTER — Ambulatory Visit: Payer: Self-pay | Admitting: Family Medicine

## 2015-10-21 ENCOUNTER — Ambulatory Visit (INDEPENDENT_AMBULATORY_CARE_PROVIDER_SITE_OTHER): Payer: 59 | Admitting: Neurology

## 2015-10-21 VITALS — BP 112/80 | HR 78 | Resp 20 | Ht <= 58 in | Wt 127.0 lb

## 2015-10-21 DIAGNOSIS — G4730 Sleep apnea, unspecified: Secondary | ICD-10-CM | POA: Diagnosis not present

## 2015-10-21 DIAGNOSIS — R0683 Snoring: Secondary | ICD-10-CM | POA: Diagnosis not present

## 2015-10-21 DIAGNOSIS — G4710 Hypersomnia, unspecified: Secondary | ICD-10-CM | POA: Diagnosis not present

## 2015-10-21 DIAGNOSIS — F3175 Bipolar disorder, in partial remission, most recent episode depressed: Secondary | ICD-10-CM | POA: Diagnosis not present

## 2015-10-21 MED ORDER — MODAFINIL 100 MG PO TABS: 100.0000 mg | ORAL_TABLET | Freq: Every day | ORAL | Status: DC

## 2015-10-21 MED FILL — lamoTRIgine 150 MG TABS: 150 | 30 days supply | Qty: 30 | Fill #0

## 2015-10-21 MED FILL — MODAFINIL 100 MG TABLET: 100 | 30 days supply | Qty: 30 | Fill #0

## 2015-10-21 MED FILL — risperiDONE 2 MG TABS: 2 | 30 days supply | Qty: 30 | Fill #0

## 2015-10-21 NOTE — Patient Instructions (Signed)
Hypersomnia Hypersomnia is when you feel extremely tired during the day even though you're getting plenty of sleep at night. You may need to take naps during the day, and you may also be extremely difficult to wake up when you are sleeping.  CAUSES  The cause of your hypersomnia may not be known. Hypersomnia may be caused by:   Medicines.  Sleep disorders, such as narcolepsy.  Trauma or injury to your head or nervous system.  Using drugs or alcohol.  Tumors.  Medical conditions, such as depression or hypothyroidism.  Genetics. SIGNS AND SYMPTOMS  The main symptoms of hypersomnia include:   Feeling extremely tired throughout the day.  Being very difficult to wake up.  Sleeping for longer and longer periods.  Taking naps throughout the day. Other symptoms may include:   Feeling:  Restless.  Annoyed.  Anxious.  Low energy.  Having difficulty:  Remembering.  Speaking.  Thinking.  Losing your appetite.  Experiencing hallucinations. DIAGNOSIS  Hypersomnia may be diagnosed by:  Medical history and physical exam. This will include a sleep history.  Completing sleep logs.  Tests may also be done, such as:  Polysomnography.  Multiple sleep latency test (MSLT). TREATMENT  There is no cure for hypersomnia, but treatment can be very effective in helping manage the condition. Treatment may include:  Lifestyle and sleeping strategies to help cope with the condition.  Stimulant medicines.  Treating any underlying causes of hypersomnia. HOME CARE INSTRUCTIONS  Take medicines only as directed by your health care provider.  Schedule short naps for when you feel sleepiest during the day. Tell your employer or teachers that you have hypersomnia. You may be able to adjust your schedule to include time for naps.  Avoid drinking alcohol or caffeinated beverages.  Do not eat a heavy meal before bedtime. Eat at about the same times every day.  Do not drive or  operate heavy machinery if you are sleepy.  Do not swim or go out on the water without a life jacket.  If possible, adjust your schedule so that you do not have to work or be active at night.  Keep all follow-up visits as directed by your health care provider. This is important. SEEK MEDICAL CARE IF:   You have new symptoms.  Your symptoms get worse. SEEK IMMEDIATE MEDICAL CARE IF:  You have serious thoughts of hurting yourself or someone else.   This information is not intended to replace advice given to you by your health care provider. Make sure you discuss any questions you have with your health care provider.   Document Released: 04/16/2002 Document Revised: 05/17/2014 Document Reviewed: 11/29/2013 Elsevier Interactive Patient Education 2016 [employer] Inc[employer]  

## 2015-10-21 NOTE — Progress Notes (Signed)
SLEEP MEDICINE CLINIC   Provider:  Melvyn Novas, M D  Referring Provider: Zola Button, Grayling Congress, * Primary Care Physician:  Donato Schultz, DO  Chief Complaint  Patient presents with  . Follow-up    discuss sleep study results    HPI:  Janet Turner is a 50 y.o. female , seen here as a referral  from Dr. Zola Button for a sleep apnea re -evaluation,   Janet Turner is new patient to our sleep clinic. She has a past history of hypertension, hypoglycemia, heart murmur, hypopituitarism, PTSD, hypothyroidism and a menorrhea. She also carries a diagnosis of gastroesophageal reflux disease. She had 3 times undergone eye surgery in 1968, 1970 1982. Her current chief complaint is that of excessive daytime sleepiness reflected in an Epworth score of 17 points. It turns out that she is snoring, but she has excessive daytime sleepiness but also restless legs, and that she was tested and diagnosed with sleep apnea on 02-12-2013 at [facility]. I "hear from her previous sleep study Epworth sleepiness score was endorsed at 17, insomnia severity index at 10, Becks depression scale at 7 body mass index was named at 39.5 in the meantime the patient is undergone a gastric sleeve surgery and her body mass index has significantly reduced. She was diagnosed with sleep apnea at a total AHI of 22.4 in supine, 52.0 in nonsupine and a total of 25.3. REM AHI was 12.6, total number of obstructive apneas were 25 total number of central apneas 2,  hypopneas  92. The same night she was titrated to CPAP beginning at 4 cm water and to a final pressure of 10 cm water. I would like to add that the patient never reached 0 apneas and that the AHI index under 10 cm water was still 14.7 indicated that she did not have a complete night titration non-of the pressures explored was adequate to control all respiratory events. For this reason Dr. Luberta Robertson and Dr Ninfa Linden ordered an hour to titrated  between 9 and 15 cm water. [name] took theCPAP prescription with her and actually used CPAP a couple of nights 4 weeks but then lost interest.  She had started to feel better and have made the decision to undergo a weight loss surgery. For this reason she returned CPAP based on being deemed noncompliant.  On 05/28/2014 she underwent a gastric sleeve procedure and since then has lost 75 pounds. While this habits many positive effects for her overall health she remained as sleepy as before. She is now wondering if she still has sleep apnea. Chief complaint according to patient :I am sleepy .   Sleep habits are as follows:The patient usually goes to bed at 9 PM -with the help of Seroquel  400 mg which helps her to sleep. Her husband reports her to snore and gasp, gurgle. Her husband is witnessing this. She wakes at 2 AM form the difficulties to breath yet is under the influence of the medication. She gets a snack, she watches TV at night, and sometimes falls asleep with food in her mouth. She is showing other risky behavior with micowaving food at night in a sleep walking state. She will try to read to help to get back to sleep, she doesn't comprehend , dozing off, dozing out. She feels hung over. During the week she has to arise at 5:45 AM to get ready for work. She needs an alarm in the morning badly. She repeatedly  uses the snooze button she is not refreshed or restored in the morning. Some mornings she will have a headache, she frequently has a dry, almost parched mouth.  She reports driving under the same medication effect. She knows that this is unsafe, and she is now willing to be retested and hopefully CPAP can control her sleepiness. She is besides the Seroquel and other medications that could affect her sleep including Synthroid, Lamictal which sometimes causes insomnia, Mirapex which can cause micro- sleep attacks, and Zoloft. Caucasian , 50 year old married female , who sleeps in the same room as  her husband, cool, quiet and dark. Sleeps on her side, but always ends in supine. Sleep talking starts 120 minutes after sleep . She does not drink any caffeine containing  beverages, no alcohol, she does not use tobacco products. 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   Interval history from 10/21/2015, I have the pleasure of seeing Janet Turner today following her recent sleep study. The patient underwent a polysomnography on 09/22/2015 which resulted in a mild apnea diagnosis the AHI was 9.2 the RDI was 16.9 indicating loud snoring, the AHI was not positional dependent, there were no prolonged oxygen desaturations but a short time of Carbone dioxide retention noted. Based on the very mild degree of sleep apnea I suggested that a dental device may be working for this patient rather than a CPAP machine. The patient just had retainers fixed in place is to her upper jaw so I'm not sure that she is a candidate for a dental device based on her dental needs. She was not happy with CPAP years ago but she changed and there are many interfaces are more comfortable than she used to CPAP very briefly she felt it did help but she could just get used to this device.    Review of Systems: Out of a complete 14 system review, the patient complains of only the following symptoms, and all other reviewed systems are negative. She has not experienced sleep paralysis, she sometimes seems to have a very short dream latency, possible hypoechoic hallucinations, she has experienced cataplectic attacks when angry or upset, her knees will buckle. She fell asleep driving- was clear;y told to have CPAP in place before she continues.   Epworth score 18 , Fatigue severity score 23  , depression score 2  Driving restricition in place.    Social History   Social History  . Marital Status: Married    Spouse Name: N/A  . Number of Children: N/A  . Years of Education: N/A   Occupational History  . Not on file.   Social History Main  Topics  . Smoking status: Never Smoker   . Smokeless tobacco: Never Used  . Alcohol Use: No  . Drug Use: No  . Sexual Activity:    Partners: Male   Other Topics Concern  . Not on file   Social History Narrative    Family History  Problem Relation Age of Onset  . Osteoarthritis Mother   . Cancer Mother 1669    breast--stage III  invasive ductal ca  . Hypertension Father   . Melanoma Father   . Depression Father     PTSD  . Cancer Father     melanoma  . Alcoholism    . Ovarian cancer Maternal Grandmother   . Breast cancer Maternal Grandmother   . Lung cancer Maternal Grandmother   . Prostate cancer Paternal Grandfather   . Stroke  Paternal Family--9/12 children after age 29  . Anuerysm      Paternal family  . Hypertension      Paternal family    Past Medical History  Diagnosis Date  . Hypertension   . Hypoglycemia   . Hyperlipemia   . Leg pain   . Chicken pox   . Depression   . Heart murmur   . Hypopituitarism (HCC)   . PTSD (post-traumatic stress disorder)   . Family history of adverse reaction to anesthesia     mother- nausea and vomiting   . Complication of anesthesia     oxygen level  and blood pressure dropped wtih anterior cervical fusion surgery   . Sleep apnea     no cpap   . Hypothyroidism   . GERD (gastroesophageal reflux disease)   . Amenorrhea   . Infertility, female     Past Surgical History  Procedure Laterality Date  . Eye surgery  [phone]    x's 3   . Cholecystectomy  97  . Cesarean section  91  . Breast reduction surgery  96  . Anterior cervical discectomy  2013    Ant Cervical Fusion C4-5 with diskectomy  . Ganglion cyst removed    . Laparoscopic gastric sleeve resection N/A 05/28/2014    Procedure: LAPAROSCOPIC GASTRIC SLEEVE RESECTION;  Surgeon: Valarie Merino, MD;  Location: [facility];  Service: General;  Laterality: N/A;  . Upper gi endoscopy N/A 05/28/2014    Procedure: UPPER GI ENDOSCOPY;  Surgeon: Valarie Merino, MD;   Location: [facility];  Service: General;  Laterality: N/A;    Current Outpatient Prescriptions  Medication Sig Dispense Refill  . atorvastatin (LIPITOR) 10 MG tablet TAKE 1 TABLET BY MOUTH DAILY. REPEAT LABS ARE DUE NOW 30 tablet 4  . cyanocobalamin (,VITAMIN B-12,) 1000 MCG/ML injection Inject 1 mL (1,000 mcg total) into the muscle every 30 (thirty) days. 1 mL 0  . lamoTRIgine (LAMICTAL) 150 MG tablet Take 150 mg by mouth every morning.    Marland Kitchen levothyroxine (SYNTHROID, LEVOTHROID) 75 MCG tablet TAKE 1 TABLET BY MOUTH DAILY BEFORE BREAKFAST. 30 tablet 5  . lisinopril (PRINIVIL,ZESTRIL) 5 MG tablet TAKE 1 TABLET BY MOUTH ONCE DAILY 30 tablet 5  . meloxicam (MOBIC) 15 MG tablet TAKE 1 TABLET (15 MG TOTAL) BY MOUTH DAILY. 30 tablet 2  . Multiple Vitamin (MULTIVITAMIN) tablet Take 1 tablet by mouth daily. Reported on 10/17/2015    . pramipexole (MIRAPEX) 1 MG tablet Take 1 tablet by mouth at bedtime.  3  . risperiDONE (RISPERDAL) 2 MG tablet Take 2 mg by mouth at bedtime.    . sertraline (ZOLOFT) 100 MG tablet Take 200 mg by mouth every morning.      No current facility-administered medications for this visit.    Allergies as of 10/21/2015 - Review Complete 10/21/2015  Allergen Reaction Noted  . Penicillins Hives and Itching 09/03/2012    Vitals: There were no vitals taken for this visit. Last Weight:  Wt Readings from Last 1 Encounters:  10/08/15 138 lb (62.596 kg)   BJY:NWGNF is no weight on file to calculate BMI.     Last Height:   Ht Readings from Last 1 Encounters:  10/08/15 4\' 9"  (1.448 m)    Physical exam:  General: The patient is awake, alert and appears not in acute distress. The patient is well groomed. Head: Normocephalic, atraumatic. Neck is supple. Mallampati 3,  neck circumference:12 Nasal airflow free, TMJ is not evident . Retrognathia  is not seen.  She wears retainers.  Cardiovascular:  Regular rate and rhythm, without  murmurs or carotid bruit, and without distended neck  veins. Respiratory: Lungs are clear to auscultation. Skin:  Without evidence of edema, or rash Trunk: BMI is elevated . The patient's posture is erect.   Neurologic exam : The patient is awake and alert, oriented to place and time.   Memory subjective  described as intact.  Attention span & concentration ability appears normal.  Speech is fluent,  without dysarthria, dysphonia or aphasia.  Mood and affect are appropriate.  Cranial nerves: Pupils are equal and briskly reactive to light. Funduscopic exam without evidence of pallor or edema.  Extraocular movements  in vertical and horizontal planes intact and without nystagmus. Visual fields by finger perimetry are intact. Hearing to finger rub intact.   Facial sensation intact to fine touch.  Facial motor strength is symmetric and tongue and uvula move midline. Shoulder shrug was symmetrical.   Motor exam: Normal tone, muscle bulk and symmetric strength in all extremities.  Sensory:  Fine touch, pinprick and vibration - Proprioception tested in the upper extremities was normal.  Coordination: Rapid alternating movements and  Finger-to-nose maneuver  normal without evidence of ataxia, dysmetria or tremor.  Gait and station: Patient walks without assistive device and is able unassisted to climb up to the exam table. Strength within normal limits.  Stance is stable and normal.    Deep tendon reflexes: in the  upper and lower extremities are symmetric and intact. Babinski maneuver response is downgoing.  The patient was advised of the nature of the diagnosed sleep disorder , the treatment options and risks for general a health and wellness arising from not treating the condition.  I spent more than  minutes of face to face time with the patient.  Greater than 50% of time was spent in counseling and coordination of care. We have discussed the diagnosis and differential and I answered the patient's questions.     Assessment:  After  physical and neurologic examination, review of laboratory studies,  Personal review of imaging studies, reports of other /same  Imaging studies ,  Results of polysomnography/ neurophysiology testing and pre-existing records as far as provided in visit., my assessment is   1) Mrs. Stach has a long-standing history of severe hypersomnia, which could be partially medication induced but she was also tested positive for obstructive sleep apnea at a moderate degree in 2014. At the time CPAP seemed to be less attractive to her and she chose a weight loss surgery, assuming that this would treat the apnea 2. I am not sure if she still has apnea but she definitely is still a snorer and she has developed some parasomnia activities. I have reviewed her medication list and there are several medications that also could give her additional sleepiness. Especially warned her about Mirapex and Requip which can cause Micro sleep attacks. Epworth score 18 , Fatigue severity score 23  , depression score 2  Driving restricition in place.    2) I will order a CPAP titration. She cannot have a dental device.   Plan:  Treatment plan and additional workup :   Narcolepsy follow up- not to be able to a have a valid MSLT. Until 30 days on CPAP.   Porfirio Mylar Dmonte Maher MD  10/21/2015   CC: Donato Schultz, Do [address] [place], [place] [address]

## 2015-10-24 MED FILL — ATORVASTATIN 10 MG TABLET: 10 | 30 days supply | Qty: 30 | Fill #5

## 2015-10-24 MED FILL — LISINOPRIL 5 MG TABLET: 5 | 30 days supply | Qty: 30 | Fill #1

## 2015-10-24 MED FILL — SERTRALINE HCL 100 MG TAB: 100 | 30 days supply | Qty: 60 | Fill #0

## 2015-10-27 ENCOUNTER — Telehealth: Payer: Self-pay | Admitting: Family Medicine

## 2015-10-27 DIAGNOSIS — N390 Urinary tract infection, site not specified: Secondary | ICD-10-CM | POA: Diagnosis not present

## 2015-10-27 DIAGNOSIS — R531 Weakness: Secondary | ICD-10-CM | POA: Diagnosis not present

## 2015-10-27 DIAGNOSIS — E7800 Pure hypercholesterolemia, unspecified: Secondary | ICD-10-CM | POA: Diagnosis not present

## 2015-10-27 DIAGNOSIS — R51 Headache: Secondary | ICD-10-CM | POA: Diagnosis not present

## 2015-10-27 DIAGNOSIS — F329 Major depressive disorder, single episode, unspecified: Secondary | ICD-10-CM | POA: Diagnosis not present

## 2015-10-27 DIAGNOSIS — I10 Essential (primary) hypertension: Secondary | ICD-10-CM | POA: Diagnosis not present

## 2015-10-27 DIAGNOSIS — Z79899 Other long term (current) drug therapy: Secondary | ICD-10-CM | POA: Diagnosis not present

## 2015-10-27 DIAGNOSIS — R404 Transient alteration of awareness: Secondary | ICD-10-CM | POA: Diagnosis not present

## 2015-10-27 DIAGNOSIS — F430 Acute stress reaction: Secondary | ICD-10-CM | POA: Diagnosis not present

## 2015-10-27 NOTE — Telephone Encounter (Signed)
Caller name: Elnita MaxwellCheryl Relation to pt: self Call back number: [phone]  Pharmacy:  Reason for call: Pt called stating had a UTI / bladder infection and was given medication for it, pt states is having another uti and would like to be referral to a specialist since she has been getting it very often, having frequency going to the bathroom but can not do much and is getting some stabbling pain. Please advise.

## 2015-10-27 NOTE — Telephone Encounter (Signed)
Message left to call the office.    KP 

## 2015-10-27 NOTE — Telephone Encounter (Signed)
Please advise if a referral is appropriate.   KP

## 2015-10-27 NOTE — Telephone Encounter (Signed)
Patient Name: Janet BooneCHERYL Turner  DOB: 05/09/1966    Initial Comment Woke up this morning with a headache and dizziness - now head is in a fog, almost fell asleep going into work. feels like she can't get her breath    Nurse Assessment  Nurse: Stefano GaulStringer, RN, Dwana CurdVera Date/Time Lamount Cohen(Eastern Time): 10/27/2015 12:40:37 PM  Confirm and document reason for call. If symptomatic, describe symptoms. You must click the next button to save text entered. ---Caller states she woke up with a headache and dizziness. She kept falling asleep driving to work. She is at work and "feels like she is a fog". She is taking provigil.  Has the patient traveled out of the country within the last 30 days? ---Not Applicable  Does the patient have any new or worsening symptoms? ---Yes  Will a triage be completed? ---Yes  Related visit to physician within the last 2 weeks? ---No  Does the PT have any chronic conditions? (i.e. diabetes, asthma, etc.) ---Yes  List chronic conditions. ---HTN  Is the patient pregnant or possibly pregnant? (Ask all females between the ages of 5512-55) ---No  Is this a behavioral health or substance abuse call? ---No     Guidelines    Guideline Title Affirmed Question Affirmed Notes  Dizziness - Vertigo Difficult to awaken or acting confused (e.g., disoriented, slurred speech)    Final Disposition User   Call EMS 911 Now Stefano GaulStringer, RN, Vera    [facility]   Disagree/Comply: Comply

## 2015-10-27 NOTE — Telephone Encounter (Signed)
We can refer to urology but we can also check it in the labs to see if she needs treatment It may take several days to get her into urology

## 2015-10-28 ENCOUNTER — Encounter: Payer: Self-pay | Admitting: Family Medicine

## 2015-10-28 ENCOUNTER — Ambulatory Visit (INDEPENDENT_AMBULATORY_CARE_PROVIDER_SITE_OTHER): Payer: 59 | Admitting: Behavioral Health

## 2015-10-28 DIAGNOSIS — R0683 Snoring: Secondary | ICD-10-CM | POA: Diagnosis not present

## 2015-10-28 DIAGNOSIS — G4710 Hypersomnia, unspecified: Secondary | ICD-10-CM | POA: Diagnosis not present

## 2015-10-28 DIAGNOSIS — E538 Deficiency of other specified B group vitamins: Secondary | ICD-10-CM | POA: Diagnosis not present

## 2015-10-28 DIAGNOSIS — G4730 Sleep apnea, unspecified: Secondary | ICD-10-CM | POA: Diagnosis not present

## 2015-10-28 MED ORDER — CYANOCOBALAMIN 1000 MCG/ML IJ SOLN
1000.0000 ug | Freq: Once | INTRAMUSCULAR | Status: AC
  Administered 2015-10-28: 1000 ug via INTRAMUSCULAR

## 2015-10-28 MED FILL — CEPHALEXIN 500 MG CAPSULE: 500 | 5 days supply | Qty: 20 | Fill #0

## 2015-10-28 NOTE — Progress Notes (Signed)
Pre visit review using our clinic review tool, if applicable. No additional management support is needed unless otherwise documented below in the visit note.  Patient in office today for B12 injection. IM given in Left Deltoid. Patient tolerated injection well. Next appointment scheduled for 11/25/15 at 3:00 PM.

## 2015-10-28 NOTE — Telephone Encounter (Signed)
Ref has been placed. Per note int he chart the patient was sent to the ED by the call a nurse.     KP

## 2015-10-28 NOTE — Telephone Encounter (Signed)
Pt returned CMA 's call.    ° ° ° °

## 2015-10-28 NOTE — Telephone Encounter (Signed)
Patient was in the office and she is being treated for the UTI with Keflex, since they are recurrent she will keep the referral with the urologist.    KP

## 2015-11-03 ENCOUNTER — Telehealth: Payer: Self-pay

## 2015-11-03 NOTE — Telephone Encounter (Signed)
Patient called to cancel her colonoscopy. She stated that they will be out of town and would call to reschedule.

## 2015-11-04 ENCOUNTER — Ambulatory Visit: Payer: Self-pay

## 2015-11-04 ENCOUNTER — Telehealth: Payer: Self-pay | Admitting: Neurology

## 2015-11-04 DIAGNOSIS — F3175 Bipolar disorder, in partial remission, most recent episode depressed: Secondary | ICD-10-CM | POA: Diagnosis not present

## 2015-11-04 NOTE — Telephone Encounter (Signed)
Patient is calling to get the results of her lab work last week with [facility]  Please call.

## 2015-11-04 NOTE — Telephone Encounter (Signed)
Spoke with patient and informed her Dr Vickey Hugerohmeier is out office as well as her RN, Baxter HireKristen. Advised this RN will let Baxter HireKristen know she is waiting on lab results. Fredirick Maudlindvised Kristen will call her back when she returns to the office. Patient requested to be called at work, [phone] from 8a- 5 p. If she is called between 12-1 pm she requested to be called on her cell phone.  She verbalized understanding, appreciation of call.

## 2015-11-05 NOTE — Telephone Encounter (Signed)
I called pt. She says that she had her narcolepsy test drawn at [facility] [place] on [address] on 10/28/15. I advised her that we do not have those results yet but that I would check with [facility]. I advised her that Dr. Vickey Hugerohmeier is out of the office until 11/17/15 so we would have to wait until then for Dr. Vickey Hugerohmeier to interpret the results anyways. Pt verbalized understanding. I checked with Elnita Maxwellheryl, our [facility] tech, and she is looking in to the test results.

## 2015-11-06 LAB — NARCOLEPSY EVALUATION
DQB1*06:02: NEGATIVE
HLA-DQ ALPHA: NEGATIVE

## 2015-11-07 MED FILL — LEVOTHYROXINE 75 MCG TABLET: 75 | 30 days supply | Qty: 30 | Fill #0

## 2015-11-13 ENCOUNTER — Ambulatory Visit: Payer: Self-pay | Admitting: Family Medicine

## 2015-11-18 MED FILL — LISINOPRIL 5 MG TABLET: 5 | 30 days supply | Qty: 30 | Fill #2

## 2015-11-25 ENCOUNTER — Encounter: Payer: Self-pay | Admitting: Family Medicine

## 2015-11-25 ENCOUNTER — Telehealth: Payer: Self-pay | Admitting: Neurology

## 2015-11-25 ENCOUNTER — Ambulatory Visit: Payer: Self-pay

## 2015-11-25 ENCOUNTER — Ambulatory Visit (INDEPENDENT_AMBULATORY_CARE_PROVIDER_SITE_OTHER): Payer: 59 | Admitting: *Deleted

## 2015-11-25 DIAGNOSIS — E538 Deficiency of other specified B group vitamins: Secondary | ICD-10-CM | POA: Diagnosis not present

## 2015-11-25 DIAGNOSIS — F3175 Bipolar disorder, in partial remission, most recent episode depressed: Secondary | ICD-10-CM | POA: Diagnosis not present

## 2015-11-25 MED ORDER — CYANOCOBALAMIN 1000 MCG/ML IJ SOLN
1000.0000 ug | Freq: Once | INTRAMUSCULAR | Status: AC
  Administered 2015-11-25: 1000 ug via INTRAMUSCULAR

## 2015-11-25 MED FILL — lamoTRIgine 150 MG TABS: 150 | 30 days supply | Qty: 60 | Fill #0

## 2015-11-25 NOTE — Telephone Encounter (Signed)
I need to know which role OSA may play in her daytime sleepiness, since HLA narcolelpsy test was negative.

## 2015-11-25 NOTE — Progress Notes (Signed)
Pre visit review using our clinic review tool, if applicable. No additional management support is needed unless otherwise documented below in the visit note.  Patient tolerated injection well.  Next appointment: 12/23/15  Starla Linkarolyn J Jayra Choyce, RN

## 2015-11-25 NOTE — Telephone Encounter (Signed)
I spoke to pt and advised her that Dr. Vickey Hugerohmeier needs to try and treat her osa to find out how much this contributes to her daytime sleepiness. Pt is agreeable to coming in for a cpap titration study and is asking that the sleep lab please call her to schedule. Please use her cell phone number.

## 2015-11-25 NOTE — Telephone Encounter (Signed)
I spoke to pt. She says that she received her hla narcolepsy blood work back and it was negative. She decided to cancel her cpap titration study because of this. However, she is still sleepy and wants to know what Dr. Brett Fairy wants to do next. Pt does not want a cpap.  In Dr. Edwena Felty last note, it says that before an MSLT can be performed, pt needs to be on CPAP for at least 30 days. "Pt cannot have a dental device".  Pt wants to know if she should proceed with cpap titration so she may have the MSLT (hla results were negative).

## 2015-11-25 NOTE — Telephone Encounter (Signed)
I called pt to discuss. No answer, left a message asking her to call me back. 

## 2015-11-25 NOTE — Telephone Encounter (Signed)
Pt called in about not having a sleep study. She cancelled her appt and would like to know what she needs to do now. Please call [phone]

## 2015-11-27 ENCOUNTER — Other Ambulatory Visit: Payer: Self-pay | Admitting: Family Medicine

## 2015-11-27 MED FILL — ATORVASTATIN 10 MG TABLET: 10 | 30 days supply | Qty: 30 | Fill #0

## 2015-11-28 MED FILL — SERTRALINE HCL 100 MG TAB: 100 | 30 days supply | Qty: 45 | Fill #0

## 2015-11-30 ENCOUNTER — Emergency Department (HOSPITAL_BASED_OUTPATIENT_CLINIC_OR_DEPARTMENT_OTHER)
Admission: EM | Admit: 2015-11-30 | Discharge: 2015-11-30 | Disposition: A | Discharge status: Home / Self Care | Payer: 59 | Attending: Emergency Medicine | Admitting: Emergency Medicine

## 2015-11-30 ENCOUNTER — Encounter (HOSPITAL_BASED_OUTPATIENT_CLINIC_OR_DEPARTMENT_OTHER): Payer: Self-pay | Admitting: Emergency Medicine

## 2015-11-30 DIAGNOSIS — R011 Cardiac murmur, unspecified: Secondary | ICD-10-CM | POA: Diagnosis not present

## 2015-11-30 DIAGNOSIS — F329 Major depressive disorder, single episode, unspecified: Secondary | ICD-10-CM | POA: Diagnosis not present

## 2015-11-30 DIAGNOSIS — E039 Hypothyroidism, unspecified: Secondary | ICD-10-CM | POA: Diagnosis not present

## 2015-11-30 DIAGNOSIS — I10 Essential (primary) hypertension: Secondary | ICD-10-CM | POA: Diagnosis not present

## 2015-11-30 DIAGNOSIS — R42 Dizziness and giddiness: Secondary | ICD-10-CM | POA: Insufficient documentation

## 2015-11-30 DIAGNOSIS — R079 Chest pain, unspecified: Secondary | ICD-10-CM | POA: Insufficient documentation

## 2015-11-30 DIAGNOSIS — E785 Hyperlipidemia, unspecified: Secondary | ICD-10-CM | POA: Diagnosis not present

## 2015-11-30 LAB — CBC WITH DIFFERENTIAL/PLATELET
BASOS ABS: 0 10*3/uL (ref 0.0–0.1)
BASOS PCT: 1 %
EOS ABS: 0.1 10*3/uL (ref 0.0–0.7)
EOS PCT: 2 %
HEMATOCRIT: 39.5 % (ref 36.0–46.0)
Hemoglobin: 13.1 g/dL (ref 12.0–15.0)
Lymphocytes Relative: 36 %
Lymphs Abs: 2.2 10*3/uL (ref 0.7–4.0)
MCH: 29.8 pg (ref 26.0–34.0)
MCHC: 33.2 g/dL (ref 30.0–36.0)
MCV: 90 fL (ref 78.0–100.0)
MONO ABS: 0.5 10*3/uL (ref 0.1–1.0)
MONOS PCT: 8 %
Neutro Abs: 3.2 10*3/uL (ref 1.7–7.7)
Neutrophils Relative %: 53 %
PLATELETS: 214 10*3/uL (ref 150–400)
RBC: 4.39 MIL/uL (ref 3.87–5.11)
RDW: 12.8 % (ref 11.5–15.5)
WBC: 5.9 10*3/uL (ref 4.0–10.5)

## 2015-11-30 LAB — BASIC METABOLIC PANEL
ANION GAP: 8 (ref 5–15)
BUN: 13 mg/dL (ref 6–20)
CALCIUM: 9.2 mg/dL (ref 8.9–10.3)
CO2: 30 mmol/L (ref 22–32)
CREATININE: 0.78 mg/dL (ref 0.44–1.00)
Chloride: 99 mmol/L — ABNORMAL LOW (ref 101–111)
Glucose, Bld: 87 mg/dL (ref 65–99)
Potassium: 4.1 mmol/L (ref 3.5–5.1)
Sodium: 137 mmol/L (ref 135–145)

## 2015-11-30 LAB — URINALYSIS, ROUTINE W REFLEX MICROSCOPIC
BILIRUBIN URINE: NEGATIVE
GLUCOSE, UA: NEGATIVE mg/dL
Hgb urine dipstick: NEGATIVE
KETONES UR: NEGATIVE mg/dL
LEUKOCYTES UA: NEGATIVE
NITRITE: NEGATIVE
PH: 8 (ref 5.0–8.0)
PROTEIN: NEGATIVE mg/dL
Specific Gravity, Urine: 1.012 (ref 1.005–1.030)

## 2015-11-30 LAB — TROPONIN I: Troponin I: <0.03 ng/mL (ref <0.03)

## 2015-11-30 NOTE — ED Provider Notes (Signed)
[facility] Provider Note   CSN: [id number] Arrival date & time: 11/30/15  0941  First Provider Contact:  None       History   Chief Complaint Chief Complaint  Patient presents with  . Dizziness    HPI Janet Turner is a 50 y.o. female.   Dizziness  Quality:  Imbalance Severity:  Mild Onset quality:  Gradual Duration:  3 hours Chronicity:  New Relieved by:  None tried Worsened by:  Nothing Ineffective treatments:  None tried Associated symptoms: chest pain (one fleeting episode earlier today)   Associated symptoms: no diarrhea, no nausea and no shortness of breath   Risk factors: no anemia, no hx of vertigo and no multiple medications     Past Medical History:  Diagnosis Date  . Amenorrhea   . Chicken pox   . Complication of anesthesia    oxygen level  and blood pressure dropped wtih anterior cervical fusion surgery   . Depression   . Family history of adverse reaction to anesthesia    mother- nausea and vomiting   . GERD (gastroesophageal reflux disease)   . Heart murmur   . Hyperlipemia   . Hypertension   . Hypoglycemia   . Hypopituitarism (HCC)   . Hypothyroidism   . Infertility, female   . Leg pain   . PTSD (post-traumatic stress disorder)   . Sleep apnea    no cpap     Patient Active Problem List   Diagnosis Date Noted  . Primary snoring 10/21/2015  . Hypersomnia with sleep apnea 09/03/2015  . Snoring 09/03/2015  . Cataplexy 09/03/2015  . Right knee pain 07/22/2015  . Right arm weakness 02/25/2015  . B12 deficiency 11/19/2014  . Restless leg 11/19/2014  . Status post laparoscopic sleeve gastrectomy+HH repair Jan 2016 05/30/2014  . Morbid obesity (HCC) 05/28/2014  . Undiagnosed cardiac murmurs 03/21/2014  . Gastroesophageal reflux disease without esophagitis 03/19/2014  . Viral gastroenteritis 03/19/2014  . Bipolar 1 disorder, mixed, moderate (HCC) 01/18/2014  . Hyponatremia 11/27/2013  . History of bipolar  disorder 11/27/2013  . Encephalopathy 11/27/2013  . HTN (hypertension) 07/23/2013  . Other and unspecified hyperlipidemia 07/23/2013  . Hypothyroidism 07/23/2013  . PTSD (post-traumatic stress disorder) 07/23/2013  . Hypopituitary dwarfism (HCC) 07/23/2013  . Obesity (BMI 30-39.9) 07/23/2013  . Ganglion cyst 07/23/2013    Past Surgical History:  Procedure Laterality Date  . ANTERIOR CERVICAL DISCECTOMY  2013   Ant Cervical Fusion C4-5 with diskectomy  . BREAST REDUCTION SURGERY  96  . CESAREAN SECTION  91  . CHOLECYSTECTOMY  97  . EYE SURGERY  [phone]   x's 3   . ganglion cyst removed    . LAPAROSCOPIC GASTRIC SLEEVE RESECTION N/A 05/28/2014   Procedure: LAPAROSCOPIC GASTRIC SLEEVE RESECTION;  Surgeon: Valarie Merino, MD;  Location: [facility];  Service: General;  Laterality: N/A;  . UPPER GI ENDOSCOPY N/A 05/28/2014   Procedure: UPPER GI ENDOSCOPY;  Surgeon: Valarie Merino, MD;  Location: [facility];  Service: General;  Laterality: N/A;    OB History    Gravida Para Term Preterm AB Living   6 1 1   5 1    SAB TAB Ectopic Multiple Live Births   5               Home Medications    Prior to Admission medications   Medication Sig Start Date End Date Taking? Authorizing Provider  atorvastatin (LIPITOR) 10 MG tablet Take 1 tablet (10  mg total) by mouth daily. 11/27/15   Lelon Perla Chase, DO  cyanocobalamin (,VITAMIN B-12,) 1000 MCG/ML injection Inject 1 mL (1,000 mcg total) into the muscle every 30 (thirty) days. 06/11/14   Lelon Perla Chase, DO  lamoTRIgine (LAMICTAL) 150 MG tablet Take 150 mg by mouth every morning.    Historical Provider, MD  levothyroxine (SYNTHROID, LEVOTHROID) 75 MCG tablet TAKE 1 TABLET BY MOUTH DAILY BEFORE BREAKFAST. 08/20/15   Grayling Congress Lowne Chase, DO  lisinopril (PRINIVIL,ZESTRIL) 5 MG tablet TAKE 1 TABLET BY MOUTH ONCE DAILY 09/22/15   Grayling Congress Lowne Chase, DO  meloxicam (MOBIC) 15 MG tablet TAKE 1 TABLET (15 MG TOTAL) BY MOUTH DAILY. 10/03/15   Grayling Congress Lowne Chase, DO  modafinil (PROVIGIL) 100 MG tablet Take 1 tablet (100 mg total) by mouth daily. 10/21/15   Porfirio Mylar Dohmeier, MD  Multiple Vitamin (MULTIVITAMIN) tablet Take 1 tablet by mouth daily. Reported on 10/17/2015    Historical Provider, MD  pramipexole (MIRAPEX) 1 MG tablet Take 1 tablet by mouth at bedtime. 01/22/15   Historical Provider, MD  risperiDONE (RISPERDAL) 2 MG tablet Take 2 mg by mouth at bedtime.    Historical Provider, MD  sertraline (ZOLOFT) 100 MG tablet Take 200 mg by mouth every morning.  12/18/13   Donato Schultz, DO    Family History Family History  Problem Relation Age of Onset  . Osteoarthritis Mother   . Cancer Mother 21    breast--stage III  invasive ductal ca  . Hypertension Father   . Melanoma Father   . Depression Father     PTSD  . Cancer Father     melanoma  . Alcoholism    . Stroke      Paternal Family--9/12 children after age 19  . Anuerysm      Paternal family  . Hypertension      Paternal family  . Ovarian cancer Maternal Grandmother   . Breast cancer Maternal Grandmother   . Lung cancer Maternal Grandmother   . Prostate cancer Paternal Grandfather     Social History Social History  Substance Use Topics  . Smoking status: Never Smoker  . Smokeless tobacco: Never Used  . Alcohol use No     Allergies   Penicillins   Review of Systems Review of Systems  Respiratory: Negative for shortness of breath.   Cardiovascular: Positive for chest pain (one fleeting episode earlier today).  Gastrointestinal: Negative for diarrhea and nausea.  Neurological: Positive for dizziness.  All other systems reviewed and are negative.    Physical Exam Updated Vital Signs BP 127/80 (BP Location: Right Arm)   Pulse 60   Temp 98.3 F (36.8 C) (Oral)   Resp 16   Ht  (1.448 m)   Wt 131 lb (59.4 kg)   SpO2 100%   BMI 28.35 kg/m   Physical Exam  Constitutional: She appears well-developed and well-nourished.  HENT:  Head:  Normocephalic and atraumatic.  Neck: Normal range of motion.  Cardiovascular: Normal rate and regular rhythm.   Murmur heard.  Systolic murmur is present with a grade of 2/6  Pulmonary/Chest: No stridor. No respiratory distress.  Abdominal: She exhibits no distension.  Neurological: She is alert.  No altered mental status, able to give full seemingly accurate history.  Face is symmetric, EOM's intact but has a baseline esotropia, pupils equal and reactive, vision intact, tongue and uvula midline without deviation. Upper and Lower extremity motor 5/5, intact pain perception  in distal extremities, 2+ reflexes in biceps, patella and achilles tendons. Finger to nose normal, heel to shin normal. Walks without assistance or evident ataxia.    Nursing note and vitals reviewed.    ED Treatments / Results  Labs (all labs ordered are listed, but only abnormal results are displayed) Labs Reviewed  BASIC METABOLIC PANEL - Abnormal; Notable for the following:       Result Value   Chloride 99 (*)    All other components within normal limits  CBC WITH DIFFERENTIAL/PLATELET  URINALYSIS, ROUTINE W REFLEX MICROSCOPIC (NOT AT [facility])  TROPONIN I    EKG  EKG Interpretation  Date/Time:  Sunday November 30 2015 10:17:20 EDT Ventricular Rate:  59 PR Interval:    QRS Duration: 90 QT Interval:  405 QTC Calculation: 402 R Axis:   44 Text Interpretation:  Sinus rhythm Low voltage, extremity leads Baseline wander in lead(s) I III aVL No significant change since last tracing Confirmed by [facility] MD, Barbara Cower [phone]) on 11/30/2015 12:25:03 PM       Radiology No results found.  Procedures Procedures (including critical care time)  Medications Ordered in ED Medications - No data to display   Initial Impression / Assessment and Plan / ED Course  I have reviewed the triage vital signs and the nursing notes.  Pertinent labs & imaging results that were available during my care of the patient were reviewed  by me and considered in my medical decision making (see chart for details).  Clinical Course    Dizziness that improved while in the ED without intervention. Possible med side effect. Will hold on that until further directed by pcp/psychiatrist. Doubt cva, metabolic or infectious causes for her symptoms at this time.   Final Clinical Impressions(s) / ED Diagnoses   Final diagnoses:  Dizziness    New Prescriptions Discharge Medication List as of 11/30/2015  2:08 PM       Marily Memos, MD 11/30/15 1609

## 2015-11-30 NOTE — ED Triage Notes (Addendum)
Pt had med change Wednesday to [facility] for mood swings. Pt woke up at 7 this morning with dizziness and a brief episode of chest heaviness.  Pt was instructed to have sodium checked after several days. Pt states she had these symptoms last time her meds were changed. Pt denies pain, SOB

## 2015-12-01 ENCOUNTER — Encounter: Payer: Self-pay | Admitting: Family Medicine

## 2015-12-01 ENCOUNTER — Ambulatory Visit (INDEPENDENT_AMBULATORY_CARE_PROVIDER_SITE_OTHER): Payer: 59 | Admitting: Family Medicine

## 2015-12-01 ENCOUNTER — Telehealth: Payer: Self-pay | Admitting: *Deleted

## 2015-12-01 VITALS — BP 132/86 | HR 78 | Temp 98.5°F | Wt 130.8 lb

## 2015-12-01 DIAGNOSIS — F329 Major depressive disorder, single episode, unspecified: Secondary | ICD-10-CM

## 2015-12-01 DIAGNOSIS — F32A Depression, unspecified: Secondary | ICD-10-CM

## 2015-12-01 DIAGNOSIS — F3162 Bipolar disorder, current episode mixed, moderate: Secondary | ICD-10-CM

## 2015-12-01 DIAGNOSIS — H8110 Benign paroxysmal vertigo, unspecified ear: Secondary | ICD-10-CM

## 2015-12-01 MED ORDER — MECLIZINE HCL 25 MG PO TABS: 25.0000 mg | ORAL_TABLET | Freq: Three times a day (TID) | ORAL | 0 refills | Status: DC | PRN

## 2015-12-01 MED FILL — MECLIZINE 25 MG TABLET: 25 | 10 days supply | Qty: 30 | Fill #0

## 2015-12-01 NOTE — Assessment & Plan Note (Addendum)
Per psych meds recently changed Pt [facility] Pt extremely stressed over situation with husband Info on [facility] given to pt for emergency eval if needed Pt is not suicidal

## 2015-12-01 NOTE — Assessment & Plan Note (Signed)
Probably due to extreme stress in like right now with her relationship with her husband Meclizine given to pt for some help

## 2015-12-01 NOTE — Telephone Encounter (Signed)
[facility] note received via fax  Call:   Date: 11/30/15 Time: 0837   Caller: Self Return number: [phone]  Nurse: Thomes Lolling, RN  Chief Complaint: Dizziness   Reason for call: Caller states her Dr just put her on a Rx that has her feeling dizzy.  Related visit to physician within the last 2 weeks: Yes  Guideline: Dizziness-Lightheadedness; SEVERE dizziness (e.g., unable to stand, requires support to walk, feels like passing out now)  Disposition: Go to ED Now (or PCP triage)    **Pt seen in ED 11/30/15 as advised**

## 2015-12-01 NOTE — Progress Notes (Signed)
Patient ID: Janet Turner, female    DOB: 19-Jan-1966  Age: 50 y.o. MRN: [id number]    Subjective:  Subjective  HPI Janet Turner    w/u neg for heart or brain.-- brain ct done at [facility].  Pt was not given any meds  Her husband has been hanging around people who are drug users and has invited them to live with them --- recently they were kicked out of their trailer park because of this friend -- pt is under an extreme amount of stress.   [facility] visit from [facility] reveiwed and records from [place] needed but discussed with pt.    Review of Systems  Constitutional: Negative for appetite change, diaphoresis, fatigue and unexpected weight change.  Eyes: Negative for pain, redness and visual disturbance.  Respiratory: Negative for cough, chest tightness, shortness of breath and wheezing.   Cardiovascular: Negative for chest pain, palpitations and leg swelling.  Endocrine: Negative for cold intolerance, heat intolerance, polydipsia, polyphagia and polyuria.  Genitourinary: Negative for difficulty urinating, dysuria and frequency.  Neurological: Positive for dizziness and light-headedness. Negative for numbness and headaches.  Psychiatric/Behavioral: Positive for decreased concentration, dysphoric mood and sleep disturbance. Negative for self-injury and suicidal ideas. The patient is nervous/anxious.     History Past Medical History:  Diagnosis Date  . Amenorrhea   . Chicken pox   . Complication of anesthesia    oxygen level  and blood pressure dropped wtih anterior cervical fusion surgery   . Depression   . Family history of adverse reaction to anesthesia    mother- nausea and vomiting   . GERD (gastroesophageal reflux disease)   . Heart murmur   . Hyperlipemia   . Hypertension   . Hypoglycemia   . Hypopituitarism (HCC)   . Hypothyroidism   . Infertility, female   . Leg pain   . PTSD (post-traumatic stress disorder)   . Sleep apnea    no cpap     She  has a past surgical history that includes Eye surgery (77,29,56); Cholecystectomy (97); Cesarean section (91); Breast reduction surgery (96); Anterior cervical discectomy (2013); ganglion cyst removed; Laparoscopic gastric sleeve resection (N/A, 05/28/2014); and Upper gi endoscopy (N/A, 05/28/2014).   Her family history includes Breast cancer in her maternal grandmother; Cancer in her father; Cancer (age of onset: 15) in her mother; Depression in her father; Hypertension in her father; Lung cancer in her maternal grandmother; Melanoma in her father; Osteoarthritis in her mother; Ovarian cancer in her maternal grandmother; Prostate cancer in her paternal grandfather.She reports that she has never smoked. She has never used smokeless tobacco. She reports that she does not drink alcohol or use drugs.  Current Outpatient Prescriptions on File Prior to Visit  Medication Sig Dispense Refill  . atorvastatin (LIPITOR) 10 MG tablet Take 1 tablet (10 mg total) by mouth daily. 30 tablet 2  . cyanocobalamin (,VITAMIN B-12,) 1000 MCG/ML injection Inject 1 mL (1,000 mcg total) into the muscle every 30 (thirty) days. 1 mL 0  . lamoTRIgine (LAMICTAL) 150 MG tablet Take 300 mg by mouth every morning.     Marland Kitchen levothyroxine (SYNTHROID, LEVOTHROID) 75 MCG tablet TAKE 1 TABLET BY MOUTH DAILY BEFORE BREAKFAST. 30 tablet 5  . lisinopril (PRINIVIL,ZESTRIL) 5 MG tablet TAKE 1 TABLET BY MOUTH ONCE DAILY 30 tablet 5  . Multiple Vitamin (MULTIVITAMIN) tablet Take 1 tablet by mouth daily. Reported on 10/17/2015    . pramipexole (MIRAPEX) 1 MG tablet Take 1 tablet by mouth at  bedtime.  3  . sertraline (ZOLOFT) 100 MG tablet Take 200 mg by mouth every morning.      No current facility-administered medications on file prior to visit.      Objective:  Objective  Physical Exam  Constitutional: She is oriented to person, place, and time. She appears well-developed and well-nourished.  HENT:  Head: Normocephalic and atraumatic.    Eyes: Conjunctivae and EOM are normal.  Neck: Normal range of motion. Neck supple. No JVD present. Carotid bruit is not present. No thyromegaly present.  Cardiovascular: Normal rate, regular rhythm and normal heart sounds.   No murmur heard. Pulmonary/Chest: Effort normal and breath sounds normal. No respiratory distress. She has no wheezes. She has no rales. She exhibits no tenderness.  Musculoskeletal: She exhibits no edema.  Neurological: She is alert and oriented to person, place, and time.  Psychiatric: Judgment normal. Her mood appears anxious. Cognition and memory are normal. She exhibits a depressed mood.  Pt just saw psych last week   Nursing note and vitals reviewed.  BP 132/86   Pulse 78   Temp 98.5 F (36.9 C) (Oral)   Wt 130 lb 12.8 oz (59.3 kg)   SpO2 98%   BMI 28.30 kg/m  Wt Readings from Last 3 Encounters:  12/01/15 130 lb 12.8 oz (59.3 kg)  11/30/15 131 lb (59.4 kg)  10/21/15 127 lb (57.6 kg)     Lab Results  Component Value Date   WBC 5.9 11/30/2015   HGB 13.1 11/30/2015   HCT 39.5 11/30/2015   PLT 214 11/30/2015   GLUCOSE 87 11/30/2015   CHOL 195 09/19/2014   TRIG 99.0 09/19/2014   HDL 76.60 09/19/2014   LDLCALC 99 09/19/2014   ALT 18 09/19/2014   AST 26 09/19/2014   NA 137 11/30/2015   K 4.1 11/30/2015   CL 99 (L) 11/30/2015   CREATININE 0.78 11/30/2015   BUN 13 11/30/2015   CO2 30 11/30/2015   TSH 0.45 09/19/2014   HGBA1C 5.4 09/19/2014   MICROALBUR <0.7 09/19/2014    No results found.   Assessment & Plan:  Plan  I have discontinued Ms. Caton's meloxicam, risperiDONE, and modafinil. I am also having her start on meclizine. Additionally, I am having her maintain her sertraline, multivitamin, lamoTRIgine, cyanocobalamin, pramipexole, levothyroxine, lisinopril, and atorvastatin.  Meds ordered this encounter  Medications  . meclizine (ANTIVERT) 25 MG tablet    Sig: Take 1 tablet (25 mg total) by mouth 3 (three) times daily as needed  for dizziness.    Dispense:  30 tablet    Refill:  0    Problem List Items Addressed This Visit      Unprioritized   Benign paroxysmal positional vertigo - Primary    Probably due to extreme stress in like right now with her relationship with her husband Meclizine given to pt for some help      Relevant Medications   meclizine (ANTIVERT) 25 MG tablet   Bipolar 1 disorder, mixed, moderate (HCC)    Per psych meds recently changed Pt [facility] Pt extremely stressed over situation with husband Info on [facility] given to pt for emergency eval if needed Pt is not suicidal       Other Visit Diagnoses    Depression          Follow-up: No Follow-up on file.  Donato Schultz, DO

## 2015-12-01 NOTE — Progress Notes (Signed)
Pre visit review using our clinic review tool, if applicable. No additional management support is needed unless otherwise documented below in the visit note. 

## 2015-12-01 NOTE — Patient Instructions (Signed)
Major Depressive Disorder Major depressive disorder is a mental illness. It also may be called clinical depression or unipolar depression. Major depressive disorder usually causes feelings of sadness, hopelessness, or helplessness. Some people with this disorder do not feel particularly sad but lose interest in doing things they used to enjoy (anhedonia). Major depressive disorder also can cause physical symptoms. It can interfere with work, school, relationships, and other normal everyday activities. The disorder varies in severity but is longer lasting and more serious than the sadness we all feel from time to time in our lives. Major depressive disorder often is triggered by stressful life events or major life changes. Examples of these triggers include divorce, loss of your job or home, a move, and the death of a family member or close friend. Sometimes this disorder occurs for no obvious reason at all. People who have family members with major depressive disorder or bipolar disorder are at higher risk for developing this disorder, with or without life stressors. Major depressive disorder can occur at any age. It may occur just once in your life (single episode major depressive disorder). It may occur multiple times (recurrent major depressive disorder). SYMPTOMS People with major depressive disorder have either anhedonia or depressed mood on nearly a daily basis for at least 2 weeks or longer. Symptoms of depressed mood include:  Feelings of sadness (blue or down in the dumps) or emptiness.  Feelings of hopelessness or helplessness.  Tearfulness or episodes of crying (may be observed by others).  Irritability (children and adolescents). In addition to depressed mood or anhedonia or both, people with this disorder have at least four of the following symptoms:  Difficulty sleeping or sleeping too much.   Significant change (increase or decrease) in appetite or weight.   Lack of energy or  motivation.  Feelings of guilt and worthlessness.   Difficulty concentrating, remembering, or making decisions.  Unusually slow movement (psychomotor retardation) or restlessness (as observed by others).   Recurrent wishes for death, recurrent thoughts of self-harm (suicide), or a suicide attempt. People with major depressive disorder commonly have persistent negative thoughts about themselves, other people, and the world. People with severe major depressive disorder may experiencedistorted beliefs or perceptions about the world (psychotic delusions). They also may see or hear things that are not real (psychotic hallucinations). DIAGNOSIS Major depressive disorder is diagnosed through an assessment by your health care provider. Your health care provider will ask aboutaspects of your daily life, such as mood,sleep, and appetite, to see if you have the diagnostic symptoms of major depressive disorder. Your health care provider may ask about your medical history and use of alcohol or drugs, including prescription medicines. Your health care provider also may do a physical exam and blood work. This is because certain medical conditions and the use of certain substances can cause major depressive disorder-like symptoms (secondary depression). Your health care provider also may refer you to a mental health specialist for further evaluation and treatment. TREATMENT It is important to recognize the symptoms of major depressive disorder and seek treatment. The following treatments can be prescribed for this disorder:   Medicine. Antidepressant medicines usually are prescribed. Antidepressant medicines are thought to correct chemical imbalances in the brain that are commonly associated with major depressive disorder. Other types of medicine may be added if the symptoms do not respond to antidepressant medicines alone or if psychotic delusions or hallucinations occur.  Talk therapy. Talk therapy can be  helpful in treating major depressive disorder by providing   support, education, and guidance. Certain types of talk therapy also can help with negative thinking (cognitive behavioral therapy) and with relationship issues that trigger this disorder (interpersonal therapy). A mental health specialist can help determine which treatment is best for you. Most people with major depressive disorder do well with a combination of medicine and talk therapy. Treatments involving electrical stimulation of the brain can be used in situations with extremely severe symptoms or when medicine and talk therapy do not work over time. These treatments include electroconvulsive therapy, transcranial magnetic stimulation, and vagal nerve stimulation.   This information is not intended to replace advice given to you by your health care provider. Make sure you discuss any questions you have with your health care provider.   Document Released: 08/21/2012 Document Revised: 05/17/2014 Document Reviewed: 08/21/2012 Elsevier Interactive Patient Education 2016 [employer]. Vertigo Vertigo means you feel like you or your surroundings are moving when they are not. Vertigo can be dangerous if it occurs when you are at work, driving, or performing difficult activities.  CAUSES  Vertigo occurs when there is a conflict of signals sent to your brain from the visual and sensory systems in your body. There are many different causes of vertigo, including:  Infections, especially in the inner ear.  A bad reaction to a drug or misuse of alcohol and medicines.  Withdrawal from drugs or alcohol.  Rapidly changing positions, such as lying down or rolling over in bed.  A migraine headache.  Decreased blood flow to the brain.  Increased pressure in the brain from a head injury, infection, tumor, or bleeding. SYMPTOMS  You may feel as though the world is spinning around or you are falling to the ground. Because your balance is upset,  vertigo can cause nausea and vomiting. You may have involuntary eye movements (nystagmus). DIAGNOSIS  Vertigo is usually diagnosed by physical exam. If the cause of your vertigo is unknown, your caregiver may perform imaging tests, such as an MRI scan (magnetic resonance imaging). TREATMENT  Most cases of vertigo resolve on their own, without treatment. Depending on the cause, your caregiver may prescribe certain medicines. If your vertigo is related to body position issues, your caregiver may recommend movements or procedures to correct the problem. In rare cases, if your vertigo is caused by certain inner ear problems, you may need surgery. HOME CARE INSTRUCTIONS   Follow your caregiver's instructions.  Avoid driving.  Avoid operating heavy machinery.  Avoid performing any tasks that would be dangerous to you or others during a vertigo episode.  Tell your caregiver if you notice that certain medicines seem to be causing your vertigo. Some of the medicines used to treat vertigo episodes can actually make them worse in some people. SEEK IMMEDIATE MEDICAL CARE IF:   Your medicines do not relieve your vertigo or are making it worse.  You develop problems with talking, walking, weakness, or using your arms, hands, or legs.  You develop severe headaches.  Your nausea or vomiting continues or gets worse.  You develop visual changes.  A family member notices behavioral changes.  Your condition gets worse. MAKE SURE YOU:  Understand these instructions.  Will watch your condition.  Will get help right away if you are not doing well or get worse.   This information is not intended to replace advice given to you by your health care provider. Make sure you discuss any questions you have with your health care provider.   Document Released: 02/03/2005 Document Revised:  07/19/2011 Document Reviewed: 08/19/2014 Elsevier Interactive Patient Education [employer]

## 2015-12-03 MED FILL — LEVOTHYROXINE 75 MCG TABLET: 75 | 30 days supply | Qty: 30 | Fill #1

## 2015-12-04 ENCOUNTER — Telehealth: Payer: Self-pay | Admitting: Family Medicine

## 2015-12-04 DIAGNOSIS — F3175 Bipolar disorder, in partial remission, most recent episode depressed: Secondary | ICD-10-CM | POA: Diagnosis not present

## 2015-12-04 NOTE — Telephone Encounter (Signed)
Message left asking which medication needs to be filled.   KP
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
## 2015-12-04 NOTE — Telephone Encounter (Signed)
Please advise      KP 

## 2015-12-04 NOTE — Telephone Encounter (Signed)
Ok to call in meds but ins may not pay for it

## 2015-12-08 ENCOUNTER — Ambulatory Visit (INDEPENDENT_AMBULATORY_CARE_PROVIDER_SITE_OTHER): Payer: 59 | Admitting: Neurology

## 2015-12-08 DIAGNOSIS — G4730 Sleep apnea, unspecified: Principal | ICD-10-CM

## 2015-12-08 DIAGNOSIS — G4710 Hypersomnia, unspecified: Secondary | ICD-10-CM

## 2015-12-08 DIAGNOSIS — R0683 Snoring: Secondary | ICD-10-CM

## 2015-12-08 DIAGNOSIS — G4733 Obstructive sleep apnea (adult) (pediatric): Secondary | ICD-10-CM | POA: Diagnosis not present

## 2015-12-09 MED FILL — DIVALPROEX SOD ER 500 MG TA: 500 | 30 days supply | Qty: 60 | Fill #0

## 2015-12-09 NOTE — Telephone Encounter (Signed)
Patient said she got her med's and is ok at this time.     KP

## 2015-12-12 ENCOUNTER — Encounter: Payer: Self-pay | Admitting: Internal Medicine

## 2015-12-12 ENCOUNTER — Other Ambulatory Visit: Payer: Self-pay | Admitting: Family Medicine

## 2015-12-12 ENCOUNTER — Encounter: Payer: Self-pay | Admitting: Family Medicine

## 2015-12-12 DIAGNOSIS — E2839 Other primary ovarian failure: Secondary | ICD-10-CM

## 2015-12-12 DIAGNOSIS — Z1231 Encounter for screening mammogram for malignant neoplasm of breast: Secondary | ICD-10-CM

## 2015-12-12 DIAGNOSIS — N390 Urinary tract infection, site not specified: Secondary | ICD-10-CM

## 2015-12-15 MED FILL — LISINOPRIL 5 MG TABLET: 5 | 90 days supply | Qty: 90 | Fill #3

## 2015-12-15 NOTE — Addendum Note (Signed)
Addended by: Arnette NorrisPAYNE, Aubrei Bouchie P on: 12/15/2015 11:01 AM   Modules accepted: Orders

## 2015-12-15 NOTE — Addendum Note (Signed)
Addended by: Arnette NorrisPAYNE, Lateia Fraser P on: 12/15/2015 02:31 PM   Modules accepted: Orders

## 2015-12-16 ENCOUNTER — Encounter: Admission: RE | Payer: Self-pay | Source: Ambulatory Visit

## 2015-12-16 ENCOUNTER — Ambulatory Visit: Admission: RE | Admit: 2015-12-16 | Payer: 59 | Source: Ambulatory Visit | Admitting: Gastroenterology

## 2015-12-16 ENCOUNTER — Telehealth: Payer: Self-pay | Admitting: Family Medicine

## 2015-12-16 ENCOUNTER — Encounter: Payer: Self-pay | Admitting: Family Medicine

## 2015-12-16 ENCOUNTER — Other Ambulatory Visit (INDEPENDENT_AMBULATORY_CARE_PROVIDER_SITE_OTHER): Payer: 59

## 2015-12-16 DIAGNOSIS — N390 Urinary tract infection, site not specified: Secondary | ICD-10-CM | POA: Diagnosis not present

## 2015-12-16 LAB — POC URINALSYSI DIPSTICK (AUTOMATED)
Glucose, UA: NEGATIVE
Nitrite, UA: NEGATIVE
Spec Grav, UA: >=1.03
UROBILINOGEN UA: 0.2
pH, UA: 5.5

## 2015-12-16 SURGERY — COLONOSCOPY WITH PROPOFOL
Anesthesia: General

## 2015-12-16 NOTE — Telephone Encounter (Signed)
The order was placed yesterday.    KP

## 2015-12-16 NOTE — Telephone Encounter (Signed)
Pt says that the breast center is requesting a referral for bone density testing. Pt says that she would like to have mammogram and bone density completed on the same day if possible.    Appt - 12/30/15 at 1pm.

## 2015-12-19 ENCOUNTER — Other Ambulatory Visit: Payer: Self-pay | Admitting: Family Medicine

## 2015-12-19 DIAGNOSIS — N390 Urinary tract infection, site not specified: Secondary | ICD-10-CM

## 2015-12-19 LAB — URINE CULTURE: Colony Count: >=100000

## 2015-12-19 MED ORDER — CIPROFLOXACIN HCL 500 MG PO TABS: 500.0000 mg | ORAL_TABLET | Freq: Two times a day (BID) | ORAL | 0 refills | Status: DC

## 2015-12-19 MED FILL — CIPROFLOXACIN HCL 500 MG TA: 500 | 10 days supply | Qty: 10 | Fill #0

## 2015-12-22 ENCOUNTER — Other Ambulatory Visit: Payer: Self-pay | Admitting: Family Medicine

## 2015-12-22 DIAGNOSIS — Z1231 Encounter for screening mammogram for malignant neoplasm of breast: Secondary | ICD-10-CM

## 2015-12-22 DIAGNOSIS — Z803 Family history of malignant neoplasm of breast: Secondary | ICD-10-CM

## 2015-12-22 DIAGNOSIS — Z9889 Other specified postprocedural states: Secondary | ICD-10-CM

## 2015-12-23 ENCOUNTER — Telehealth: Payer: Self-pay

## 2015-12-23 ENCOUNTER — Ambulatory Visit: Payer: Self-pay

## 2015-12-23 DIAGNOSIS — G4733 Obstructive sleep apnea (adult) (pediatric): Secondary | ICD-10-CM

## 2015-12-23 NOTE — Telephone Encounter (Signed)
I spoke to pt and advised her that her cpap titration showed significant improvement with less respiratory events during titration. Dr. Vickey Hugerohmeier recommends that she start a cpap. Pt is agreeable to this. I advised her that I would send the order for cpap to [facility] and they will call her within a week to get it set up. Pt verbalized understanding of results. Pt had no questions at this time but was encouraged to call back if questions arise. A follow up appt was made for 02/24/2016.

## 2015-12-30 ENCOUNTER — Ambulatory Visit: Payer: Self-pay

## 2015-12-30 ENCOUNTER — Ambulatory Visit (INDEPENDENT_AMBULATORY_CARE_PROVIDER_SITE_OTHER): Payer: 59 | Admitting: Urology

## 2015-12-30 ENCOUNTER — Encounter: Payer: Self-pay | Admitting: Urology

## 2015-12-30 VITALS — BP 107/70 | HR 69 | Ht <= 58 in | Wt 131.9 lb

## 2015-12-30 DIAGNOSIS — N390 Urinary tract infection, site not specified: Secondary | ICD-10-CM

## 2015-12-30 LAB — URINALYSIS, COMPLETE
Bilirubin, UA: NEGATIVE
Glucose, UA: NEGATIVE
Ketones, UA: NEGATIVE
Leukocytes, UA: NEGATIVE
Nitrite, UA: NEGATIVE
PH UA: 7.5 (ref 5.0–7.5)
PROTEIN UA: NEGATIVE
RBC, UA: NEGATIVE
Specific Gravity, UA: >1.03 — ABNORMAL HIGH (ref 1.005–1.030)
UUROB: 1 mg/dL (ref 0.2–1.0)

## 2015-12-30 LAB — BLADDER SCAN AMB NON-IMAGING: SCAN RESULT: 45

## 2015-12-30 LAB — MICROSCOPIC EXAMINATION
Bacteria, UA: NONE SEEN
RBC, UA: NONE SEEN /hpf (ref 0–?)

## 2015-12-30 MED ORDER — NITROFURANTOIN MACROCRYSTAL 50 MG PO CAPS: 50.0000 mg | ORAL_CAPSULE | Freq: Every day | ORAL | 2 refills | Status: DC

## 2015-12-30 NOTE — Progress Notes (Signed)
12/30/2015 5:04 PM   Janet Turner 08/13/1965 [id number]  Referring provider: Donato SchultzYvonne R Lowne Chase, DO [address] [place], [place] [id number]  Chief Complaint  Patient presents with  . New Patient (Initial Visit)    Recurrent UTI    HPI: 50 year old female was seen today for further evaluation and management of recurrent urinary tract infections. The patient states that she has had recurrent infections all her life, however recently they have gotten more severe and more frequent. She is currently being treated or completing her treatment for an Escherichia coli infection. The patient states that when she symptomatic she has worsening frequency, urgency, incontinence, and burning. She denies fevers or chills.  The patient states that she feels that she empties her bladder completely. She has a strong stream. She does not have to strain to void. She does have constipation intermittently, this is recently resolved after having gastric bypass surgery. She has both urinary urge and stress incontinence. She has been taking one cranberry tablet daily for her urinary tract. She denies any history of kidney stones. She denies a history of diverticulitis. She's never had any vaginal surgeries.     PMH: Past Medical History:  Diagnosis Date  . Amenorrhea   . Chicken pox   . Complication of anesthesia    oxygen level  and blood pressure dropped wtih anterior cervical fusion surgery   . Depression   . Family history of adverse reaction to anesthesia    mother- nausea and vomiting   . GERD (gastroesophageal reflux disease)   . Heart murmur   . Hyperlipemia   . Hypertension   . Hypoglycemia   . Hypopituitarism (HCC)   . Hypothyroidism   . Infertility, female   . Leg pain   . PTSD (post-traumatic stress disorder)   . Sleep apnea    no cpap     Surgical History: Past Surgical History:  Procedure Laterality Date  . ANTERIOR CERVICAL DISCECTOMY  2013   Ant  Cervical Fusion C4-5 with diskectomy  . BREAST REDUCTION SURGERY  96  . CESAREAN SECTION  91  . CHOLECYSTECTOMY  97  . EYE SURGERY  [phone],82   x's 3   . ganglion cyst removed    . LAPAROSCOPIC GASTRIC SLEEVE RESECTION N/A 05/28/2014   Procedure: LAPAROSCOPIC GASTRIC SLEEVE RESECTION;  Surgeon: Valarie MerinoMatthew B Martin, MD;  Location: [facility];  Service: General;  Laterality: N/A;  . UPPER GI ENDOSCOPY N/A 05/28/2014   Procedure: UPPER GI ENDOSCOPY;  Surgeon: Valarie MerinoMatthew B Martin, MD;  Location: [facility];  Service: General;  Laterality: N/A;    Home Medications:    Medication List       Accurate as of 12/30/15  5:04 PM. Always use your most recent med list.          atorvastatin 10 MG tablet Commonly known as:  LIPITOR Take 1 tablet (10 mg total) by mouth daily.   ciprofloxacin 500 MG tablet Commonly known as:  CIPRO Take 1 tablet (500 mg total) by mouth 2 (two) times daily.   cyanocobalamin 1000 MCG/ML injection Commonly known as:  (VITAMIN B-12) Inject 1 mL (1,000 mcg total) into the muscle every 30 (thirty) days.   lamoTRIgine 150 MG tablet Commonly known as:  LAMICTAL Take 300 mg by mouth every morning.   levothyroxine 75 MCG tablet Commonly known as:  SYNTHROID, LEVOTHROID TAKE 1 TABLET BY MOUTH DAILY BEFORE BREAKFAST.   lisinopril 5 MG tablet Commonly known as:  PRINIVIL,ZESTRIL TAKE 1 TABLET BY MOUTH ONCE DAILY   meclizine 25 MG tablet Commonly known as:  ANTIVERT Take 1 tablet (25 mg total) by mouth 3 (three) times daily as needed for dizziness.   multivitamin tablet Take 1 tablet by mouth daily. Reported on 10/17/2015   nitrofurantoin 50 MG capsule Commonly known as:  MACRODANTIN Take 1 capsule (50 mg total) by mouth at bedtime.   pramipexole 1 MG tablet Commonly known as:  MIRAPEX Take 1 tablet by mouth at bedtime.   sertraline 100 MG tablet Commonly known as:  ZOLOFT Take 200 mg by mouth every morning.       Allergies:  Allergies  Allergen Reactions  .  Penicillins Hives and Itching    Family History: Family History  Problem Relation Age of Onset  . Osteoarthritis Mother   . Cancer Mother 1069    breast--stage III  invasive ductal ca  . Hypertension Father   . Melanoma Father   . Depression Father     PTSD  . Cancer Father     melanoma  . Alcoholism    . Stroke      Paternal Family--9/12 children after age 51  . Anuerysm      Paternal family  . Hypertension      Paternal family  . Ovarian cancer Maternal Grandmother   . Breast cancer Maternal Grandmother   . Lung cancer Maternal Grandmother   . Prostate cancer Paternal Grandfather     Social History:  reports that she has never smoked. She has never used smokeless tobacco. She reports that she does not drink alcohol or use drugs.  ROS: UROLOGY Frequent Urination?: Yes Hard to postpone urination?: Yes Burning/pain with urination?: Yes Get up at night to urinate?: Yes Leakage of urine?: Yes Urine stream starts and stops?: Yes Trouble starting stream?: Yes Do you have to strain to urinate?: No Blood in urine?: No Urinary tract infection?: Yes Sexually transmitted disease?: No Injury to kidneys or bladder?: No Painful intercourse?: No Weak stream?: Yes Currently pregnant?: No Vaginal bleeding?: No Last menstrual period?: No  Gastrointestinal Nausea?: No Vomiting?: No Indigestion/heartburn?: No Diarrhea?: No Constipation?: No  Constitutional Fever: No Night sweats?: No Weight loss?: No Fatigue?: Yes  Skin Skin rash/lesions?: No Itching?: No  Eyes Blurred vision?: No Double vision?: No  Ears/Nose/Throat Sore throat?: No Sinus problems?: No  Hematologic/Lymphatic Swollen glands?: No Easy bruising?: No  Cardiovascular Leg swelling?: No Chest pain?: No  Respiratory Cough?: No Shortness of breath?: No  Endocrine Excessive thirst?: No  Musculoskeletal Back pain?: No Joint pain?: No  Neurological Headaches?: No Dizziness?:  No  Psychologic Depression?: Yes Anxiety?: Yes  Physical Exam: BP 107/70   Pulse 69   Ht 4\' 9"  (1.448 m)   Wt 131 lb 14.4 oz (59.8 kg)   BMI 28.54 kg/m   Constitutional:  Alert and oriented, No acute distress. HEENT: [place] AT, moist mucus membranes.  Trachea midline, no masses. Cardiovascular: No clubbing, cyanosis, or edema. Respiratory: Normal respiratory effort, no increased work of breathing. GI: Abdomen is soft, nontender, nondistended, no abdominal masses GU: No CVA tenderness. The patient has an atrophic vagina with no evidence of cystocele or rectocele, good vaginal support. She has no suprapubic tenderness or peri-vaginal wall tenderness. Skin: No rashes, bruises or suspicious lesions. Lymph: No cervical or inguinal adenopathy. Neurologic: Grossly intact, no focal deficits, moving all 4 extremities. Psychiatric: Normal mood and affect.  Laboratory Data: Lab Results  Component Value Date   WBC 5.9  11/30/2015   HGB 13.1 11/30/2015   HCT 39.5 11/30/2015   MCV 90.0 11/30/2015   PLT 214 11/30/2015    Lab Results  Component Value Date   CREATININE 0.78 11/30/2015    No results found for: PSA  Lab Results  Component Value Date   TESTOSTERONE 23 01/15/2015    Lab Results  Component Value Date   HGBA1C 5.4 09/19/2014    Urinalysis    Component Value Date/Time   COLORURINE YELLOW 11/30/2015 1148   APPEARANCEUR Hazy (A) 12/30/2015 1531   LABSPEC 1.012 11/30/2015 1148   PHURINE 8.0 11/30/2015 1148   GLUCOSEU Negative 12/30/2015 1531   HGBUR NEGATIVE 11/30/2015 1148   BILIRUBINUR Negative 12/30/2015 1531   KETONESUR NEGATIVE 11/30/2015 1148   PROTEINUR Negative 12/30/2015 1531   PROTEINUR NEGATIVE 11/30/2015 1148   UROBILINOGEN 0.2 12/16/2015 1708   UROBILINOGEN 0.2 11/27/2013 1707   NITRITE Negative 12/30/2015 1531   NITRITE NEGATIVE 11/30/2015 1148   LEUKOCYTESUR Negative 12/30/2015 1531    Pertinent Imaging: Patient has a CT scan from 2014 which I  have independently reviewed which demonstrating no evidence of kidney stones.  Assessment & Plan:  The patient has recurrent urinary tract infections.  1. Recurrent UTI For the patient's infections are recommended multimodality therapy. We discussed W cranberry tablets 2 tablets twice daily. In addition, recommended the patient take a lactobacillus probiotic daily. The patient would also like to consider a suppressive antibiotic for the next 90 days and I recommended Macrobid 50 mg daily at bedtime. In addition, I recommended that the patient maintain a bowel regimen so that she is having 1 soft daily bowel movement. For the patient's incontinence are recommended that she work on [employer]. We have also provided a month's supply of myrbetriq 50 mg daily to see if this has any impact on her symptoms. If so, we'll plan to refill it. And finally, I have given the patient a sample of a shooting cream which she can use to help with her vaginal atrophy which may or may not impact her recurrent urinary tract infections. We'll plan to follow the patient in 3 months. - Urinalysis, Complete - Bladder Scan (Post Void Residual) in office   Return in about 3 months (around 03/31/2016).  Crist Fat, MD  [facility] [address] [place], [place] [phone]

## 2015-12-30 NOTE — Progress Notes (Signed)
bl

## 2016-01-02 DIAGNOSIS — F3175 Bipolar disorder, in partial remission, most recent episode depressed: Secondary | ICD-10-CM | POA: Diagnosis not present

## 2016-01-06 ENCOUNTER — Ambulatory Visit (INDEPENDENT_AMBULATORY_CARE_PROVIDER_SITE_OTHER): Payer: 59 | Admitting: Licensed Clinical Social Worker

## 2016-01-06 DIAGNOSIS — F3112 Bipolar disorder, current episode manic without psychotic features, moderate: Secondary | ICD-10-CM | POA: Diagnosis not present

## 2016-01-09 NOTE — Telephone Encounter (Signed)
Please contact [facility] about refurbished CPAPs or payment plans. CD

## 2016-01-09 NOTE — Telephone Encounter (Signed)
Pt c/a appt on 10/17. She advised she does not have the CPAP due to low funds. She said she would call back to r/s when she could get the machine. She did ask for [facility] phone number

## 2016-01-13 ENCOUNTER — Other Ambulatory Visit: Payer: Self-pay | Admitting: Family Medicine

## 2016-01-13 ENCOUNTER — Ambulatory Visit: Payer: Self-pay

## 2016-01-13 MED FILL — lamoTRIgine 150 MG TABS: 150 | 30 days supply | Qty: 60 | Fill #1

## 2016-01-13 MED FILL — LEVOTHYROXINE 75 MCG TABLET: 75 | 30 days supply | Qty: 30 | Fill #2

## 2016-01-13 MED FILL — PRAMIPEXOLE 1 MG TABLET: 1 | 90 days supply | Qty: 90 | Fill #0 | Status: TO

## 2016-01-13 NOTE — Telephone Encounter (Signed)
Last seen 12/01/15, Rx never filled here. Please advise    KP

## 2016-01-13 NOTE — Telephone Encounter (Signed)
[facility] with [facility], this is what she said:  In regard to [facility]ynson Turner,  I spoke with her on Friday and she explained that she would call to re schedule soon after she figures out financials.  I will see what I can do for the day of set up payment, that is the one she is worried about.  I will check into that and Call her today to see if I can get it worked out to get her scheduled sooner.  Please let me know if there is anything else I can do :)   Thank you so much!  Janet FilbertHeather Turner, CSR [employer] [employer] [address] [place], [place] [address]  Phone-  [phone] Fax-  [phone] Email-  [email or web address]

## 2016-01-13 NOTE — Telephone Encounter (Signed)
[facility] asking for help.

## 2016-01-15 MED FILL — SERTRALINE HCL 100 MG TAB: 100 | 30 days supply | Qty: 45 | Fill #0

## 2016-01-20 ENCOUNTER — Encounter: Payer: Self-pay | Admitting: Family Medicine

## 2016-01-22 ENCOUNTER — Ambulatory Visit: Payer: 59 | Admitting: Obstetrics and Gynecology

## 2016-01-27 ENCOUNTER — Ambulatory Visit: Payer: 59 | Admitting: Obstetrics and Gynecology

## 2016-01-27 ENCOUNTER — Ambulatory Visit: Payer: 59 | Admitting: Nurse Practitioner

## 2016-02-16 ENCOUNTER — Other Ambulatory Visit: Payer: Self-pay

## 2016-02-16 ENCOUNTER — Encounter: Payer: Self-pay | Admitting: Family Medicine

## 2016-02-16 DIAGNOSIS — B360 Pityriasis versicolor: Secondary | ICD-10-CM

## 2016-02-16 DIAGNOSIS — Z0000 Encounter for general adult medical examination without abnormal findings: Secondary | ICD-10-CM

## 2016-02-16 MED ORDER — RANITIDINE HCL 300 MG PO CAPS: 300.0000 mg | ORAL_CAPSULE | Freq: Every evening | ORAL | 3 refills | Status: DC

## 2016-02-16 MED FILL — raNITIdine HCL 300 MG TABS: 300 | 90 days supply | Qty: 90 | Fill #0 | Status: TO

## 2016-02-16 NOTE — Telephone Encounter (Signed)
Please advise if she would need to be seen or if something could be sent in she was seen in July.  KP

## 2016-02-17 ENCOUNTER — Encounter: Payer: Self-pay | Admitting: Family Medicine

## 2016-02-17 DIAGNOSIS — F3175 Bipolar disorder, in partial remission, most recent episode depressed: Secondary | ICD-10-CM | POA: Diagnosis not present

## 2016-02-17 MED FILL — lamoTRIgine 150 MG TABS: 150 | 30 days supply | Qty: 60 | Fill #0

## 2016-02-17 MED FILL — LEVOTHYROXINE 75 MCG TABLET: 75 | 30 days supply | Qty: 30 | Fill #3

## 2016-02-18 MED FILL — SERTRALINE HCL 100 MG TAB: 100 | 30 days supply | Qty: 45 | Fill #0 | Status: TO

## 2016-02-18 NOTE — Telephone Encounter (Signed)
She would need to either schedule an appointment or do an e-visit. I will not treat without having seen her. She can also wait for her PCPs input if she would like.

## 2016-02-20 DIAGNOSIS — R001 Bradycardia, unspecified: Secondary | ICD-10-CM | POA: Diagnosis not present

## 2016-02-20 DIAGNOSIS — H5022 Vertical strabismus, left eye: Secondary | ICD-10-CM | POA: Diagnosis not present

## 2016-02-20 DIAGNOSIS — H547 Unspecified visual loss: Secondary | ICD-10-CM | POA: Diagnosis not present

## 2016-02-20 DIAGNOSIS — H509 Unspecified strabismus: Secondary | ICD-10-CM | POA: Diagnosis not present

## 2016-02-20 DIAGNOSIS — H02402 Unspecified ptosis of left eyelid: Secondary | ICD-10-CM | POA: Diagnosis not present

## 2016-02-20 DIAGNOSIS — M549 Dorsalgia, unspecified: Secondary | ICD-10-CM | POA: Diagnosis not present

## 2016-02-20 DIAGNOSIS — H5581 Saccadic eye movements: Secondary | ICD-10-CM | POA: Diagnosis not present

## 2016-02-20 DIAGNOSIS — R258 Other abnormal involuntary movements: Secondary | ICD-10-CM | POA: Diagnosis not present

## 2016-02-20 DIAGNOSIS — H5000 Unspecified esotropia: Secondary | ICD-10-CM | POA: Diagnosis not present

## 2016-02-20 DIAGNOSIS — R200 Anesthesia of skin: Secondary | ICD-10-CM | POA: Diagnosis not present

## 2016-02-20 DIAGNOSIS — M79662 Pain in left lower leg: Secondary | ICD-10-CM | POA: Diagnosis not present

## 2016-02-20 DIAGNOSIS — K219 Gastro-esophageal reflux disease without esophagitis: Secondary | ICD-10-CM | POA: Diagnosis not present

## 2016-02-20 DIAGNOSIS — M7989 Other specified soft tissue disorders: Secondary | ICD-10-CM | POA: Diagnosis not present

## 2016-02-20 DIAGNOSIS — R2990 Unspecified symptoms and signs involving the nervous system: Secondary | ICD-10-CM | POA: Diagnosis not present

## 2016-02-20 DIAGNOSIS — I10 Essential (primary) hypertension: Secondary | ICD-10-CM | POA: Diagnosis not present

## 2016-02-20 DIAGNOSIS — R29898 Other symptoms and signs involving the musculoskeletal system: Secondary | ICD-10-CM | POA: Diagnosis not present

## 2016-02-21 DIAGNOSIS — M7989 Other specified soft tissue disorders: Secondary | ICD-10-CM | POA: Diagnosis not present

## 2016-02-21 DIAGNOSIS — R531 Weakness: Secondary | ICD-10-CM | POA: Diagnosis not present

## 2016-02-21 DIAGNOSIS — M79662 Pain in left lower leg: Secondary | ICD-10-CM | POA: Diagnosis not present

## 2016-02-21 DIAGNOSIS — H509 Unspecified strabismus: Secondary | ICD-10-CM | POA: Diagnosis not present

## 2016-02-21 DIAGNOSIS — M549 Dorsalgia, unspecified: Secondary | ICD-10-CM | POA: Diagnosis not present

## 2016-02-21 DIAGNOSIS — H547 Unspecified visual loss: Secondary | ICD-10-CM | POA: Diagnosis not present

## 2016-02-21 DIAGNOSIS — I10 Essential (primary) hypertension: Secondary | ICD-10-CM | POA: Diagnosis not present

## 2016-02-21 DIAGNOSIS — R001 Bradycardia, unspecified: Secondary | ICD-10-CM | POA: Diagnosis not present

## 2016-02-21 DIAGNOSIS — K219 Gastro-esophageal reflux disease without esophagitis: Secondary | ICD-10-CM | POA: Diagnosis not present

## 2016-02-21 DIAGNOSIS — H5462 Unqualified visual loss, left eye, normal vision right eye: Secondary | ICD-10-CM | POA: Diagnosis not present

## 2016-02-21 DIAGNOSIS — R200 Anesthesia of skin: Secondary | ICD-10-CM | POA: Diagnosis not present

## 2016-02-21 DIAGNOSIS — H02402 Unspecified ptosis of left eyelid: Secondary | ICD-10-CM | POA: Diagnosis not present

## 2016-02-22 DIAGNOSIS — R200 Anesthesia of skin: Secondary | ICD-10-CM | POA: Diagnosis not present

## 2016-02-22 DIAGNOSIS — R609 Edema, unspecified: Secondary | ICD-10-CM | POA: Diagnosis not present

## 2016-02-22 DIAGNOSIS — H539 Unspecified visual disturbance: Secondary | ICD-10-CM | POA: Diagnosis not present

## 2016-02-22 DIAGNOSIS — R9431 Abnormal electrocardiogram [ECG] [EKG]: Secondary | ICD-10-CM | POA: Diagnosis not present

## 2016-02-22 DIAGNOSIS — M549 Dorsalgia, unspecified: Secondary | ICD-10-CM | POA: Diagnosis not present

## 2016-02-22 DIAGNOSIS — I10 Essential (primary) hypertension: Secondary | ICD-10-CM | POA: Diagnosis not present

## 2016-02-22 DIAGNOSIS — H509 Unspecified strabismus: Secondary | ICD-10-CM | POA: Diagnosis not present

## 2016-02-22 DIAGNOSIS — H4712 Papilledema associated with decreased ocular pressure: Secondary | ICD-10-CM | POA: Diagnosis not present

## 2016-02-22 DIAGNOSIS — R001 Bradycardia, unspecified: Secondary | ICD-10-CM | POA: Diagnosis not present

## 2016-02-22 DIAGNOSIS — H02402 Unspecified ptosis of left eyelid: Secondary | ICD-10-CM | POA: Diagnosis not present

## 2016-02-22 DIAGNOSIS — M79662 Pain in left lower leg: Secondary | ICD-10-CM | POA: Diagnosis not present

## 2016-02-22 DIAGNOSIS — K219 Gastro-esophageal reflux disease without esophagitis: Secondary | ICD-10-CM | POA: Diagnosis not present

## 2016-02-22 DIAGNOSIS — M7989 Other specified soft tissue disorders: Secondary | ICD-10-CM | POA: Diagnosis not present

## 2016-02-23 DIAGNOSIS — H5089 Other specified strabismus: Secondary | ICD-10-CM | POA: Diagnosis not present

## 2016-02-23 DIAGNOSIS — M549 Dorsalgia, unspecified: Secondary | ICD-10-CM | POA: Diagnosis not present

## 2016-02-23 DIAGNOSIS — K219 Gastro-esophageal reflux disease without esophagitis: Secondary | ICD-10-CM | POA: Diagnosis not present

## 2016-02-23 DIAGNOSIS — H509 Unspecified strabismus: Secondary | ICD-10-CM | POA: Diagnosis not present

## 2016-02-23 DIAGNOSIS — I10 Essential (primary) hypertension: Secondary | ICD-10-CM | POA: Diagnosis not present

## 2016-02-23 DIAGNOSIS — M7989 Other specified soft tissue disorders: Secondary | ICD-10-CM | POA: Diagnosis not present

## 2016-02-23 DIAGNOSIS — M79662 Pain in left lower leg: Secondary | ICD-10-CM | POA: Diagnosis not present

## 2016-02-23 DIAGNOSIS — H5712 Ocular pain, left eye: Secondary | ICD-10-CM | POA: Diagnosis not present

## 2016-02-23 DIAGNOSIS — H5000 Unspecified esotropia: Secondary | ICD-10-CM | POA: Diagnosis not present

## 2016-02-23 DIAGNOSIS — R200 Anesthesia of skin: Secondary | ICD-10-CM | POA: Diagnosis not present

## 2016-02-23 DIAGNOSIS — R001 Bradycardia, unspecified: Secondary | ICD-10-CM | POA: Diagnosis not present

## 2016-02-23 DIAGNOSIS — H02402 Unspecified ptosis of left eyelid: Secondary | ICD-10-CM | POA: Diagnosis not present

## 2016-02-24 ENCOUNTER — Ambulatory Visit (INDEPENDENT_AMBULATORY_CARE_PROVIDER_SITE_OTHER): Payer: 59 | Admitting: Internal Medicine

## 2016-02-24 ENCOUNTER — Ambulatory Visit: Payer: Self-pay | Admitting: Neurology

## 2016-02-24 VITALS — BP 184/90 | HR 75 | Temp 97.6°F | Resp 16 | Ht <= 58 in | Wt 139.6 lb

## 2016-02-24 DIAGNOSIS — E538 Deficiency of other specified B group vitamins: Secondary | ICD-10-CM

## 2016-02-24 DIAGNOSIS — I10 Essential (primary) hypertension: Secondary | ICD-10-CM | POA: Diagnosis not present

## 2016-02-24 DIAGNOSIS — E038 Other specified hypothyroidism: Secondary | ICD-10-CM

## 2016-02-24 DIAGNOSIS — G43011 Migraine without aura, intractable, with status migrainosus: Secondary | ICD-10-CM | POA: Diagnosis not present

## 2016-02-24 MED ORDER — PROMETHAZINE HCL 12.5 MG PO TABS: 12.5000 mg | ORAL_TABLET | Freq: Four times a day (QID) | ORAL | 0 refills | Status: DC | PRN

## 2016-02-24 MED ORDER — SUMATRIPTAN-NAPROXEN SODIUM 85-500 MG PO TABS: 1.0000 | ORAL_TABLET | ORAL | 2 refills | Status: DC | PRN

## 2016-02-24 MED ORDER — NEBIVOLOL HCL 5 MG PO TABS: 5.0000 mg | ORAL_TABLET | Freq: Every day | ORAL | 0 refills | Status: DC

## 2016-02-24 MED FILL — QUETIAPINE FUMARATE 100 MG: 100 | 30 days supply | Qty: 30 | Fill #0 | Status: TO

## 2016-02-24 MED FILL — PROMETHAZINE 12.5 MG TABLET: 12.5 | 7 days supply | Qty: 30 | Fill #0

## 2016-02-24 NOTE — Progress Notes (Signed)
Subjective:  Patient ID: Janet Turner, female    DOB: [birth year]  Age: 50 y.o. MRN: [id number]  CC: Hypertension and Headache  NEW TO ME  HPI Janet Turner presents for a work in appointment for a 5 day history of headache with nausea, vomiting, and photophobia. She has a chronically lazy eye on the right side and thought her symptoms might be related to that so she went to [facility] about 5 days ago and saw her eye doctor. He was concerned that she was having a neurological event so she was transferred to the emergency room and eventually admitted. She had an MRI done of her brain which was not a good study because of artifact from her braces but what they did see was within normal limits. She went on to have a CT scan done of her brain that was within normal limits. She doesn't think she was given anything for the headache while she was admitted. She said they made multiple attempts to have a lumbar puncture done but were unable to do it.  She describes the headache as a throbbing sensation behind both eyes. She doesn't think she's ever had a headache like this before. She denies numbness, weakness, tingling in her extremities and his had no changes in her vision or hearing.  Outpatient Medications Prior to Visit  Medication Sig Dispense Refill  . atorvastatin (LIPITOR) 10 MG tablet Take 1 tablet (10 mg total) by mouth daily. 30 tablet 2  . lamoTRIgine (LAMICTAL) 150 MG tablet Take 300 mg by mouth every morning.     Marland Kitchen levothyroxine (SYNTHROID, LEVOTHROID) 75 MCG tablet TAKE 1 TABLET BY MOUTH DAILY BEFORE BREAKFAST. 30 tablet 5  . pramipexole (MIRAPEX) 1 MG tablet TAKE 1 TABLET BY MOUTH ONCE DAILY 90 tablet 1  . ranitidine (ZANTAC) 300 MG capsule Take 1 capsule (300 mg total) by mouth every evening. 90 capsule 3  . sertraline (ZOLOFT) 100 MG tablet Take 200 mg by mouth every morning.     . Multiple Vitamin (MULTIVITAMIN) tablet Take 1 tablet by mouth daily.  Reported on 10/17/2015    . cyanocobalamin (,VITAMIN B-12,) 1000 MCG/ML injection Inject 1 mL (1,000 mcg total) into the muscle every 30 (thirty) days. (Patient not taking: Reported on 02/24/2016) 1 mL 0  . lisinopril (PRINIVIL,ZESTRIL) 5 MG tablet TAKE 1 TABLET BY MOUTH ONCE DAILY (Patient not taking: Reported on 02/24/2016) 30 tablet 5  . meclizine (ANTIVERT) 25 MG tablet Take 1 tablet (25 mg total) by mouth 3 (three) times daily as needed for dizziness. (Patient not taking: Reported on 02/24/2016) 30 tablet 0  . ciprofloxacin (CIPRO) 500 MG tablet Take 1 tablet (500 mg total) by mouth 2 (two) times daily. (Patient not taking: Reported on 12/30/2015) 10 tablet 0  . nitrofurantoin (MACRODANTIN) 50 MG capsule Take 1 capsule (50 mg total) by mouth at bedtime. 90 capsule 2   No facility-administered medications prior to visit.     ROS Review of Systems  Constitutional: Negative for chills, diaphoresis, fatigue and fever.  HENT: Negative for drooling, facial swelling, sinus pressure, sore throat and tinnitus.   Eyes: Positive for photophobia. Negative for pain, discharge, redness, itching and visual disturbance.  Respiratory: Negative for cough, choking, chest tightness, shortness of breath and stridor.   Cardiovascular: Negative for chest pain, palpitations and leg swelling.  Gastrointestinal: Positive for nausea and vomiting. Negative for abdominal distention, abdominal pain, anal bleeding, blood in stool, constipation and diarrhea.  Endocrine: Negative.  Genitourinary: Negative.   Musculoskeletal: Negative.  Negative for arthralgias, gait problem, joint swelling, myalgias and neck stiffness.  Skin: Negative.  Negative for color change and rash.  Allergic/Immunologic: Negative.   Neurological: Positive for headaches. Negative for dizziness, tremors, seizures, syncope, facial asymmetry, speech difficulty, weakness, light-headedness and numbness.       She complains of a 5 day history of pounding  behind both eyes with nausea, vomiting, and photophobia.  Hematological: Negative.  Negative for adenopathy. Does not bruise/bleed easily.  Psychiatric/Behavioral: Negative.  Negative for decreased concentration, dysphoric mood and sleep disturbance. The patient is not nervous/anxious.     Objective:  BP (!) 184/90 (BP Location: Left Arm, Patient Position: Sitting, Cuff Size: Normal)   Pulse 75   Temp 97.6 F (36.4 C) (Oral)   Resp 16   Ht 4\' 9"  (1.448 m)   Wt 139 lb 10 oz (63.3 kg)   SpO2 96%   BMI 30.21 kg/m   BP Readings from Last 3 Encounters:  02/24/16 (!) 184/90  12/30/15 107/70  12/01/15 132/86    Wt Readings from Last 3 Encounters:  02/24/16 139 lb 10 oz (63.3 kg)  12/30/15 131 lb 14.4 oz (59.8 kg)  12/01/15 130 lb 12.8 oz (59.3 kg)    Physical Exam  Constitutional: She is oriented to person, place, and time. She appears well-developed and well-nourished.  Non-toxic appearance. She does not have a sickly appearance. She does not appear ill. No distress.  HENT:  Head: Normocephalic and atraumatic.  Mouth/Throat: Oropharynx is clear and moist. No oropharyngeal exudate.  Eyes: Conjunctivae are normal. Pupils are equal, round, and reactive to light. Right eye exhibits no discharge. Left eye exhibits no discharge. Right conjunctiva is not injected. Right conjunctiva has no hemorrhage. Left conjunctiva is not injected. Left conjunctiva has no hemorrhage. No scleral icterus. Right eye exhibits abnormal extraocular motion. Right eye exhibits no nystagmus. Left eye exhibits normal extraocular motion and no nystagmus.  Right eye does not fully deviate laterally  Neck: Normal range of motion. Neck supple. No JVD present. No neck rigidity. No tracheal deviation present. No Brudzinski's sign and no Kernig's sign noted. No thyromegaly present.  Cardiovascular: Normal rate, regular rhythm, S1 normal, S2 normal and intact distal pulses.  Exam reveals no gallop and no friction rub.     Murmur heard.  Systolic murmur is present with a grade of 1/6   No diastolic murmur is present  Pulses:      Carotid pulses are 1+ on the right side, and 1+ on the left side.      Radial pulses are 1+ on the right side, and 1+ on the left side.       Femoral pulses are 1+ on the right side, and 1+ on the left side.      Popliteal pulses are 1+ on the right side, and 1+ on the left side.       Dorsalis pedis pulses are 1+ on the right side, and 1+ on the left side.       Posterior tibial pulses are 1+ on the right side, and 1+ on the left side.  Soft 1/6 SEM over LLSB  Pulmonary/Chest: Effort normal and breath sounds normal. No stridor. No respiratory distress. She has no wheezes. She has no rales. She exhibits no tenderness.  Abdominal: Soft. Bowel sounds are normal. She exhibits no distension and no mass. There is no tenderness. There is no rebound and no guarding.  Musculoskeletal: Normal range of  motion. She exhibits no edema, tenderness or deformity.  Lymphadenopathy:    She has no cervical adenopathy.  Neurological: She is alert and oriented to person, place, and time. She displays no atrophy, no tremor and normal reflexes. No cranial nerve deficit or sensory deficit. She exhibits normal muscle tone. She displays a negative Romberg sign. She displays no seizure activity. Coordination and gait normal. She displays no Babinski's sign on the right side. She displays no Babinski's sign on the left side.  Reflex Scores:      Tricep reflexes are 1+ on the right side and 1+ on the left side.      Bicep reflexes are 1+ on the right side and 1+ on the left side.      Brachioradialis reflexes are 1+ on the right side and 1+ on the left side.      Patellar reflexes are 1+ on the right side and 1+ on the left side.      Achilles reflexes are 1+ on the right side and 1+ on the left side. Skin: Skin is warm and dry. No rash noted. She is not diaphoretic. No erythema. No pallor.  Psychiatric: She has  a normal mood and affect. Her behavior is normal. Judgment and thought content normal.  Vitals reviewed.   Lab Results  Component Value Date   WBC 5.9 11/30/2015   HGB 13.1 11/30/2015   HCT 39.5 11/30/2015   PLT 214 11/30/2015   GLUCOSE 87 11/30/2015   CHOL 195 09/19/2014   TRIG 99.0 09/19/2014   HDL 76.60 09/19/2014   LDLCALC 99 09/19/2014   ALT 18 09/19/2014   AST 26 09/19/2014   NA 137 11/30/2015   K 4.1 11/30/2015   CL 99 (L) 11/30/2015   CREATININE 0.78 11/30/2015   BUN 13 11/30/2015   CO2 30 11/30/2015   TSH 0.45 09/19/2014   HGBA1C 5.4 09/19/2014   MICROALBUR <0.7 09/19/2014    No results found.  Assessment & Plan:   Janet MaxwellCheryl was seen today for hypertension and headache.  Diagnoses and all orders for this visit:  B12 deficiency- [facility] that she was mildly anemic so I will recheck her H/H and will monitor her folate and B12 levels. -     CBC with Differential/Platelet; Future -     Folate; Future -     Vitamin B12; Future  Other specified hypothyroidism- she is due for a TSH level to see if her Synthroid dose is accurate -     TSH; Future  Essential hypertension- her blood pressure is not well controlled, this may be contributing to her headache. I will add nebivolol to the ACE inhibitor as this will help control her blood pressure and may also help treat the migraine headaches. -     Basic metabolic panel; Future  Intractable migraine without aura and with status migrainosus- she was given a dose of Treximet in the office and within 30 minutes told me that her headache was significantly improving. She has had a rather thorough workup and I do not see any neurological deficits at this time so we'll continue to treat her for migraine headache with Treximet as needed and Phenergan as needed for nausea and vomiting. Will also start nebivolol for blood pressure control and prophylaxis against another migraine headache. -      Sedimentation rate; Future -     SUMAtriptan-naproxen (TREXIMET) 85-500 MG tablet; Take 1 tablet by mouth every 2 (two) hours as  needed for migraine. -     promethazine (PHENERGAN) 12.5 MG tablet; Take 1 tablet (12.5 mg total) by mouth every 6 (six) hours as needed for nausea or vomiting.   I have discontinued Ms. Cipriani's multivitamin, ciprofloxacin, and nitrofurantoin. I am also having her start on SUMAtriptan-naproxen, promethazine, and nebivolol. Additionally, I am having her maintain her sertraline, lamoTRIgine, cyanocobalamin, levothyroxine, lisinopril, atorvastatin, meclizine, pramipexole, and ranitidine.  Meds ordered this encounter  Medications  . SUMAtriptan-naproxen (TREXIMET) 85-500 MG tablet    Sig: Take 1 tablet by mouth every 2 (two) hours as needed for migraine.    Dispense:  10 tablet    Refill:  2  . promethazine (PHENERGAN) 12.5 MG tablet    Sig: Take 1 tablet (12.5 mg total) by mouth every 6 (six) hours as needed for nausea or vomiting.    Dispense:  30 tablet    Refill:  0  . nebivolol (BYSTOLIC) 5 MG tablet    Sig: Take 1 tablet (5 mg total) by mouth daily.    Dispense:  28 tablet    Refill:  0     Follow-up: Return in about 1 week (around 03/02/2016).  Sanda Linger, MD

## 2016-02-24 NOTE — Progress Notes (Signed)
Pre visit review using our clinic review tool, if applicable. No additional management support is needed unless otherwise documented below in the visit note. 

## 2016-02-24 NOTE — Patient Instructions (Signed)
Hypertension Hypertension, commonly called high blood pressure, is when the force of blood pumping through your arteries is too strong. Your arteries are the blood vessels that carry blood from your heart throughout your body. A blood pressure reading consists of a higher number over a lower number, such as 110/72. The higher number (systolic) is the pressure inside your arteries when your heart pumps. The lower number (diastolic) is the pressure inside your arteries when your heart relaxes. Ideally you want your blood pressure below 120/80. Hypertension forces your heart to work harder to pump blood. Your arteries may become narrow or stiff. Having untreated or uncontrolled hypertension can cause heart attack, stroke, kidney disease, and other problems. RISK FACTORS Some risk factors for high blood pressure are controllable. Others are not.  Risk factors you cannot control include:   Race. You may be at higher risk if you are African American.  Age. Risk increases with age.  Gender. Men are at higher risk than women before age 45 years. After age 65, women are at higher risk than men. Risk factors you can control include:  Not getting enough exercise or physical activity.  Being overweight.  Getting too much fat, sugar, calories, or salt in your diet.  Drinking too much alcohol. SIGNS AND SYMPTOMS Hypertension does not usually cause signs or symptoms. Extremely high blood pressure (hypertensive crisis) may cause headache, anxiety, shortness of breath, and nosebleed. DIAGNOSIS To check if you have hypertension, your health care provider will measure your blood pressure while you are seated, with your arm held at the level of your heart. It should be measured at least twice using the same arm. Certain conditions can cause a difference in blood pressure between your right and left arms. A blood pressure reading that is higher than normal on one occasion does not mean that you need treatment. If  it is not clear whether you have high blood pressure, you may be asked to return on a different day to have your blood pressure checked again. Or, you may be asked to monitor your blood pressure at home for 1 or more weeks. TREATMENT Treating high blood pressure includes making lifestyle changes and possibly taking medicine. Living a healthy lifestyle can help lower high blood pressure. You may need to change some of your habits. Lifestyle changes may include:  Following the DASH diet. This diet is high in fruits, vegetables, and whole grains. It is low in salt, red meat, and added sugars.  Keep your sodium intake below 2,300 mg per day.  Getting at least 30-45 minutes of aerobic exercise at least 4 times per week.  Losing weight if necessary.  Not smoking.  Limiting alcoholic beverages.  Learning ways to reduce stress. Your health care provider may prescribe medicine if lifestyle changes are not enough to get your blood pressure under control, and if one of the following is true:  You are 18-59 years of age and your systolic blood pressure is above 140.  You are 60 years of age or older, and your systolic blood pressure is above 150.  Your diastolic blood pressure is above 90.  You have diabetes, and your systolic blood pressure is over 140 or your diastolic blood pressure is over 90.  You have kidney disease and your blood pressure is above 140/90.  You have heart disease and your blood pressure is above 140/90. Your personal target blood pressure may vary depending on your medical conditions, your age, and other factors. HOME CARE INSTRUCTIONS    Have your blood pressure rechecked as directed by your health care provider.   Take medicines only as directed by your health care provider. Follow the directions carefully. Blood pressure medicines must be taken as prescribed. The medicine does not work as well when you skip doses. Skipping doses also puts you at risk for  problems.  Do not smoke.   Monitor your blood pressure at home as directed by your health care provider. SEEK MEDICAL CARE IF:   You think you are having a reaction to medicines taken.  You have recurrent headaches or feel dizzy.  You have swelling in your ankles.  You have trouble with your vision. SEEK IMMEDIATE MEDICAL CARE IF:  You develop a severe headache or confusion.  You have unusual weakness, numbness, or feel faint.  You have severe chest or abdominal pain.  You vomit repeatedly.  You have trouble breathing. MAKE SURE YOU:   Understand these instructions.  Will watch your condition.  Will get help right away if you are not doing well or get worse.   This information is not intended to replace advice given to you by your health care provider. Make sure you discuss any questions you have with your health care provider.   Document Released: 04/26/2005 Document Revised: 09/10/2014 Document Reviewed: 02/16/2013 Elsevier Interactive Patient Education 2016 Elsevier Inc.  

## 2016-02-25 ENCOUNTER — Encounter: Payer: Self-pay | Admitting: Internal Medicine

## 2016-03-02 ENCOUNTER — Ambulatory Visit: Payer: Self-pay | Admitting: Internal Medicine

## 2016-03-05 ENCOUNTER — Inpatient Hospital Stay: Payer: Self-pay | Admitting: Family Medicine

## 2016-03-09 ENCOUNTER — Encounter (HOSPITAL_COMMUNITY): Payer: Self-pay

## 2016-03-09 ENCOUNTER — Inpatient Hospital Stay: Payer: Self-pay | Admitting: Family Medicine

## 2016-03-16 ENCOUNTER — Other Ambulatory Visit: Payer: Self-pay

## 2016-03-16 ENCOUNTER — Ambulatory Visit: Payer: Self-pay

## 2016-03-22 ENCOUNTER — Telehealth: Payer: Self-pay | Admitting: Family Medicine

## 2016-03-22 NOTE — Telephone Encounter (Signed)
Call in 1 months--- 1 po qd but she was supposed to f/u in 1 week

## 2016-03-22 NOTE — Telephone Encounter (Signed)
Patient received samples of BYSTOLIC 5mg  from Dr. Yetta BarreJones. Patient stated that the medication has been working well and she would like an actual prescription for it. Please advise.   Pharmacy: [facility] [place] - [id number] [address]

## 2016-03-23 ENCOUNTER — Other Ambulatory Visit: Payer: Self-pay

## 2016-03-23 DIAGNOSIS — I10 Essential (primary) hypertension: Secondary | ICD-10-CM

## 2016-03-23 DIAGNOSIS — G43011 Migraine without aura, intractable, with status migrainosus: Secondary | ICD-10-CM

## 2016-03-23 MED ORDER — NEBIVOLOL HCL 5 MG PO TABS: 5.0000 mg | ORAL_TABLET | Freq: Every day | ORAL | 0 refills | Status: DC

## 2016-03-23 NOTE — Telephone Encounter (Signed)
Medication sent to patients pharmacy. Left message on answering machine regarding Bystolic. Advised medication refilled. Asked patient to call back to schedule appointment for follow up in 1 week.

## 2016-03-26 ENCOUNTER — Other Ambulatory Visit: Payer: Self-pay | Admitting: Family Medicine

## 2016-03-29 ENCOUNTER — Other Ambulatory Visit: Payer: Self-pay | Admitting: Family Medicine

## 2016-03-29 ENCOUNTER — Telehealth: Payer: Self-pay | Admitting: Family Medicine

## 2016-03-29 MED FILL — SERTRALINE HCL 100 MG TAB: 100 | 30 days supply | Qty: 45 | Fill #1 | Status: TO

## 2016-03-29 MED FILL — ATORVASTATIN 10 MG TABLET: 10 | 30 days supply | Qty: 30 | Fill #1 | Status: TO

## 2016-03-29 MED FILL — lamoTRIgine 150 MG TABS: 150 | 30 days supply | Qty: 60 | Fill #1

## 2016-03-29 MED FILL — LISINOPRIL 5 MG TABLET: 5 | 30 days supply | Qty: 30 | Fill #0 | Status: TO

## 2016-03-29 NOTE — Telephone Encounter (Signed)
Caller name: Relationship to patient: Self Can be reached: [phone] Pharmacy:  Reason for call: Patient needs appt for next week for a FU. Also needs to know why her Rx has not been sent to the pharmacy.

## 2016-03-29 NOTE — Telephone Encounter (Signed)
Patient confused as to if she is suppose to be taking both Lisinopril and Bystolic. Plse adv  

## 2016-03-29 NOTE — Telephone Encounter (Signed)
Rx request to pharmacy/SLS  

## 2016-03-30 ENCOUNTER — Telehealth: Payer: Self-pay | Admitting: Family Medicine

## 2016-03-30 ENCOUNTER — Other Ambulatory Visit: Payer: Self-pay

## 2016-03-30 DIAGNOSIS — I10 Essential (primary) hypertension: Secondary | ICD-10-CM

## 2016-03-30 DIAGNOSIS — G43011 Migraine without aura, intractable, with status migrainosus: Secondary | ICD-10-CM

## 2016-03-30 MED ORDER — NEBIVOLOL HCL 5 MG PO TABS
5.0000 mg | ORAL_TABLET | Freq: Every day | ORAL | 0 refills | Status: DC
Start: 2016-03-30 — End: 2016-04-13

## 2016-03-30 NOTE — Telephone Encounter (Signed)
Caller name: Relationship to patient: Can be reached: [phone] Pharmacy:  [facility] [place] - [address] [phone] ([phone] (Fax)     Reason for call: Plse resend the Rx for Bystolic downstairs to the pharmacy today. Plse notify patient when it has been sent.

## 2016-03-30 NOTE — Telephone Encounter (Signed)
Medications have been sent to pharmacy 03/23/16 by [facility]   [facility]

## 2016-03-30 NOTE — Telephone Encounter (Signed)
Dr Yetta BarreJones added the bystolic to the lisinopril because her bp was so high She needs both and she needs f/u----I'm not here most of next week----  If nothing available I can see her the a week from Monday as long as she feels ok

## 2016-03-30 NOTE — Telephone Encounter (Signed)
Patient confused as to if she is suppose to be taking both Lisinopril and Bystolic. Plse adv

## 2016-03-30 NOTE — Telephone Encounter (Signed)
Patient was called and left a detailed message about her medication change. Patient was also informed on the message to call the ofc to schedule a f/u appt.  PC

## 2016-04-12 ENCOUNTER — Ambulatory Visit: Payer: 59

## 2016-04-13 ENCOUNTER — Encounter: Payer: Self-pay | Admitting: Physician Assistant

## 2016-04-13 ENCOUNTER — Ambulatory Visit (INDEPENDENT_AMBULATORY_CARE_PROVIDER_SITE_OTHER): Payer: 59 | Admitting: Family Medicine

## 2016-04-13 ENCOUNTER — Encounter: Payer: Self-pay | Admitting: Family Medicine

## 2016-04-13 VITALS — BP 110/70 | HR 65 | Temp 98.9°F | Resp 16 | Ht <= 58 in | Wt 134.0 lb

## 2016-04-13 DIAGNOSIS — I10 Essential (primary) hypertension: Secondary | ICD-10-CM

## 2016-04-13 DIAGNOSIS — E538 Deficiency of other specified B group vitamins: Secondary | ICD-10-CM | POA: Diagnosis not present

## 2016-04-13 DIAGNOSIS — G43011 Migraine without aura, intractable, with status migrainosus: Secondary | ICD-10-CM

## 2016-04-13 DIAGNOSIS — E785 Hyperlipidemia, unspecified: Secondary | ICD-10-CM

## 2016-04-13 DIAGNOSIS — E039 Hypothyroidism, unspecified: Secondary | ICD-10-CM

## 2016-04-13 DIAGNOSIS — Z0000 Encounter for general adult medical examination without abnormal findings: Secondary | ICD-10-CM

## 2016-04-13 LAB — COMPREHENSIVE METABOLIC PANEL
ALT: 26 U/L (ref 0–35)
AST: 33 U/L (ref 0–37)
Albumin: 4.3 g/dL (ref 3.5–5.2)
Alkaline Phosphatase: 96 U/L (ref 39–117)
BUN: 16 mg/dL (ref 6–23)
CHLORIDE: 101 meq/L (ref 96–112)
CO2: 29 meq/L (ref 19–32)
CREATININE: 0.91 mg/dL (ref 0.40–1.20)
Calcium: 9.7 mg/dL (ref 8.4–10.5)
GFR: 69.16 mL/min (ref >60.00)
Glucose, Bld: 83 mg/dL (ref 70–99)
POTASSIUM: 4.3 meq/L (ref 3.5–5.1)
SODIUM: 138 meq/L (ref 135–145)
Total Bilirubin: 0.5 mg/dL (ref 0.2–1.2)
Total Protein: 7.3 g/dL (ref 6.0–8.3)

## 2016-04-13 LAB — CBC WITH DIFFERENTIAL/PLATELET
BASOS ABS: 0 10*3/uL (ref 0.0–0.1)
BASOS PCT: 0.7 % (ref 0.0–3.0)
EOS ABS: 0.2 10*3/uL (ref 0.0–0.7)
Eosinophils Relative: 4.2 % (ref 0.0–5.0)
HEMATOCRIT: 40.7 % (ref 36.0–46.0)
Hemoglobin: 13.7 g/dL (ref 12.0–15.0)
LYMPHS PCT: 36.9 % (ref 12.0–46.0)
Lymphs Abs: 1.9 10*3/uL (ref 0.7–4.0)
MCHC: 33.7 g/dL (ref 30.0–36.0)
MCV: 87.5 fl (ref 78.0–100.0)
MONO ABS: 0.3 10*3/uL (ref 0.1–1.0)
Monocytes Relative: 5.6 % (ref 3.0–12.0)
NEUTROS ABS: 2.6 10*3/uL (ref 1.4–7.7)
Neutrophils Relative %: 52.6 % (ref 43.0–77.0)
PLATELETS: 255 10*3/uL (ref 150.0–400.0)
RBC: 4.65 Mil/uL (ref 3.87–5.11)
RDW: 13.5 % (ref 11.5–15.5)
WBC: 5 10*3/uL (ref 4.0–10.5)

## 2016-04-13 LAB — LIPID PANEL
CHOL/HDL RATIO: 3
Cholesterol: 224 mg/dL — ABNORMAL HIGH (ref 0–200)
HDL: 82.9 mg/dL (ref >39.00)
LDL CALC: 123 mg/dL — AB (ref 0–99)
NonHDL: 140.85
TRIGLYCERIDES: 87 mg/dL (ref 0.0–149.0)
VLDL: 17.4 mg/dL (ref 0.0–40.0)

## 2016-04-13 LAB — VITAMIN B12: Vitamin B-12: 652 pg/mL (ref 211–911)

## 2016-04-13 LAB — TSH: TSH: 0.57 u[IU]/mL (ref 0.35–4.50)

## 2016-04-13 MED ORDER — NEBIVOLOL HCL 5 MG PO TABS: 5.0000 mg | ORAL_TABLET | Freq: Every day | ORAL | 2 refills | Status: DC

## 2016-04-13 MED FILL — LEVOTHYROXINE 75 MCG TABLET: 75 | 30 days supply | Qty: 30 | Fill #0 | Status: TO

## 2016-04-13 MED FILL — BYSTOLIC 5 MG TABLET: 5 | 90 days supply | Qty: 90 | Fill #0 | Status: TO

## 2016-04-13 NOTE — Assessment & Plan Note (Addendum)
Stable  Pt preferred the bystolic-- she did not realize she was supposed to be on that with lisinopril.  Ran out of bystolic and started lisinopril --- she would like bystolic back

## 2016-04-13 NOTE — Progress Notes (Signed)
Patient ID: Janet Turner, female    DOB: 1965-07-23  Age: 50 y.o. MRN: [id number]    Subjective:  Subjective  HPI Janet Turner presents for bp check.  She was at [facility] after eye dr appointment because bp was high.  bystolic was added to lisinopril --- but she was not taking them together-- she took the bystolic samples for 1 month-- she finished them mid nov and now is just taking lisinopril but she likes the way she feels on bystolic better.  She did not understand that she was supposed to take both.  bp today is great on just lisinopril .    Review of Systems  Constitutional: Negative for appetite change, diaphoresis, fatigue and unexpected weight change.  Eyes: Negative for pain, redness and visual disturbance.  Respiratory: Negative for cough, chest tightness, shortness of breath and wheezing.   Cardiovascular: Negative for chest pain, palpitations and leg swelling.  Endocrine: Negative for cold intolerance, heat intolerance, polydipsia, polyphagia and polyuria.  Genitourinary: Negative for difficulty urinating, dysuria and frequency.  Neurological: Negative for dizziness, light-headedness, numbness and headaches.    History Past Medical History:  Diagnosis Date  . Amenorrhea   . Chicken pox   . Complication of anesthesia    oxygen level  and blood pressure dropped wtih anterior cervical fusion surgery   . Depression   . Family history of adverse reaction to anesthesia    mother- nausea and vomiting   . GERD (gastroesophageal reflux disease)   . Heart murmur   . Hyperlipemia   . Hypertension   . Hypoglycemia   . Hypopituitarism (HCC)   . Hypothyroidism   . Infertility, female   . Leg pain   . PTSD (post-traumatic stress disorder)   . Sleep apnea    no cpap     She has a past surgical history that includes Eye surgery (39,98,10); Cholecystectomy (97); Cesarean section (91); Breast reduction surgery (96); Anterior cervical discectomy (2013);  ganglion cyst removed; Laparoscopic gastric sleeve resection (N/A, 05/28/2014); and Upper gi endoscopy (N/A, 05/28/2014).   Her family history includes Breast cancer in her maternal grandmother; Cancer in her father; Cancer (age of onset: 65) in her mother; Depression in her father; Hypertension in her father; Lung cancer in her maternal grandmother; Melanoma in her father; Osteoarthritis in her mother; Ovarian cancer in her maternal grandmother; Prostate cancer in her paternal grandfather.She reports that she has never smoked. She has never used smokeless tobacco. She reports that she does not drink alcohol or use drugs.  Current Outpatient Prescriptions on File Prior to Visit  Medication Sig Dispense Refill  . atorvastatin (LIPITOR) 10 MG tablet Take 1 tablet (10 mg total) by mouth daily. 30 tablet 2  . cyanocobalamin (,VITAMIN B-12,) 1000 MCG/ML injection Inject 1 mL (1,000 mcg total) into the muscle every 30 (thirty) days. 1 mL 0  . lamoTRIgine (LAMICTAL) 150 MG tablet Take 300 mg by mouth every morning.     Marland Kitchen levothyroxine (SYNTHROID, LEVOTHROID) 75 MCG tablet TAKE 1 TABLET BY MOUTH DAILY BEFORE BREAKFAST. 30 tablet 3  . pramipexole (MIRAPEX) 1 MG tablet TAKE 1 TABLET BY MOUTH ONCE DAILY 90 tablet 1  . ranitidine (ZANTAC) 300 MG capsule Take 1 capsule (300 mg total) by mouth every evening. 90 capsule 3  . sertraline (ZOLOFT) 100 MG tablet Take 200 mg by mouth every morning.     . meclizine (ANTIVERT) 25 MG tablet Take 1 tablet (25 mg total) by mouth 3 (three) times daily  as needed for dizziness. (Patient not taking: Reported on 04/13/2016) 30 tablet 0  . promethazine (PHENERGAN) 12.5 MG tablet Take 1 tablet (12.5 mg total) by mouth every 6 (six) hours as needed for nausea or vomiting. (Patient not taking: Reported on 04/13/2016) 30 tablet 0  . SUMAtriptan-naproxen (TREXIMET) 85-500 MG tablet Take 1 tablet by mouth every 2 (two) hours as needed for migraine. (Patient not taking: Reported on 04/13/2016)  10 tablet 2   No current facility-administered medications on file prior to visit.      Objective:  Objective  Physical Exam  Constitutional: She is oriented to person, place, and time. She appears well-developed and well-nourished.  HENT:  Head: Normocephalic and atraumatic.  Eyes: Conjunctivae are normal.    Neck: Normal range of motion. Neck supple. No JVD present. Carotid bruit is not present. No thyromegaly present.  Cardiovascular: Normal rate, regular rhythm and normal heart sounds.   No murmur heard. Pulmonary/Chest: Effort normal and breath sounds normal. No respiratory distress. She has no wheezes. She has no rales. She exhibits no tenderness.  Musculoskeletal: She exhibits no edema.  Neurological: She is alert and oriented to person, place, and time.  Psychiatric: She has a normal mood and affect. Her behavior is normal. Judgment and thought content normal.  Nursing note and vitals reviewed.  BP 110/70 (BP Location: Left Arm, Patient Position: Sitting, Cuff Size: Normal)   Pulse 65   Temp 98.9 F (37.2 C) (Oral)   Resp 16   Ht 4\' 9"  (1.448 m)   Wt 134 lb (60.8 kg)   SpO2 95%   BMI 29.00 kg/m  Wt Readings from Last 3 Encounters:  04/13/16 134 lb (60.8 kg)  02/24/16 139 lb 10 oz (63.3 kg)  12/30/15 131 lb 14.4 oz (59.8 kg)     Lab Results  Component Value Date   WBC 5.9 11/30/2015   HGB 13.1 11/30/2015   HCT 39.5 11/30/2015   PLT 214 11/30/2015   GLUCOSE 87 11/30/2015   CHOL 195 09/19/2014   TRIG 99.0 09/19/2014   HDL 76.60 09/19/2014   LDLCALC 99 09/19/2014   ALT 18 09/19/2014   AST 26 09/19/2014   NA 137 11/30/2015   K 4.1 11/30/2015   CL 99 (L) 11/30/2015   CREATININE 0.78 11/30/2015   BUN 13 11/30/2015   CO2 30 11/30/2015   TSH 0.45 09/19/2014   HGBA1C 5.4 09/19/2014   MICROALBUR <0.7 09/19/2014    No results found.   Assessment & Plan:  Plan  I have discontinued [name]'s lisinopril. I am also having her maintain her sertraline,  lamoTRIgine, cyanocobalamin, atorvastatin, meclizine, pramipexole, ranitidine, SUMAtriptan-naproxen, promethazine, levothyroxine, and nebivolol.  Meds ordered this encounter  Medications  . DISCONTD: nebivolol (BYSTOLIC) 5 MG tablet    Sig: Take 1 tablet (5 mg total) by mouth daily.    Dispense:  30 tablet    Refill:  2  . nebivolol (BYSTOLIC) 5 MG tablet    Sig: Take 1 tablet (5 mg total) by mouth daily.    Dispense:  30 tablet    Refill:  2    Problem List Items Addressed This Visit      Unprioritized   Hypothyroidism   Relevant Medications   nebivolol (BYSTOLIC) 5 MG tablet   Other Relevant Orders   CBC with Differential/Platelet   TSH   B12 deficiency   Relevant Orders   Vitamin B12   Intractable migraine without aura and with status migrainosus   Relevant Medications  nebivolol (BYSTOLIC) 5 MG tablet   HTN (hypertension) - Primary    Stable  Pt preferred the bystolic-- she did not realize she was supposed to be on that with lisinopril.  Ran out of bystolic and started lisinopril --- she would like bystolic back      Relevant Medications   nebivolol (BYSTOLIC) 5 MG tablet   Other Relevant Orders   Comprehensive metabolic panel   CBC with Differential/Platelet    Other Visit Diagnoses    Hyperlipidemia, unspecified hyperlipidemia type       Relevant Medications   nebivolol (BYSTOLIC) 5 MG tablet   Other Relevant Orders   Lipid panel   Preventative health care       Relevant Orders   Ambulatory referral to Gastroenterology      Follow-up: Return in about 3 months (around 07/12/2016) for hypertension.  Donato SchultzYvonne R Lowne Chase, DO

## 2016-04-13 NOTE — Patient Instructions (Signed)
Hypertension Hypertension, commonly called high blood pressure, is when the force of blood pumping through your arteries is too strong. Your arteries are the blood vessels that carry blood from your heart throughout your body. A blood pressure reading consists of a higher number over a lower number, such as 110/72. The higher number (systolic) is the pressure inside your arteries when your heart pumps. The lower number (diastolic) is the pressure inside your arteries when your heart relaxes. Ideally you want your blood pressure below 120/80. Hypertension forces your heart to work harder to pump blood. Your arteries may become narrow or stiff. Having untreated or uncontrolled hypertension can cause heart attack, stroke, kidney disease, and other problems. What increases the risk? Some risk factors for high blood pressure are controllable. Others are not. Risk factors you cannot control include:  Race. You may be at higher risk if you are African American.  Age. Risk increases with age.  Gender. Men are at higher risk than women before age 45 years. After age 65, women are at higher risk than men. Risk factors you can control include:  Not getting enough exercise or physical activity.  Being overweight.  Getting too much fat, sugar, calories, or salt in your diet.  Drinking too much alcohol. What are the signs or symptoms? Hypertension does not usually cause signs or symptoms. Extremely high blood pressure (hypertensive crisis) may cause headache, anxiety, shortness of breath, and nosebleed. How is this diagnosed? To check if you have hypertension, your health care provider will measure your blood pressure while you are seated, with your arm held at the level of your heart. It should be measured at least twice using the same arm. Certain conditions can cause a difference in blood pressure between your right and left arms. A blood pressure reading that is higher than normal on one occasion does  not mean that you need treatment. If it is not clear whether you have high blood pressure, you may be asked to return on a different day to have your blood pressure checked again. Or, you may be asked to monitor your blood pressure at home for 1 or more weeks. How is this treated? Treating high blood pressure includes making lifestyle changes and possibly taking medicine. Living a healthy lifestyle can help lower high blood pressure. You may need to change some of your habits. Lifestyle changes may include:  Following the DASH diet. This diet is high in fruits, vegetables, and whole grains. It is low in salt, red meat, and added sugars.  Keep your sodium intake below 2,300 mg per day.  Getting at least 30-45 minutes of aerobic exercise at least 4 times per week.  Losing weight if necessary.  Not smoking.  Limiting alcoholic beverages.  Learning ways to reduce stress. Your health care provider may prescribe medicine if lifestyle changes are not enough to get your blood pressure under control, and if one of the following is true:  You are 18-59 years of age and your systolic blood pressure is above 140.  You are 60 years of age or older, and your systolic blood pressure is above 150.  Your diastolic blood pressure is above 90.  You have diabetes, and your systolic blood pressure is over 140 or your diastolic blood pressure is over 90.  You have kidney disease and your blood pressure is above 140/90.  You have heart disease and your blood pressure is above 140/90. Your personal target blood pressure may vary depending on your medical   conditions, your age, and other factors. Follow these instructions at home:  Have your blood pressure rechecked as directed by your health care provider.  Take medicines only as directed by your health care provider. Follow the directions carefully. Blood pressure medicines must be taken as prescribed. The medicine does not work as well when you skip  doses. Skipping doses also puts you at risk for problems.  Do not smoke.  Monitor your blood pressure at home as directed by your health care provider. Contact a health care provider if:  You think you are having a reaction to medicines taken.  You have recurrent headaches or feel dizzy.  You have swelling in your ankles.  You have trouble with your vision. Get help right away if:  You develop a severe headache or confusion.  You have unusual weakness, numbness, or feel faint.  You have severe chest or abdominal pain.  You vomit repeatedly.  You have trouble breathing. This information is not intended to replace advice given to you by your health care provider. Make sure you discuss any questions you have with your health care provider. Document Released: 04/26/2005 Document Revised: 10/02/2015 Document Reviewed: 02/16/2013 Elsevier Interactive Patient Education  2017 Elsevier Inc.  

## 2016-04-13 NOTE — Progress Notes (Signed)
Pre visit review using our clinic review tool, if applicable. No additional management support is needed unless otherwise documented below in the visit note. 

## 2016-04-19 ENCOUNTER — Other Ambulatory Visit: Payer: Self-pay

## 2016-04-19 MED ORDER — ATORVASTATIN CALCIUM 20 MG PO TABS: 20.0000 mg | ORAL_TABLET | Freq: Every day | ORAL | 1 refills | Status: DC

## 2016-04-20 ENCOUNTER — Ambulatory Visit: Payer: Self-pay | Admitting: Physician Assistant

## 2016-04-23 ENCOUNTER — Encounter: Payer: Self-pay | Admitting: Internal Medicine

## 2016-04-29 ENCOUNTER — Telehealth: Payer: Self-pay | Admitting: *Deleted

## 2016-04-29 NOTE — Telephone Encounter (Signed)
Received a fax from [facility].  Called patient to see if she was indeed changing over to them.  Patient stated that she will not be using them because they are too expensive.  Fax sent back as a denial with note that she will not be using them.

## 2016-04-30 ENCOUNTER — Other Ambulatory Visit: Payer: Self-pay | Admitting: Family Medicine

## 2016-04-30 NOTE — Telephone Encounter (Signed)
Rx request denied because Pt requested medication too soon. (Lipitor 20mg  po qd). Pt was written a 90-day supply. Last RF was written on 04/19/2016 and refilled on 04/26/2016

## 2016-05-04 MED FILL — SERTRALINE HCL 100 MG TAB: 100 | 90 days supply | Qty: 135 | Fill #0 | Status: TO

## 2016-05-05 MED FILL — LEVOTHYROXINE 25 MCG TABLET: 25 | 30 days supply | Qty: 30 | Fill #0

## 2016-05-05 MED FILL — lamoTRIgine 150 MG TABS: 150 | 30 days supply | Qty: 60 | Fill #0

## 2016-05-05 MED FILL — ATORVASTATIN 20 MG TABLET: 20 | 90 days supply | Qty: 90 | Fill #0 | Status: TO

## 2016-05-05 MED FILL — PRAMIPEXOLE 1 MG TABLET: 1 | 90 days supply | Qty: 90 | Fill #0 | Status: TO

## 2016-05-07 ENCOUNTER — Ambulatory Visit: Payer: Self-pay | Admitting: Physician Assistant

## 2016-05-07 MED FILL — SERTRALINE HCL 100 MG TAB: 100 | 30 days supply | Qty: 45 | Fill #0

## 2016-05-25 MED FILL — raNITIdine HCL 300 MG TABS: 300 | 90 days supply | Qty: 90 | Fill #0 | Status: TO

## 2016-05-31 ENCOUNTER — Encounter: Payer: Self-pay | Admitting: *Deleted

## 2016-05-31 ENCOUNTER — Telehealth: Payer: Self-pay | Admitting: Family Medicine

## 2016-05-31 MED FILL — SERTRALINE HCL 100 MG TAB: 100 | 30 days supply | Qty: 45 | Fill #1 | Status: TO

## 2016-05-31 MED FILL — LEVOTHYROXINE 75 MCG TABLET: 75 | 30 days supply | Qty: 30 | Fill #0 | Status: TO

## 2016-05-31 MED FILL — lamoTRIgine 150 MG TABS: 150 | 30 days supply | Qty: 60 | Fill #1

## 2016-05-31 NOTE — Telephone Encounter (Signed)
Patient called and did schedule her tomorrow 06/01/2016 at 6:15 pm with PCP.

## 2016-05-31 NOTE — Telephone Encounter (Signed)
She can do e visit if she can not come in-- but normally we do bring people in

## 2016-05-31 NOTE — Telephone Encounter (Signed)
Caller name: Relationship to patient: Self Can be reached: [phone]  Pharmacy:  [facility] [place], [place] - [address] [phone])     Reason for call: Patient states she has frequent UTI's and PCP normally calls her in a Rx. STates she needs a Rx called in

## 2016-06-01 ENCOUNTER — Ambulatory Visit: Payer: Self-pay | Admitting: Family Medicine

## 2016-06-01 ENCOUNTER — Ambulatory Visit: Payer: 59 | Admitting: Family Medicine

## 2016-06-22 ENCOUNTER — Ambulatory Visit: Payer: Self-pay | Admitting: Internal Medicine

## 2016-06-22 DIAGNOSIS — G4733 Obstructive sleep apnea (adult) (pediatric): Secondary | ICD-10-CM | POA: Diagnosis not present

## 2016-06-28 ENCOUNTER — Encounter: Payer: Self-pay | Admitting: Nurse Practitioner

## 2016-06-28 NOTE — Progress Notes (Signed)
Patient ID: Janet Turner, female   DOB: 06/07/1965, 51 y.o.   MRN: [id number]  51 y.o. Janet [id number] Married Caucasian Fe here for annual exam. Now menopausal.  Took Premarin and Provera  since age 51 for hypopituitary.  History of infertility. History of abnormal pap.  Patient's last menstrual period was 01/15/2000.          Sexually active: Yes.    The current method of family planning is post menopausal status.    Exercising: No.  The patient does not participate in regular exercise at present. Smoker:  no  Health Maintenance: Pap: 01/15/15, Negative with neg HR HPV  11/05/13, Negative with pos HR HPV, neg 16/18, pos other type (Care Everywhere)  05/09/12, Negative with pos HR HPV (type 14,78,2954,84,45) (Care Everywhere) MMG: 11/19/14, 3D, Bi-Rads 1:  Negative, scheduled for 07/06/16 Colonoscopy: Never has one scheduled BMD: 03/15/14 T Score, -0.9 Spine / -1.3 Right Femur Neck / -1.2 Left Femur Neck, scheduled for 07/06/16 TDaP: 11/25/13 Labs: PCP takes care of all labs   reports that she has never smoked. She has never used smokeless tobacco. She reports that she does not drink alcohol or use drugs.  Past Medical History:  Diagnosis Date  . Abnormal Pap smear of cervix 2013, 2015   positive HR HPV  . Amenorrhea   . Chicken pox   . Complication of anesthesia    oxygen level  and blood pressure dropped wtih anterior cervical fusion surgery   . Depression   . Family history of adverse reaction to anesthesia    mother- nausea and vomiting   . Gastritis    chronic, inactive  . GERD (gastroesophageal reflux disease)   . Heart murmur   . Hyperlipemia   . Hypertension   . Hypoglycemia   . Hypopituitarism (HCC)   . Hypothyroidism   . Infertility, female   . Leg pain   . PTSD (post-traumatic stress disorder)   . Sleep apnea    no cpap     Past Surgical History:  Procedure Laterality Date  . ANTERIOR CERVICAL DISCECTOMY  2013   Ant Cervical Fusion C4-5 with diskectomy  . BREAST  REDUCTION SURGERY  96  . CESAREAN SECTION  91  . CHOLECYSTECTOMY  97  . EYE SURGERY  [phone]   x's 3   . ganglion cyst removed    . LAPAROSCOPIC GASTRIC SLEEVE RESECTION N/A 05/28/2014   Procedure: LAPAROSCOPIC GASTRIC SLEEVE RESECTION;  Surgeon: Valarie MerinoMatthew B Martin, MD;  Location: [facility];  Service: General;  Laterality: N/A;  . UPPER GI ENDOSCOPY N/A 05/28/2014   Procedure: UPPER GI ENDOSCOPY;  Surgeon: Valarie MerinoMatthew B Martin, MD;  Location: [facility];  Service: General;  Laterality: N/A;    Current Outpatient Prescriptions  Medication Sig Dispense Refill  . ARIPiprazole (ABILIFY) 10 MG tablet Take 10 mg by mouth daily.    Marland Kitchen. atorvastatin (LIPITOR) 20 MG tablet Take 1 tablet (20 mg total) by mouth daily. 90 tablet 1  . cyanocobalamin (,VITAMIN B-12,) 1000 MCG/ML injection Inject 1 mL (1,000 mcg total) into the muscle every 30 (thirty) days. 1 mL 0  . lamoTRIgine (LAMICTAL) 150 MG tablet Take 300 mg by mouth every morning.     Marland Kitchen. levothyroxine (SYNTHROID, LEVOTHROID) 75 MCG tablet TAKE 1 TABLET BY MOUTH DAILY BEFORE BREAKFAST. 30 tablet 3  . meclizine (ANTIVERT) 25 MG tablet Take 1 tablet (25 mg total) by mouth 3 (three) times daily as needed for dizziness. 30 tablet 0  . nebivolol (BYSTOLIC) 5  MG tablet Take 1 tablet (5 mg total) by mouth daily. 30 tablet 2  . pramipexole (MIRAPEX) 1 MG tablet TAKE 1 TABLET BY MOUTH ONCE DAILY 90 tablet 1  . ranitidine (ZANTAC) 300 MG capsule Take 1 capsule (300 mg total) by mouth every evening. 90 capsule 3  . sertraline (ZOLOFT) 100 MG tablet Take 200 mg by mouth every morning.      No current facility-administered medications for this visit.     Family History  Problem Relation Age of Onset  . Osteoarthritis Mother   . Cancer Mother 59    breast--stage III  invasive ductal ca  . Hypertension Father   . Melanoma Father   . Depression Father     PTSD  . Cancer Father     melanoma  . Alcoholism    . Stroke      Paternal Family--9/12 children after age 50   . Anuerysm      Paternal family  . Hypertension      Paternal family  . Ovarian cancer Maternal Grandmother   . Breast cancer Maternal Grandmother   . Lung cancer Maternal Grandmother   . Prostate cancer Paternal Grandfather     ROS:  Pertinent items are noted in HPI.  Otherwise, a comprehensive ROS was negative.  Exam:   BP 110/68 (BP Location: Right Arm, Patient Position: Sitting, Cuff Size: Normal)   Pulse 68   Resp 14   Ht 4\' 9"  (1.448 m)   Wt 136 lb (61.7 kg)   LMP 01/15/2000   BMI 29.43 kg/m  Height: 4\' 9"  (144.8 cm) Ht Readings from Last 3 Encounters:  06/29/16 4\' 9"  (1.448 m)  04/13/16 4\' 9"  (1.448 m)  02/24/16 4\' 9"  (1.448 m)    General appearance: alert, cooperative and appears stated age Head: Normocephalic, without obvious abnormality, atraumatic Neck: no adenopathy, supple, symmetrical, trachea midline and thyroid normal to inspection and palpation Lungs: clear to auscultation bilaterally Breasts: normal appearance, no masses or tenderness Heart: regular rate and rhythm Abdomen: soft, non-tender; no masses,  no organomegaly Extremities: extremities normal, atraumatic, no cyanosis or edema Skin: Skin color, texture, turgor normal. No rashes or lesions Lymph nodes: Cervical, supraclavicular, and axillary nodes normal. No abnormal inguinal nodes palpated Neurologic: Grossly normal   Pelvic: External genitalia:  no lesions              Urethra:  normal appearing urethra with no masses, tenderness or lesions              Bartholin's and Skene's: normal                 Vagina: normal appearing vagina with normal color and discharge, no lesions              Cervix: anteverted              Pap taken: Yes.   Bimanual Exam:  Uterus:  normal size, contour, position, consistency, mobility, non-tender              Adnexa: no mass, fullness, tenderness               Rectovaginal: Confirms               Anus:  normal sphincter tone, no lesions  Chaperone present:  yes  A:  Well Woman with normal exam  History of abnormal pap with HPV 2013 & 2015, normal 2016  Hypopituitarism  Anorgasmic  History of GERD, hyperlipidemia  S/P gastric sleeve 05/2014  S/P breast reduction surgery 1996  P:   Reviewed health and wellness pertinent to exam  Pap smear was done  Mammogram is due 07/06/16 and BMD on same day.  Counseled on breast self exam, mammography screening, adequate intake of calcium and vitamin D, diet and exercise return annually or prn  An After Visit Summary was printed and given to the patient.

## 2016-06-29 ENCOUNTER — Encounter: Payer: Self-pay | Admitting: Nurse Practitioner

## 2016-06-29 ENCOUNTER — Ambulatory Visit (INDEPENDENT_AMBULATORY_CARE_PROVIDER_SITE_OTHER): Payer: 59 | Admitting: Nurse Practitioner

## 2016-06-29 ENCOUNTER — Ambulatory Visit: Payer: 59 | Admitting: Certified Nurse Midwife

## 2016-06-29 VITALS — BP 110/68 | HR 68 | Resp 14 | Ht <= 58 in | Wt 136.0 lb

## 2016-06-29 DIAGNOSIS — R87619 Unspecified abnormal cytological findings in specimens from cervix uteri: Secondary | ICD-10-CM

## 2016-06-29 DIAGNOSIS — Z124 Encounter for screening for malignant neoplasm of cervix: Secondary | ICD-10-CM | POA: Diagnosis not present

## 2016-06-29 DIAGNOSIS — F3175 Bipolar disorder, in partial remission, most recent episode depressed: Secondary | ICD-10-CM | POA: Diagnosis not present

## 2016-06-29 DIAGNOSIS — Z0000 Encounter for general adult medical examination without abnormal findings: Secondary | ICD-10-CM

## 2016-06-29 DIAGNOSIS — Z01411 Encounter for gynecological examination (general) (routine) with abnormal findings: Secondary | ICD-10-CM | POA: Diagnosis not present

## 2016-06-29 DIAGNOSIS — Z1151 Encounter for screening for human papillomavirus (HPV): Secondary | ICD-10-CM | POA: Diagnosis not present

## 2016-06-29 NOTE — Patient Instructions (Signed)

## 2016-07-01 MED FILL — lamoTRIgine 150 MG TABS: 150 | 90 days supply | Qty: 180 | Fill #0 | Status: TO

## 2016-07-01 MED FILL — LEVOTHYROXINE 75 MCG TABLET: 75 | 30 days supply | Qty: 30 | Fill #1 | Status: TO

## 2016-07-01 MED FILL — ARIPiprazole 10 MG TABS: 10 | 30 days supply | Qty: 30 | Fill #0 | Status: TO

## 2016-07-01 NOTE — Progress Notes (Signed)
Encounter reviewed by Dr. Janean SarkBrook Amundson C. Silva. Has an appointment with [facility] in April 2018.

## 2016-07-02 LAB — IPS PAP TEST WITH HPV

## 2016-07-05 NOTE — Addendum Note (Signed)
Addended by: Roanna BanningGRUBB, Shaundrea Carrigg R on: 07/05/2016 08:01 AM   Modules accepted: Orders

## 2016-07-06 ENCOUNTER — Ambulatory Visit
Admission: RE | Admit: 2016-07-06 | Discharge: 2016-07-06 | Disposition: A | Discharge status: Home / Self Care | Payer: 59 | Source: Ambulatory Visit | Attending: Family Medicine | Admitting: Family Medicine

## 2016-07-06 DIAGNOSIS — Z780 Asymptomatic menopausal state: Secondary | ICD-10-CM | POA: Diagnosis not present

## 2016-07-06 DIAGNOSIS — Z1231 Encounter for screening mammogram for malignant neoplasm of breast: Secondary | ICD-10-CM | POA: Diagnosis not present

## 2016-07-06 DIAGNOSIS — Z9889 Other specified postprocedural states: Secondary | ICD-10-CM

## 2016-07-06 DIAGNOSIS — M85851 Other specified disorders of bone density and structure, right thigh: Secondary | ICD-10-CM | POA: Diagnosis not present

## 2016-07-06 DIAGNOSIS — Z803 Family history of malignant neoplasm of breast: Secondary | ICD-10-CM

## 2016-07-06 DIAGNOSIS — E2839 Other primary ovarian failure: Secondary | ICD-10-CM

## 2016-07-06 LAB — IPS HPV GENOTYPING 16/18

## 2016-07-13 ENCOUNTER — Ambulatory Visit: Payer: Self-pay | Admitting: Family Medicine

## 2016-07-13 ENCOUNTER — Ambulatory Visit (INDEPENDENT_AMBULATORY_CARE_PROVIDER_SITE_OTHER): Payer: 59 | Admitting: Nurse Practitioner

## 2016-07-13 ENCOUNTER — Encounter: Payer: Self-pay | Admitting: Family Medicine

## 2016-07-13 ENCOUNTER — Encounter: Payer: Self-pay | Admitting: Nurse Practitioner

## 2016-07-13 VITALS — BP 118/74 | HR 64 | Ht <= 58 in | Wt 137.0 lb

## 2016-07-13 DIAGNOSIS — R87810 Cervical high risk human papillomavirus (HPV) DNA test positive: Secondary | ICD-10-CM | POA: Diagnosis not present

## 2016-07-13 DIAGNOSIS — E559 Vitamin D deficiency, unspecified: Secondary | ICD-10-CM | POA: Diagnosis not present

## 2016-07-13 DIAGNOSIS — Z0289 Encounter for other administrative examinations: Secondary | ICD-10-CM

## 2016-07-13 DIAGNOSIS — Z809 Family history of malignant neoplasm, unspecified: Secondary | ICD-10-CM | POA: Diagnosis not present

## 2016-07-13 DIAGNOSIS — N3281 Overactive bladder: Secondary | ICD-10-CM | POA: Diagnosis not present

## 2016-07-13 NOTE — Progress Notes (Signed)
51 y.o. Married Caucasian female [phone] here for follow up:  Several concerns as listed below: Pap smear results.  She had a pap that was normal but with positive HR HPV in 2013; 2015, and now 2018.  The HPV genotype was negative for # 16 & 18.  It is now recommended that she have a colpo biopsy for completeness.  She has never had this before and has questions as what to expect.  Will get that scheduled today while her with Dr. Oscar LaJertson.  The BMD done 07/06/16 shows T score at right hip 02.1; spine at -0.9.  Her FRAX score for major fracture is 6.2% and hip fracture is 0.9%.  Her dietary intake of calcium and Vit D is low.  Since she has had a gastric bypass she normally is low in all vitamins.  She wants another Vit D level today - last one 2015 before her surgery was 34.  She is going to work harder at exercise by going to the gym.  Both with cardio and upper body weights.  Will repeat BMD in 2 yrs.  OAB:  She has seen Urology about this a few yrs ago.  She was told to do Kegel exercise - but that has made very little difference.  She does not want medication therapy at this time.  She is willing to see Uro PT for pelvic floor strengthening.  Order is placed for this.  Genetic testing:  She has FMH of cancer - breast, OV, prostate, lung, melanoma.  Order is placed for this.    O: Healthy WD,WN female Affect: no distress but concerned   A: History of abnormal pap  Osteopenia of hip  OAB  FMH of cancer      P: Discussed findings of pap and needing to proceed with colpo biopsy.  She will be scheduled while in the office today.  Other referrals are made.  Vit d labs drawn and will follow.   Labs :  Vit D  Consult time 20 minutes face to face.

## 2016-07-13 NOTE — Patient Instructions (Signed)
Will make apt for you.

## 2016-07-13 NOTE — Progress Notes (Signed)
Patient scheduled for colposcopy with Dr. Oscar LaJertson while in office. Patient states she can only come on Tuesdays and would prefer next week. Patient scheduled for 07/20/16 at 3:30pm. Advised to take Motrin 800 mg with food and water one hour before procedure. Patient is agreeable to date and time.

## 2016-07-14 LAB — VITAMIN D 25 HYDROXY (VIT D DEFICIENCY, FRACTURES): Vit D, 25-Hydroxy: 38 ng/mL (ref 30–100)

## 2016-07-15 ENCOUNTER — Telehealth: Payer: Self-pay | Admitting: Obstetrics and Gynecology

## 2016-07-15 ENCOUNTER — Telehealth: Payer: Self-pay | Admitting: *Deleted

## 2016-07-15 NOTE — Telephone Encounter (Signed)
Called patient to review benefits for a scheduled procedure. Left Voicemail requesting a call back. °

## 2016-07-15 NOTE — Telephone Encounter (Signed)
I have attempted to contact this patient by phone with the following results: left message to return call to Green LaneStephanie at [phone] on answering machine (mobile).  No personal information given, mobile number does not match mobile number listed on last DPR. [phone] (Mobile) *Preferred*

## 2016-07-15 NOTE — Telephone Encounter (Signed)
-----   Message from Verner Choleborah S Leonard, CNM sent at 07/15/2016  8:06 AM EST ----- Notify patient that Vitamin D is normal

## 2016-07-16 NOTE — Telephone Encounter (Signed)
Patient is returning a call to Suzy. °

## 2016-07-19 NOTE — Progress Notes (Signed)
Encounter reviewed by Dr. Brook Amundson C. Silva.  

## 2016-07-20 ENCOUNTER — Ambulatory Visit (INDEPENDENT_AMBULATORY_CARE_PROVIDER_SITE_OTHER): Payer: 59 | Admitting: Family Medicine

## 2016-07-20 ENCOUNTER — Encounter: Payer: Self-pay | Admitting: Family Medicine

## 2016-07-20 ENCOUNTER — Ambulatory Visit: Payer: Self-pay | Admitting: Obstetrics and Gynecology

## 2016-07-20 ENCOUNTER — Telehealth: Payer: Self-pay | Admitting: Obstetrics and Gynecology

## 2016-07-20 VITALS — BP 116/80 | HR 70 | Temp 97.9°F | Resp 16 | Ht <= 58 in | Wt 138.4 lb

## 2016-07-20 DIAGNOSIS — E038 Other specified hypothyroidism: Secondary | ICD-10-CM | POA: Diagnosis not present

## 2016-07-20 DIAGNOSIS — F3175 Bipolar disorder, in partial remission, most recent episode depressed: Secondary | ICD-10-CM | POA: Diagnosis not present

## 2016-07-20 DIAGNOSIS — I10 Essential (primary) hypertension: Secondary | ICD-10-CM

## 2016-07-20 DIAGNOSIS — K219 Gastro-esophageal reflux disease without esophagitis: Secondary | ICD-10-CM | POA: Diagnosis not present

## 2016-07-20 DIAGNOSIS — E785 Hyperlipidemia, unspecified: Secondary | ICD-10-CM

## 2016-07-20 DIAGNOSIS — G4733 Obstructive sleep apnea (adult) (pediatric): Secondary | ICD-10-CM | POA: Diagnosis not present

## 2016-07-20 DIAGNOSIS — E538 Deficiency of other specified B group vitamins: Secondary | ICD-10-CM

## 2016-07-20 MED ORDER — PANTOPRAZOLE SODIUM 40 MG PO TBEC: 40.0000 mg | DELAYED_RELEASE_TABLET | Freq: Every day | ORAL | 2 refills | Status: DC

## 2016-07-20 MED ORDER — CYANOCOBALAMIN 1000 MCG/ML IJ SOLN
1000.0000 ug | Freq: Once | INTRAMUSCULAR | Status: AC
  Administered 2016-07-20: 1000 ug via INTRAMUSCULAR

## 2016-07-20 NOTE — Patient Instructions (Signed)

## 2016-07-20 NOTE — Telephone Encounter (Signed)
Patient was notified of lab results at office visit on 07/14/15.  Closing encounter.

## 2016-07-20 NOTE — Assessment & Plan Note (Signed)
Check labs con't synthroid 

## 2016-07-20 NOTE — Assessment & Plan Note (Signed)
Per psych Pt is going to [facility] tomorrow for eval con't [facility] as well

## 2016-07-20 NOTE — Progress Notes (Signed)
Patient ID: Janet Turner, female   DOB: 04/21/1966, 51 y.o.   MRN: [id number]     Subjective:  I acted as a Janet Turner for Dr. Zola Button.  Janet Turner, CMA   Patient ID: Janet Turner, female    DOB: 1966-03-31, 51 y.o.   MRN: [id number]  Chief Complaint  Patient presents with  . Hypertension  . Hyperlipidemia  . Hypothyroidism  . Gastroesophageal Reflux     HPI  Patient is in today for follow up blood pressure, thyroid, and cholesterol.  Patient is going into [facility] for help.  Got the CPAP and has panic attacks because she feels like she cannot breathe.  Waking up with bad reflux.  Feels like hot acid coming up.    Patient Care Team: Donato Schultz, DO as PCP - General (Family Medicine)   Past Medical History:  Diagnosis Date  . Abnormal Pap smear of cervix 2013, 2015   positive HR HPV  . Amenorrhea   . Chicken pox   . Complication of anesthesia    oxygen level  and blood pressure dropped wtih anterior cervical fusion surgery   . Depression   . Family history of adverse reaction to anesthesia    mother- nausea and vomiting   . Gastritis    chronic, inactive  . GERD (gastroesophageal reflux disease)   . Heart murmur   . Hyperlipemia   . Hypertension   . Hypoglycemia   . Hypopituitarism (HCC)   . Hypothyroidism   . Infertility, female   . Leg pain   . PTSD (post-traumatic stress disorder)   . Sleep apnea    no cpap     Past Surgical History:  Procedure Laterality Date  . ANTERIOR CERVICAL DISCECTOMY  2013   Ant Cervical Fusion C4-5 with diskectomy  . BREAST REDUCTION SURGERY  96  . CESAREAN SECTION  91  . CHOLECYSTECTOMY  97  . EYE SURGERY  [phone]   x's 3   . ganglion cyst removed    . LAPAROSCOPIC GASTRIC SLEEVE RESECTION N/A 05/28/2014   Procedure: LAPAROSCOPIC GASTRIC SLEEVE RESECTION;  Surgeon: Valarie Merino, MD;  Location: [facility];  Service: General;  Laterality: N/A;  . UPPER GI ENDOSCOPY N/A 05/28/2014   Procedure:  UPPER GI ENDOSCOPY;  Surgeon: Valarie Merino, MD;  Location: [facility];  Service: General;  Laterality: N/A;    Family History  Problem Relation Age of Onset  . Osteoarthritis Mother   . Cancer Mother 90    breast--stage III  invasive ductal ca  . Breast cancer Mother   . Hypertension Father   . Melanoma Father   . Depression Father     PTSD  . Cancer Father     melanoma  . Alcoholism    . Stroke      Paternal Family--9/12 children after age 25  . Anuerysm      Paternal family  . Hypertension      Paternal family  . Ovarian cancer Maternal Grandmother   . Lung cancer Maternal Grandmother   . Cancer - Ovarian Maternal Grandmother 40    surgery and chemo  . Breast cancer Maternal Grandmother 50    mastectomy.  recurence age 19 on the other, mastectomy.  treatedd with chemo and RX  . Prostate cancer Paternal Grandfather     Social History   Social History  . Marital status: Married    Spouse name: N/A  . Number of children: N/A  . Years of  education: N/A   Occupational History  . Not on file.   Social History Main Topics  . Smoking status: Never Smoker  . Smokeless tobacco: Never Used  . Alcohol use No  . Drug use: No  . Sexual activity: Yes    Partners: Male    Birth control/ protection: Post-menopausal   Other Topics Concern  . Not on file   Social History Narrative  . No narrative on file    Outpatient Medications Prior to Visit  Medication Sig Dispense Refill  . ARIPiprazole (ABILIFY) 10 MG tablet Take 10 mg by mouth daily.    Marland Kitchen. atorvastatin (LIPITOR) 20 MG tablet Take 1 tablet (20 mg total) by mouth daily. 90 tablet 1  . cyanocobalamin (,VITAMIN B-12,) 1000 MCG/ML injection Inject 1 mL (1,000 mcg total) into the muscle every 30 (thirty) days. 1 mL 0  . lamoTRIgine (LAMICTAL) 150 MG tablet Take 300 mg by mouth every morning.     Marland Kitchen. levothyroxine (SYNTHROID, LEVOTHROID) 75 MCG tablet TAKE 1 TABLET BY MOUTH DAILY BEFORE BREAKFAST. 30 tablet 3  . nebivolol  (BYSTOLIC) 5 MG tablet Take 1 tablet (5 mg total) by mouth daily. 30 tablet 2  . pramipexole (MIRAPEX) 1 MG tablet TAKE 1 TABLET BY MOUTH ONCE DAILY 90 tablet 1  . sertraline (ZOLOFT) 100 MG tablet Take 200 mg by mouth every morning.     . meclizine (ANTIVERT) 25 MG tablet Take 1 tablet (25 mg total) by mouth 3 (three) times daily as needed for dizziness. 30 tablet 0  . ranitidine (ZANTAC) 300 MG capsule Take 1 capsule (300 mg total) by mouth every evening. 90 capsule 3   No facility-administered medications prior to visit.     Allergies  Allergen Reactions  . Penicillins Hives and Itching    Review of Systems  Constitutional: Negative for fever and malaise/fatigue.  HENT: Negative for congestion.   Eyes: Negative for blurred vision.  Respiratory: Negative for cough and shortness of breath.   Cardiovascular: Negative for chest pain, palpitations and leg swelling.  Gastrointestinal: Negative for vomiting.  Musculoskeletal: Negative for back pain.  Skin: Negative for rash.  Neurological: Negative for loss of consciousness and headaches.       Objective:    Physical Exam  Constitutional: She is oriented to person, place, and time. She appears well-developed and well-nourished. No distress.  HENT:  Head: Normocephalic and atraumatic.  Eyes: Conjunctivae are normal.  Neck: Normal range of motion. No thyromegaly present.  Cardiovascular: Normal rate and regular rhythm.   Pulmonary/Chest: Effort normal and breath sounds normal. She has no wheezes.  Abdominal: Soft. Bowel sounds are normal. There is no tenderness.  Musculoskeletal: Normal range of motion. She exhibits no edema or deformity.  Neurological: She is alert and oriented to person, place, and time.  Skin: Skin is warm and dry. She is not diaphoretic.  Psychiatric: She has a normal mood and affect.    BP 116/80 (BP Location: Left Arm, Cuff Size: Normal)   Pulse 70   Temp 97.9 F (36.6 C) (Oral)   Resp 16   Ht 4\' 9"   (1.448 m)   Wt 138 lb 6.4 oz (62.8 kg)   LMP 01/15/2000   SpO2 98%   BMI 29.95 kg/m  Wt Readings from Last 3 Encounters:  07/20/16 138 lb 6.4 oz (62.8 kg)  07/13/16 137 lb (62.1 kg)  06/29/16 136 lb (61.7 kg)     Lab Results  Component Value Date   WBC 5.0  04/13/2016   HGB 13.7 04/13/2016   HCT 40.7 04/13/2016   PLT 255.0 04/13/2016   GLUCOSE 83 04/13/2016   CHOL 224 (H) 04/13/2016   TRIG 87.0 04/13/2016   HDL 82.90 04/13/2016   LDLCALC 123 (H) 04/13/2016   ALT 26 04/13/2016   AST 33 04/13/2016   NA 138 04/13/2016   K 4.3 04/13/2016   CL 101 04/13/2016   CREATININE 0.91 04/13/2016   BUN 16 04/13/2016   CO2 29 04/13/2016   TSH 0.57 04/13/2016   HGBA1C 5.4 09/19/2014   MICROALBUR <0.7 09/19/2014    Lab Results  Component Value Date   TSH 0.57 04/13/2016   Lab Results  Component Value Date   WBC 5.0 04/13/2016   HGB 13.7 04/13/2016   HCT 40.7 04/13/2016   MCV 87.5 04/13/2016   PLT 255.0 04/13/2016   Lab Results  Component Value Date   NA 138 04/13/2016   K 4.3 04/13/2016   CO2 29 04/13/2016   GLUCOSE 83 04/13/2016   BUN 16 04/13/2016   CREATININE 0.91 04/13/2016   BILITOT 0.5 04/13/2016   ALKPHOS 96 04/13/2016   AST 33 04/13/2016   ALT 26 04/13/2016   PROT 7.3 04/13/2016   ALBUMIN 4.3 04/13/2016   CALCIUM 9.7 04/13/2016   ANIONGAP 8 11/30/2015   GFR 69.16 04/13/2016   Lab Results  Component Value Date   CHOL 224 (H) 04/13/2016   Lab Results  Component Value Date   HDL 82.90 04/13/2016   Lab Results  Component Value Date   LDLCALC 123 (H) 04/13/2016   Lab Results  Component Value Date   TRIG 87.0 04/13/2016   Lab Results  Component Value Date   CHOLHDL 3 04/13/2016   Lab Results  Component Value Date   HGBA1C 5.4 09/19/2014       Assessment & Plan:   Switching from Zantac to Protonix 40mg  daily  Problem List Items Addressed This Visit      Unprioritized   B12 deficiency - Primary   Relevant Medications   cyanocobalamin  ((VITAMIN B-12)) injection 1,000 mcg (Completed)   Gastroesophageal reflux disease without esophagitis    protonix daily May need GI  Handout given to pt       Relevant Medications   pantoprazole (PROTONIX) 40 MG tablet   HTN (hypertension)    Well controlled, no changes to meds. Encouraged heart healthy diet such as the DASH diet and exercise as tolerated.       Relevant Orders   Comprehensive metabolic panel   Hyperlipidemia LDL goal <100    Con't lipitor Check labs      Relevant Orders   Lipid panel   Hypothyroidism    Check labs  con't synthroid      Relevant Orders   TSH      I have discontinued Ms. Seneca's meclizine and ranitidine. I am also having her start on pantoprazole. Additionally, I am having her maintain her sertraline, lamoTRIgine, cyanocobalamin, pramipexole, levothyroxine, nebivolol, atorvastatin, and ARIPiprazole. We administered cyanocobalamin.  Meds ordered this encounter  Medications  . cyanocobalamin ((VITAMIN B-12)) injection 1,000 mcg  . pantoprazole (PROTONIX) 40 MG tablet    Sig: Take 1 tablet (40 mg total) by mouth daily.    Dispense:  30 tablet    Refill:  2    CMA served as scribe during this visit. History, Physical and Plan performed by medical provider. Documentation and orders reviewed and attested to.  Donato Schultz, DO

## 2016-07-20 NOTE — Assessment & Plan Note (Signed)
Cont lipitor. Check labs.

## 2016-07-20 NOTE — Progress Notes (Signed)
Pre visit review using our clinic review tool, if applicable. No additional management support is needed unless otherwise documented below in the visit note. 

## 2016-07-20 NOTE — Assessment & Plan Note (Signed)
Well controlled, no changes to meds. Encouraged heart healthy diet such as the DASH diet and exercise as tolerated.  °

## 2016-07-20 NOTE — Assessment & Plan Note (Signed)
protonix daily May need GI  Handout given to pt

## 2016-07-20 NOTE — Telephone Encounter (Signed)
Spoke to patient. Colpo rescheduled to 08-02-16 at 4pm. Apologized for miscommunication.    Routing to provider for final review. Patient agreeable to disposition. Will close encounter.

## 2016-07-20 NOTE — Telephone Encounter (Signed)
Patient canceled her upcoming appointment 08/02/16 for a colposcopy. Patient did not wish to reschedule. Patient stated that she plans to seek care elsewhere.

## 2016-07-20 NOTE — Telephone Encounter (Signed)
Patient declined to keep appointment today. Patient to call back to reschedule  Routing to Dr Oscar LaJertson  cc: Soledad GerlachAndrea Alexander  cc: Billie RuddySally Yeakley

## 2016-07-21 DIAGNOSIS — F4310 Post-traumatic stress disorder, unspecified: Secondary | ICD-10-CM | POA: Diagnosis not present

## 2016-07-21 DIAGNOSIS — F3160 Bipolar disorder, current episode mixed, unspecified: Secondary | ICD-10-CM | POA: Diagnosis not present

## 2016-07-21 DIAGNOSIS — E785 Hyperlipidemia, unspecified: Secondary | ICD-10-CM | POA: Diagnosis not present

## 2016-07-21 DIAGNOSIS — E039 Hypothyroidism, unspecified: Secondary | ICD-10-CM | POA: Diagnosis not present

## 2016-07-21 DIAGNOSIS — R45851 Suicidal ideations: Secondary | ICD-10-CM | POA: Diagnosis not present

## 2016-07-21 DIAGNOSIS — I10 Essential (primary) hypertension: Secondary | ICD-10-CM | POA: Diagnosis not present

## 2016-07-21 DIAGNOSIS — Z590 Homelessness: Secondary | ICD-10-CM | POA: Diagnosis not present

## 2016-07-21 DIAGNOSIS — K219 Gastro-esophageal reflux disease without esophagitis: Secondary | ICD-10-CM | POA: Diagnosis not present

## 2016-07-21 DIAGNOSIS — Z62810 Personal history of physical and sexual abuse in childhood: Secondary | ICD-10-CM | POA: Diagnosis not present

## 2016-07-21 DIAGNOSIS — E538 Deficiency of other specified B group vitamins: Secondary | ICD-10-CM | POA: Diagnosis not present

## 2016-07-21 DIAGNOSIS — F314 Bipolar disorder, current episode depressed, severe, without psychotic features: Secondary | ICD-10-CM | POA: Diagnosis not present

## 2016-07-22 DIAGNOSIS — E538 Deficiency of other specified B group vitamins: Secondary | ICD-10-CM | POA: Diagnosis not present

## 2016-07-22 MED ORDER — CYANOCOBALAMIN 1000 MCG/ML IJ SOLN
1000.0000 ug | Freq: Once | INTRAMUSCULAR | Status: AC
  Administered 2016-07-22: 1000 ug via INTRAMUSCULAR

## 2016-07-22 NOTE — Telephone Encounter (Signed)
Patient arrived for appointment on 07-20-16 and had issue with business office. (See account notes)  I spoke to her late in day on 07-20-16 and rescheduled the appointment to 08-02-16 based on her financial and scheduling needs.  She has since called and canceled appointment.

## 2016-07-22 NOTE — Addendum Note (Signed)
Addended by: Thelma BargeICHARDSON, Jaquon Gingerich D on: 07/22/2016 09:54 AM   Modules accepted: Orders

## 2016-07-22 NOTE — Telephone Encounter (Signed)
Kennon RoundsSally, can you please call the patient and figure out what happened. Thanks

## 2016-07-27 ENCOUNTER — Telehealth: Payer: Self-pay | Admitting: Genetics

## 2016-07-27 ENCOUNTER — Encounter: Payer: Self-pay | Admitting: Genetics

## 2016-07-27 DIAGNOSIS — F4310 Post-traumatic stress disorder, unspecified: Secondary | ICD-10-CM | POA: Diagnosis not present

## 2016-07-27 DIAGNOSIS — F3160 Bipolar disorder, current episode mixed, unspecified: Secondary | ICD-10-CM | POA: Diagnosis not present

## 2016-07-27 DIAGNOSIS — F314 Bipolar disorder, current episode depressed, severe, without psychotic features: Secondary | ICD-10-CM | POA: Diagnosis not present

## 2016-07-27 NOTE — Telephone Encounter (Signed)
Pt returned my call to schedule a Dentistgenetic counselor.Scheduled to see Tobi Bastosnna on 3/29 at 9am. Pt aware to arrive 15 minutes early. Demographics verified. Letter mailed.

## 2016-07-28 DIAGNOSIS — F3160 Bipolar disorder, current episode mixed, unspecified: Secondary | ICD-10-CM | POA: Diagnosis not present

## 2016-07-29 DIAGNOSIS — F3160 Bipolar disorder, current episode mixed, unspecified: Secondary | ICD-10-CM | POA: Diagnosis not present

## 2016-07-29 DIAGNOSIS — F314 Bipolar disorder, current episode depressed, severe, without psychotic features: Secondary | ICD-10-CM | POA: Diagnosis not present

## 2016-07-30 ENCOUNTER — Telehealth: Payer: Self-pay | Admitting: Family Medicine

## 2016-08-02 ENCOUNTER — Ambulatory Visit: Payer: 59 | Admitting: Obstetrics and Gynecology

## 2016-08-02 DIAGNOSIS — R87619 Unspecified abnormal cytological findings in specimens from cervix uteri: Secondary | ICD-10-CM | POA: Diagnosis not present

## 2016-08-02 DIAGNOSIS — F3160 Bipolar disorder, current episode mixed, unspecified: Secondary | ICD-10-CM | POA: Diagnosis not present

## 2016-08-02 DIAGNOSIS — F314 Bipolar disorder, current episode depressed, severe, without psychotic features: Secondary | ICD-10-CM | POA: Diagnosis not present

## 2016-08-03 DIAGNOSIS — F3160 Bipolar disorder, current episode mixed, unspecified: Secondary | ICD-10-CM | POA: Diagnosis not present

## 2016-08-04 ENCOUNTER — Encounter: Payer: Self-pay | Admitting: Genetics

## 2016-08-04 DIAGNOSIS — F3160 Bipolar disorder, current episode mixed, unspecified: Secondary | ICD-10-CM | POA: Diagnosis not present

## 2016-08-04 NOTE — Telephone Encounter (Signed)
error:[id number] ° °

## 2016-08-05 ENCOUNTER — Encounter: Payer: Self-pay | Admitting: Genetic Counselor

## 2016-08-05 ENCOUNTER — Ambulatory Visit (HOSPITAL_BASED_OUTPATIENT_CLINIC_OR_DEPARTMENT_OTHER): Payer: 59 | Admitting: Genetics

## 2016-08-05 ENCOUNTER — Telehealth: Payer: Self-pay | Admitting: Family Medicine

## 2016-08-05 ENCOUNTER — Encounter: Payer: Self-pay | Admitting: Genetics

## 2016-08-05 ENCOUNTER — Other Ambulatory Visit: Payer: 59

## 2016-08-05 ENCOUNTER — Other Ambulatory Visit (INDEPENDENT_AMBULATORY_CARE_PROVIDER_SITE_OTHER): Payer: 59

## 2016-08-05 DIAGNOSIS — E039 Hypothyroidism, unspecified: Secondary | ICD-10-CM | POA: Diagnosis not present

## 2016-08-05 DIAGNOSIS — Z7183 Encounter for nonprocreative genetic counseling: Secondary | ICD-10-CM | POA: Diagnosis not present

## 2016-08-05 DIAGNOSIS — Z8041 Family history of malignant neoplasm of ovary: Secondary | ICD-10-CM | POA: Diagnosis not present

## 2016-08-05 DIAGNOSIS — Z803 Family history of malignant neoplasm of breast: Secondary | ICD-10-CM

## 2016-08-05 DIAGNOSIS — E785 Hyperlipidemia, unspecified: Secondary | ICD-10-CM | POA: Diagnosis not present

## 2016-08-05 DIAGNOSIS — I10 Essential (primary) hypertension: Secondary | ICD-10-CM

## 2016-08-05 DIAGNOSIS — K219 Gastro-esophageal reflux disease without esophagitis: Secondary | ICD-10-CM

## 2016-08-05 LAB — COMPREHENSIVE METABOLIC PANEL
ALK PHOS: 100 U/L (ref 33–130)
ALT: 28 U/L (ref 6–29)
AST: 28 U/L (ref 10–35)
Albumin: 4 g/dL (ref 3.6–5.1)
BUN: 16 mg/dL (ref 7–25)
CALCIUM: 9.7 mg/dL (ref 8.6–10.4)
CHLORIDE: 102 mmol/L (ref 98–110)
CO2: 25 mmol/L (ref 20–31)
Creat: 0.97 mg/dL (ref 0.50–1.05)
GLUCOSE: 96 mg/dL (ref 65–99)
POTASSIUM: 4 mmol/L (ref 3.5–5.3)
Sodium: 140 mmol/L (ref 135–146)
Total Bilirubin: 0.9 mg/dL (ref 0.2–1.2)
Total Protein: 6.9 g/dL (ref 6.1–8.1)

## 2016-08-05 LAB — LIPID PANEL
CHOL/HDL RATIO: 1.5 ratio (ref <5.0)
CHOLESTEROL: 151 mg/dL (ref <200)
HDL: 102 mg/dL (ref >50)
LDL Cholesterol: 32 mg/dL (ref <100)
Triglycerides: 84 mg/dL (ref <150)
VLDL: 17 mg/dL (ref <30)

## 2016-08-05 LAB — TSH: TSH: 0.61 m[IU]/L

## 2016-08-05 MED ORDER — PANTOPRAZOLE SODIUM 40 MG PO TBEC: 40.0000 mg | DELAYED_RELEASE_TABLET | Freq: Every day | ORAL | 1 refills | Status: DC

## 2016-08-05 MED ORDER — PRAMIPEXOLE DIHYDROCHLORIDE 1 MG PO TABS: 1.0000 mg | ORAL_TABLET | Freq: Every day | ORAL | 1 refills | Status: DC

## 2016-08-05 MED ORDER — LEVOTHYROXINE SODIUM 75 MCG PO TABS: 75.0000 ug | ORAL_TABLET | Freq: Every day | ORAL | 3 refills | Status: DC

## 2016-08-05 MED ORDER — NEBIVOLOL HCL 5 MG PO TABS: 5.0000 mg | ORAL_TABLET | Freq: Every day | ORAL | 1 refills | Status: DC

## 2016-08-05 MED ORDER — ATORVASTATIN CALCIUM 20 MG PO TABS: 20.0000 mg | ORAL_TABLET | Freq: Every day | ORAL | 1 refills | Status: DC

## 2016-08-05 NOTE — Telephone Encounter (Signed)
Sent in prescriptions she requested to mail order. Did add abilify to allergy list. PCP advise her to stop the zantac at her last appt. And take pantoprazole She verbalized understanding.  She stated to remove abilify from her list and add to allergy list due to causing dyskinesia.

## 2016-08-05 NOTE — Telephone Encounter (Signed)
Relation to ZO:XWRUpt:self Call back number:[phone] [facility] [place] - [address] [phone] (Phone) [phone] (Fax)     Reason for call:  Patient requesting a 90 day supply  pantoprazole (PROTONIX) 40 MG tablet,  atorvastatin (LIPITOR) 20 MG tablet,  nebivolol (BYSTOLIC) 5 MG tablet,  levothyroxine (SYNTHROID, LEVOTHROID) 75 MCG tablet,  pramipexole (MIRAPEX) 1 MG tablet    Patient in need of clarification regarding ranitidine (ZANTAC) 300 MG capsule, patient thought PCP advised her to take with protonix, please advise  Please remove ARIPiprazole (ABILIFY) 10 MG tablet as per patient she's allergic

## 2016-08-05 NOTE — Progress Notes (Signed)
REFERRING PROVIDER: Ann Held, DO [address] [place], [place] [address]  PRIMARY PROVIDER:  Ann Held, DO  PRIMARY REASON FOR VISIT:  1. Family history of breast cancer   2. Family history of ovarian cancer     HISTORY OF PRESENT ILLNESS:   [name], a 51 y.o. female, was seen for a [place] cancer genetics consultation at the request of Dr. Carollee Herter due to a family history of cancer.  Janet Turner presents to clinic today to discuss the possibility of a hereditary predisposition to cancer, genetic testing, and to further clarify her future cancer risks, as well as potential cancer risks for family members.    Janet Turner is a 51 y.o. female with no personal history of cancer.    CANCER HISTORY:   No history exists.    HORMONAL RISK FACTORS:  Menarche was at age 58.  First live birth at age 52.  OCP use for approximately 0 years.  Ovaries intact: yes.  Hysterectomy: no.  Menopausal status: postmenopausal.  HRT use: 0 years. Colonoscopy: no; not examined. Mammogram within the last year: yes. Number of breast biopsies: 0. Up to date with pelvic exams:  yes. Any excessive radiation exposure in the past:  no  Past Medical History:  Diagnosis Date  . Abnormal Pap smear of cervix 2013, 2015   positive HR HPV  . Amenorrhea   . Chicken pox   . Complication of anesthesia    oxygen level  and blood pressure dropped wtih anterior cervical fusion surgery   . Depression   . Family history of adverse reaction to anesthesia    mother- nausea and vomiting   . Gastritis    chronic, inactive  . GERD (gastroesophageal reflux disease)   . Heart murmur   . Hyperlipemia   . Hypertension   . Hypoglycemia   . Hypopituitarism ([place])   . Hypothyroidism   . Infertility, female   . Leg pain   . PTSD (post-traumatic stress disorder)   . Sleep apnea    no cpap     Past Surgical History:  Procedure Laterality Date  . ANTERIOR CERVICAL  DISCECTOMY  2013   Ant Cervical Fusion C4-5 with diskectomy  . BREAST REDUCTION SURGERY  96  . CESAREAN SECTION  91  . CHOLECYSTECTOMY  97  . EYE SURGERY  [phone]   x's 3   . ganglion cyst removed    . LAPAROSCOPIC GASTRIC SLEEVE RESECTION N/A 05/28/2014   Procedure: LAPAROSCOPIC GASTRIC SLEEVE RESECTION;  Surgeon: Pedro Earls, MD;  Location: [facility];  Service: General;  Laterality: N/A;  . UPPER GI ENDOSCOPY N/A 05/28/2014   Procedure: UPPER GI ENDOSCOPY;  Surgeon: Pedro Earls, MD;  Location: [facility];  Service: General;  Laterality: N/A;    Social History   Social History  . Marital status: Married    Spouse name: N/A  . Number of children: N/A  . Years of education: N/A   Social History Main Topics  . Smoking status: Never Smoker  . Smokeless tobacco: Never Used  . Alcohol use No  . Drug use: No  . Sexual activity: Yes    Partners: Male    Birth control/ protection: Post-menopausal   Other Topics Concern  . Not on file   Social History Narrative  . No narrative on file     FAMILY HISTORY:  We obtained a detailed, 4-generation family history.  Significant diagnoses are listed  below: Family History  Problem Relation Age of Onset  . Osteoarthritis Mother   . Breast cancer Mother 29    treated with bilateral mastectomies, chemo, radiation  . Other Mother 1    reported to have positive genetic testing  . Factor V Leiden deficiency Mother   . Hypertension Father   . Melanoma Father   . Depression Father     PTSD  . Cancer Father 8    melanoma; recurred and metastasized at 55  . Alcoholism    . Stroke      Paternal Family--9/12 children after age 24  . Anuerysm      Paternal family  . Hypertension      Paternal family  . Ovarian cancer Maternal Grandmother 20    treated with surgery and chemo  . Lung cancer Maternal Grandmother 73    h/o smoking  . Breast cancer Maternal Grandmother 50    mastectomy.recurence age 37 on the other,  mastectomy.treated with chemo and RX  . Colon polyps Maternal Grandmother     polyposis since middle-age  . Other Maternal Grandmother 50    abdominal desmoid tumor-no surgery due to blood vessel involvement  . Prostate cancer Paternal Grandfather 36  . Factor V Leiden deficiency Sister   . Aneurysm Paternal Aunt 103    brain  . Aneurysm Maternal Grandfather 69    abdominal and cause of death  . Aneurysm Paternal Grandmother 45    brain   Janet Turner has one biological son who is 38 without cancers. She also has an adopted daughter who is 63. Janet Turner has one full-sister, age 2, with no history of cancer. Her sister does have Factor V Leiden deficiency. Janet Turner mother, Louis Meckel, is currently 58 and has a history of breast cancer diagnosed at age 67. Shirley's breast cancer treatment included bilateral mastectomies, chemotherapy, and radiation therapy. Janet Turner reported that her mother previously underwent genetic testing for hereditary cancers syndromes. Janet Turner thinks her mother tested negative for BRCA1 and BRCA2 mutations, but tested positive for a mutation in another gene. A copy of her mother's genetic testing result was not available for review at the time of our meeting. Janet Turner also has Factor V Leiden deficiency. Janet Turner has one full-brother and one full-sister (ages 66 and 59) without cancers.   Janet Turner maternal grandmother died at age 57 and had a history of multiple cancers. She was first diagnosed with ovarian cancer at age 42 and was treated with surgery and chemotherapy. She was then diagnosed with an inoperable abdominal desmoid tumor at age 75. Also at age 12, she was diagnosed with breast cancer. Treatment included mastectomy. At 56, she was diagnosed with cancer in her contralateral breast and underwent mastectomy of that breast as well.  At age 67, she was diagnosed with lung cancer. Upon questioning Janet Turner regarding whether her  maternal grandmother had colon polyps, she reported that her grandmother did have polyposis from at least the time of middle-age. No one else in the family is known to have polyposis. Janet Turner maternal grandmother was an only child. Her father died of lung cancer at 28. Her mother died of heart problems at 51. Ms. Laprise maternal grandfather died of an abdominal aneurysm at age 18.  Ms. Salsberry father died of metastatic melanoma at age 18. He was first diagnosed with melanoma of his forearm at age 6. Ms. Overacker father had 4 full-sisters and 7 full-brothers. Two sisters are still living at  80 and 73 without cancers. Many of his other siblings died of brain aneurysms. Ms. Grumbine paternal grandmother also died of a brain aneurysm at age 54. This grandmother had a sister who died of melanoma and some other siblings with unspecified forms of cancer. Ms. Smeltz paternal grandfather died in his 11s with prostate cancer. His mother had an unspecified form of cancer.  Patient's maternal ancestors are of [place] descent, and paternal ancestors are of [place] descent. There is no reported Ashkenazi Jewish ancestry. There is no known consanguinity.  Ms. Hedeen reported that she has been tested for Factor V Leiden deficiency and does not have it. She also expressed interest in meeting with Medical Genetics to discuss her family history of and genetic testing for aneurysms. I provided contact information for the Medical Genetics department at [facility]. She plans to call them for an appointment.  GENETIC COUNSELING ASSESSMENT: Justis Dupas is a 51 y.o. female with a family history which is somewhat suggestive of hereditary cancer syndromes and predisposition to cancer. We, therefore, discussed and recommended the following at today's visit.   DISCUSSION: We reviewed the characteristics, features and inheritance patterns of hereditary cancer syndromes. We  also discussed genetic testing, including the appropriate family members to test, the process of testing, insurance coverage and turn-around-time for results. We discussed the implications of a negative, positive and/or variant of uncertain significant result. We recommended Ms. Meriwether obtain a copy of her mother's genetic testing report and return to clinic for her own testing once it is obtained.   Based on Ms. Ibe's family history of cancer, she meets medical criteria for genetic testing. Despite that she meets criteria, she may still have an out of pocket cost. We discussed that if her out of pocket cost for testing is over $100, the laboratory will call and confirm whether she wants to proceed with testing.  If the out of pocket cost of testing is less than $100 she will be billed by the genetic testing laboratory.    PLAN:  Based on Ms. Schwebach's family history, we recommended she obtain a copy of her mother's genetic testing results and return to clinic for her own testing once she obtains her mother's records. Ms. Aranas plans to fax her mother's results to me once she has a copy. Once I receive her mother's results, I will call Ms. Firestine to schedule a time for her blood draw and answer any remaining questions.  Lastly, we encouraged Ms. Punt to remain in contact with cancer genetics annually so that we can continuously update the family history and inform her of any changes in cancer genetics and testing that may be of benefit for this family.   Ms.  Tupou questions were answered to her satisfaction today. Our contact information was provided should additional questions or concerns arise. Thank you for the referral and allowing [place] to share in the care of your patient.   Mal Misty, MS, [facility] Certified Naval [email or web address] phone: [phone]  The patient was seen for a total of 40 minutes in face-to-face genetic counseling.  This patient was  discussed with Drs. Magrinat, Lindi Adie and/or Burr Medico who agrees with the above.    _______________________________________________________________________ For Office Staff:  Number of people involved in session: 1 Was an Intern/ student involved with case: no   [facility] (IBIS) Breast Cancer Risk Model Summary   [facility] Breast Cancer Risk Model Summary

## 2016-08-06 DIAGNOSIS — F3160 Bipolar disorder, current episode mixed, unspecified: Secondary | ICD-10-CM | POA: Diagnosis not present

## 2016-08-09 ENCOUNTER — Other Ambulatory Visit: Payer: Self-pay | Admitting: Family Medicine

## 2016-08-09 DIAGNOSIS — F3160 Bipolar disorder, current episode mixed, unspecified: Secondary | ICD-10-CM | POA: Diagnosis not present

## 2016-08-09 DIAGNOSIS — K219 Gastro-esophageal reflux disease without esophagitis: Secondary | ICD-10-CM

## 2016-08-09 MED FILL — LEVOTHYROXINE 75 MCG TABLET: 75 | 30 days supply | Qty: 30 | Fill #0 | Status: TO

## 2016-08-10 ENCOUNTER — Telehealth: Payer: Self-pay

## 2016-08-10 ENCOUNTER — Encounter: Payer: Self-pay | Admitting: Internal Medicine

## 2016-08-10 ENCOUNTER — Ambulatory Visit: Payer: 59 | Admitting: Internal Medicine

## 2016-08-10 ENCOUNTER — Telehealth: Payer: Self-pay | Admitting: Family Medicine

## 2016-08-10 DIAGNOSIS — H5005 Alternating esotropia: Secondary | ICD-10-CM | POA: Diagnosis not present

## 2016-08-10 MED ORDER — LISINOPRIL 10 MG PO TABS: 10.0000 mg | ORAL_TABLET | Freq: Every day | ORAL | 3 refills | Status: DC

## 2016-08-10 MED FILL — ATORVASTATIN 20 MG TABLET: 20 | 90 days supply | Qty: 90 | Fill #0

## 2016-08-10 MED FILL — PRAMIPEXOLE 1 MG TABLET: 1 | 30 days supply | Qty: 30 | Fill #0

## 2016-08-10 NOTE — Telephone Encounter (Signed)
Caller name: Relationship to patient: Self Can be reached: [phone]  Pharmacy:  [facility] - [place], [place] - [address]. [phone] (Phone) [phone] (Fax)     Reason for call: Request a Rx for Lisiniprol because the co-pay for Bystolic is too much.

## 2016-08-10 NOTE — Telephone Encounter (Signed)
Patient would like to request a Rx for Lisiniprol because the co-pay for Bystolic is too much. LB

## 2016-08-10 NOTE — Telephone Encounter (Signed)
Called the patient left a message to call back. Update medication list/sent in lisinopril to [facility].

## 2016-08-10 NOTE — Telephone Encounter (Signed)
Ok to change to lisinopril 10 mg but needs ov 2-3 weeks to check bp

## 2016-08-10 NOTE — Telephone Encounter (Signed)
Patient was here to be seen for an appointment today but her husband needed to be seen instead.  Patient is given a work note because she was here with her husband today.  Gm  Per Dr. Rhea Belton, okay to give note. gm

## 2016-08-11 DIAGNOSIS — F3160 Bipolar disorder, current episode mixed, unspecified: Secondary | ICD-10-CM | POA: Diagnosis not present

## 2016-08-11 DIAGNOSIS — F314 Bipolar disorder, current episode depressed, severe, without psychotic features: Secondary | ICD-10-CM | POA: Diagnosis not present

## 2016-08-11 MED FILL — SERTRALINE HCL 100 MG TAB: 100 | 30 days supply | Qty: 45 | Fill #0

## 2016-08-11 MED FILL — LISINOPRIL 10 MG TABLET: 10 | 90 days supply | Qty: 90 | Fill #0

## 2016-08-12 ENCOUNTER — Ambulatory Visit (AMBULATORY_SURGERY_CENTER): Payer: Self-pay | Admitting: *Deleted

## 2016-08-12 VITALS — Ht <= 58 in | Wt 137.0 lb

## 2016-08-12 DIAGNOSIS — Z1211 Encounter for screening for malignant neoplasm of colon: Secondary | ICD-10-CM

## 2016-08-12 DIAGNOSIS — F3160 Bipolar disorder, current episode mixed, unspecified: Secondary | ICD-10-CM | POA: Diagnosis not present

## 2016-08-12 MED ORDER — NA SULFATE-K SULFATE-MG SULF 17.5-3.13-1.6 GM/177ML PO SOLN: 1.0000 | Freq: Once | ORAL | 0 refills | Status: AC

## 2016-08-12 MED FILL — SUPREP BOWEL PREP KIT: 17.5-3.13-1 | 1 days supply | Qty: 354 | Fill #0

## 2016-08-12 NOTE — Telephone Encounter (Signed)
Contacted the patient informed of medication at pharmacy. She scheduled a OV/check bp the first of may 2018.

## 2016-08-12 NOTE — Progress Notes (Signed)
No egg or soy allergy known to patient  issues with past sedation with any surgeries of cerviacl laminectomy pts 02 and bp dropped , no other issues with sedation   or procedures, no intubation problems  No diet pills per patient No home 02 use per patient  No blood thinners per patient  Pt denies issues with constipation  No A fib or A flutter  Pt alternates diarrhea and constipation- since gastric sleeve  emmi video to e mail  15 $ coupon to pt for prep

## 2016-08-13 ENCOUNTER — Encounter: Payer: Self-pay | Admitting: Internal Medicine

## 2016-08-13 DIAGNOSIS — F3160 Bipolar disorder, current episode mixed, unspecified: Secondary | ICD-10-CM | POA: Diagnosis not present

## 2016-08-16 DIAGNOSIS — F3160 Bipolar disorder, current episode mixed, unspecified: Secondary | ICD-10-CM | POA: Diagnosis not present

## 2016-08-17 ENCOUNTER — Ambulatory Visit: Payer: Self-pay

## 2016-08-19 ENCOUNTER — Telehealth: Payer: Self-pay | Admitting: Genetics

## 2016-08-20 DIAGNOSIS — F3160 Bipolar disorder, current episode mixed, unspecified: Secondary | ICD-10-CM | POA: Diagnosis not present

## 2016-08-20 NOTE — Progress Notes (Signed)
Janet Turner called to say that she would like to move forward with genetic testing for herself. She and her mother have been unable to locate her mother's genetic testing results. Janet Turner is scheduled for a blood draw on 08/23/2016 at 3PM. Testing will be sent to [facility] for analysis of the 46-gene Common Hereditary Cancer Panel. Results are expected within 3 weeks from the time of the blood draw. I will call Janet Turner with results once they are available.

## 2016-08-20 NOTE — Telephone Encounter (Signed)
Okay to close encounter?  °

## 2016-08-20 NOTE — Telephone Encounter (Signed)
Janet Turner would like to proceed with genetic testing. She is scheduled for a blood draw on 08/23/2016 at 3PM.

## 2016-08-23 ENCOUNTER — Other Ambulatory Visit: Payer: 59

## 2016-08-23 ENCOUNTER — Encounter: Payer: Self-pay | Admitting: Internal Medicine

## 2016-08-23 DIAGNOSIS — Z803 Family history of malignant neoplasm of breast: Secondary | ICD-10-CM | POA: Diagnosis not present

## 2016-08-23 DIAGNOSIS — F3160 Bipolar disorder, current episode mixed, unspecified: Secondary | ICD-10-CM | POA: Diagnosis not present

## 2016-08-23 DIAGNOSIS — Z8041 Family history of malignant neoplasm of ovary: Secondary | ICD-10-CM | POA: Diagnosis not present

## 2016-08-23 DIAGNOSIS — Z8371 Family history of colonic polyps: Secondary | ICD-10-CM | POA: Diagnosis not present

## 2016-08-23 MED FILL — PANTOPRAZOLE SOD DR 40 MG T: 40 | 90 days supply | Qty: 90 | Fill #0 | Status: TO

## 2016-08-23 NOTE — Telephone Encounter (Signed)
Close encounter 

## 2016-08-24 DIAGNOSIS — F3160 Bipolar disorder, current episode mixed, unspecified: Secondary | ICD-10-CM | POA: Diagnosis not present

## 2016-08-24 DIAGNOSIS — F3175 Bipolar disorder, in partial remission, most recent episode depressed: Secondary | ICD-10-CM | POA: Diagnosis not present

## 2016-08-27 DIAGNOSIS — F3160 Bipolar disorder, current episode mixed, unspecified: Secondary | ICD-10-CM | POA: Diagnosis not present

## 2016-08-31 DIAGNOSIS — F3160 Bipolar disorder, current episode mixed, unspecified: Secondary | ICD-10-CM | POA: Diagnosis not present

## 2016-09-01 DIAGNOSIS — F3160 Bipolar disorder, current episode mixed, unspecified: Secondary | ICD-10-CM | POA: Diagnosis not present

## 2016-09-02 DIAGNOSIS — F3160 Bipolar disorder, current episode mixed, unspecified: Secondary | ICD-10-CM | POA: Diagnosis not present

## 2016-09-03 DIAGNOSIS — F3160 Bipolar disorder, current episode mixed, unspecified: Secondary | ICD-10-CM | POA: Diagnosis not present

## 2016-09-06 ENCOUNTER — Telehealth: Payer: Self-pay | Admitting: Genetics

## 2016-09-06 ENCOUNTER — Encounter: Payer: Self-pay | Admitting: Genetics

## 2016-09-06 ENCOUNTER — Ambulatory Visit: Payer: Self-pay | Admitting: Genetics

## 2016-09-06 DIAGNOSIS — Z1379 Encounter for other screening for genetic and chromosomal anomalies: Secondary | ICD-10-CM

## 2016-09-06 HISTORY — DX: Encounter for other screening for genetic and chromosomal anomalies: Z13.79

## 2016-09-06 NOTE — Telephone Encounter (Signed)
Reviewed that germline genetic testing revealed no pathogenic mutations. This is considered to be a negative result. Testing was performed through [facility]'s 46-gene Common Hereditary Cancers Panel. [facility] Common Hereditary Cancers Panel includes analysis of the following 46 genes: APC, ATM, AXIN2, BARD1, BMPR1A, BRCA1, BRCA2, BRIP1, CDH1, CDKN2A, CHEK2, CTNNA1, DICER1, EPCAM, GREM1, HOXB13, KIT, MEN1, MLH1, MSH2, MSH3, MSH6, MUTYH, NBN, NF1, NTHL1, PALB2, PDGFRA, PMS2, POLD1, POLE, PTEN, RAD50, RAD51C, RAD51D, SDHA, SDHB, SDHC, SDHD, SMAD4, SMARCA4, STK11, TP53, TSC1, TSC2, and VHL.  A variant of uncertain significance (VUS) was noted in ATM. The specific ATM variant is [id number] ([id number]). Discussed that this VUS should not change clinical management.  For more detailed discussion, please see genetic counseling documentation from 09/06/2016. Result report dated 09/01/2016.

## 2016-09-06 NOTE — Progress Notes (Signed)
HPI: Ms. Fein was previously seen in the [facility] due to a family history of cancer and concerns regarding a hereditary predisposition to cancer. Please refer to our prior cancer genetics clinic note for more information regarding Ms. Philbrick's medical, social and family histories, and our assessment and recommendations, at the time.  At the time of her initial appointment, Ms. Bacha declined genetic testing to gather more information about her mother's genetic testing results first. On 08/23/2016, she returned to clinic for a blood draw to pursue genetic testing through [facility]'s 46-gene Common Hereditary Cancers Panel. She was not able to obtain a copy of her mother's genetic testing results in the interim. We reviewed the significant limitations of testing without documentation of her mother's results. Ms. Winterton expressed understanding of these limitations.  Ms. Chandra recent genetic test results were disclosed to her, as were recommendations warranted by these results. These results and recommendations are discussed in more detail below.  CANCER HISTORY:   No history exists.     FAMILY HISTORY:  We obtained a detailed, 4-generation family history.  Significant diagnoses are listed below: Family History  Problem Relation Age of Onset  . Osteoarthritis Mother   . Breast cancer Mother 8    treated with bilateral mastectomies, chemo, radiation  . Other Mother 60    reported to have positive genetic testing  . Factor V Leiden deficiency Mother   . Hypertension Father   . Melanoma Father   . Depression Father     PTSD  . Cancer Father 67    melanoma; recurred and metastasized at 28  . Alcoholism    . Stroke      Paternal Family--9/12 children after age 19  . Anuerysm      Paternal family  . Hypertension      Paternal family  . Ovarian cancer Maternal Grandmother 41    treated with surgery and chemo  . Lung cancer Maternal Grandmother 73      h/o smoking  . Breast cancer Maternal Grandmother 50    mastectomy.recurence age 62 on the other, mastectomy.treated with chemo and RX  . Colon polyps Maternal Grandmother     polyposis since middle-age  . Other Maternal Grandmother 50    abdominal desmoid tumor-no surgery due to blood vessel involvement  . Prostate cancer Paternal Grandfather 71  . Factor V Leiden deficiency Sister   . Aneurysm Paternal Aunt 30    brain  . Aneurysm Maternal Grandfather 69    abdominal and cause of death  . Aneurysm Paternal Grandmother 5    brain  . Colon cancer Neg Hx   . Esophageal cancer Neg Hx   . Rectal cancer Neg Hx   . Stomach cancer Neg Hx    Ms. Zaffino has one biological son who is 16 without cancers. She also has an adopted daughter who is 50. Ms. Mccarrell has one full-sister, age 36, with no history of cancer. Her sister does have Factor V Leiden deficiency. Ms. Lopata mother, Louis Meckel, is currently 10 and has a history of breast cancer diagnosed at age 29. Shirley's breast cancer treatment included bilateral mastectomies, chemotherapy, and radiation therapy. Ms. Callas reported that her mother previously underwent genetic testing for hereditary cancers syndromes. Ms. Slingerland thinks her mother tested negative for BRCA1 and BRCA2 mutations, but tested positive for a mutation in another gene. A copy of her mother's genetic testing result was not available for review at the time of our meeting.  [name] also has Factor V Leiden deficiency. [name] has one full-brother and one full-sister (ages 20 and 27) without cancers.   Ms. Steele maternal grandmother died at age 77 and had a history of multiple cancers. She was first diagnosed with ovarian cancer at age 49 and was treated with surgery and chemotherapy. She was then diagnosed with an inoperable abdominal desmoid tumor at age 23. Also at age 7, she was diagnosed with breast cancer. Treatment included mastectomy. At  68, she was diagnosed with cancer in her contralateral breast and underwent mastectomy of that breast as well.  At age 78, she was diagnosed with lung cancer. Upon questioning Ms. [place] regarding whether her maternal grandmother had colon polyps, she reported that her grandmother did have polyposis from at least the time of middle-age. No one else in the family is known to have polyposis. Ms. Corriher maternal grandmother was an only child. Her father died of lung cancer at 15. Her mother died of heart problems at 44. Ms. Spearman maternal grandfather died of an abdominal aneurysm at age 74.  Ms. Mares father died of metastatic melanoma at age 35. He was first diagnosed with melanoma of his forearm at age 24. Ms. Doty father had 4 full-sisters and 7 full-brothers. Two sisters are still living at 96 and 15 without cancers. Many of his other siblings died of brain aneurysms. Ms. Isley paternal grandmother also died of a brain aneurysm at age 54. This grandmother had a sister who died of melanoma and some other siblings with unspecified forms of cancer. Ms. Proud paternal grandfather died in his 2s with prostate cancer. His mother had an unspecified form of cancer.  Patient's maternal ancestors are of [place] descent, and paternal ancestors are of [place] descent. There is no reported Ashkenazi Jewish ancestry. There is no known consanguinity.  Ms. Gotto reported that she has been tested for Factor V Leiden deficiency and does not have it. She also expressed interest in meeting with Medical Genetics to discuss her family history of and genetic testing for aneurysms. I provided contact information for the Medical Genetics department at [facility]. She plans to call them for an appointment.  GENETIC TEST RESULTS: Genetic testing performed through [facility]'s Common Hereditary Cancers Panel reported out on 09/01/2016 showed no deleterious mutations.  [facility] Common Hereditary Cancers Panel includes analysis of the following 46 genes: APC, ATM, AXIN2, BARD1, BMPR1A, BRCA1, BRCA2, BRIP1, CDH1, CDKN2A, CHEK2, CTNNA1, DICER1, EPCAM, GREM1, HOXB13, KIT, MEN1, MLH1, MSH2, MSH3, MSH6, MUTYH, NBN, NF1, NTHL1, PALB2, PDGFRA, PMS2, POLD1, POLE, PTEN, RAD50, RAD51C, RAD51D, SDHA, SDHB, SDHC, SDHD, SMAD4, SMARCA4, STK11, TP53, TSC1, TSC2, and VHL.  A variant of uncertain significance (VUS) called ATM [id number] (p.[id number]) was also noted. At this time, it is unknown if this variant is associated with increased cancer risk or if this is a normal finding, but most variants such as this get reclassified to being inconsequential. It should not be used to make medical management decisions. With time, we suspect the lab will determine the significance of this variant, if any. If we do learn more about it, we will try to contact Ms. Colquitt to discuss it further. However, it is important to stay in touch with [place] periodically and keep the address and phone number up to date.  The test report will be scanned into EPIC and will be located under the Molecular Pathology section of the Results Review tab.A portion of the result report is included below for reference.  We discussed with Ms. Mowbray that since the current genetic testing is not perfect, it is possible there may be a gene mutation in one of these genes that current testing cannot detect, but that chance is small. We also discussed, that it is possible that another gene that has not yet been discovered, or that we have not yet tested, is responsible for the cancer diagnoses in the family. Therefore, important to remain in touch with cancer genetics in the future so that we can continue to offer Ms. Gillooly the most up to date genetic testing.   CANCER SCREENING RECOMMENDATIONS: Given Ms. Hainsworth's family history of cancers and reported positive results in her mother (but no documentation), we  must interpret these negative results with some caution.  Families with features suggestive of hereditary risk for cancer tend to have multiple family members with cancer, diagnoses in multiple generations and diagnoses before the age of 25. Ms. Duell family exhibits some of these features. Thus this result may simply reflect our current inability to detect all mutations within these genes or there may be a different gene that has not yet been discovered or tested.   Though there appears to be a familial predisposition to cancers and tumors in Ms. Spurgeon's maternal family history, no causative or actionable mutations were identified by her testing. Therefore, she was advised to follow the cancer screening recommendations of her referring and primary care physicians. Our risk assessment remains incomplete until we have the context of her mother's genetic testing results. Until then, cancer risk estimates and surveillance should be based on the observed family history. Ms. Hinchcliff should consult her ordering physician regarding whether high-risk breast screening through alternating mammograms and breast MRIs every 6 months is recommended.  RECOMMENDATIONS FOR FAMILY MEMBERS: Women in this family might be at some increased risk of developing cancer, over the general population risk, simply due to the family history of cancer. We recommended women in this family have a yearly mammogram beginning at age 72, or 72 years younger than the earliest onset of cancer, an annual clinical breast exam, and perform monthly breast self-exams. Women in this family should also have a gynecological exam as recommended by their primary provider. All family members should have a colonoscopy by age 36.  It is recommended that Ms. Davidian obtain a copy of her mother's genetic testing results as they would further inform genetic testing recommendations for other family members and risk assessment. It appears that Ms.  Litaker's sister is a candidate for genetic testing in the absence of knowledge of her mother's results. Other family members, such as maternal aunt, uncle, and cousins are also candidates for testing. If Ms. Perrow's maternal relatives would like to learn more about hereditary cancer risks and genetic testing options, they are advised to consult a genetic counselor or their health care providers.   FOLLOW-UP: Lastly, we discussed with Ms. Prioleau that cancer genetics is a rapidly advancing field and it is possible that new genetic tests will be appropriate for her and/or her family members in the future. We encouraged her to remain in contact with cancer genetics on an annual basis so we can update her personal and family histories and let her know of advances in cancer genetics that may benefit this family.   Our contact number was provided. Ms. Nulty questions were answered to her satisfaction, and she knows she is welcome to call us at anytime with additional questions or concerns.   Mal Misty, MS, [facility]  Certified Naval [email or web address]

## 2016-09-14 ENCOUNTER — Ambulatory Visit: Payer: Self-pay | Admitting: Family Medicine

## 2016-09-20 MED FILL — LEVOTHYROXINE 75 MCG TABLET: 75 | 30 days supply | Qty: 30 | Fill #1 | Status: TO

## 2016-09-20 MED FILL — PRAMIPEXOLE 1 MG TABLET: 1 | 30 days supply | Qty: 30 | Fill #1

## 2016-09-21 DIAGNOSIS — F3175 Bipolar disorder, in partial remission, most recent episode depressed: Secondary | ICD-10-CM | POA: Diagnosis not present

## 2016-09-22 ENCOUNTER — Ambulatory Visit: Payer: 59 | Admitting: Internal Medicine

## 2016-09-22 MED FILL — SERTRALINE HCL 100 MG TAB: 100 | 30 days supply | Qty: 45 | Fill #0

## 2016-09-22 MED FILL — QUETIAPINE FUMARATE 200 MG: 200 | 30 days supply | Qty: 30 | Fill #0

## 2016-10-11 ENCOUNTER — Ambulatory Visit (INDEPENDENT_AMBULATORY_CARE_PROVIDER_SITE_OTHER): Payer: 59 | Admitting: Family

## 2016-10-11 ENCOUNTER — Encounter: Payer: Self-pay | Admitting: Family

## 2016-10-11 VITALS — BP 103/56 | HR 64 | Temp 99.2°F | Resp 16 | Ht <= 58 in | Wt 139.2 lb

## 2016-10-11 DIAGNOSIS — R21 Rash and other nonspecific skin eruption: Secondary | ICD-10-CM

## 2016-10-11 MED ORDER — PREDNISONE 10 MG PO TABS: ORAL_TABLET | ORAL | 0 refills | Status: DC

## 2016-10-11 NOTE — Patient Instructions (Addendum)
Please begin prednisone. You may take benadryl as needed. Call if symptoms worsen or if symptoms fail to improve.

## 2016-10-11 NOTE — Progress Notes (Signed)
Subjective:    Patient ID: Janet Turner, female    DOB: [birth year], 51 y.o.   MRN: [id number]  HPI  Janet Turner is a 51 yr old female who presents today with chief complaint of rash. Rash has been present x 2 weeks and is located on her arms and legs.  Has been taking aveeno baths. This seems to be helping. Has tried benadryl with some improvement. Notes that she bought a used couch 2 weeks ago. Rash seemed to start at the same time. Notes that husband does not have rash. Denies exposure to poison ivy.   Review of Systems Past Medical History:  Diagnosis Date  . Abnormal Pap smear of cervix 2013, 2015   positive HR HPV  . Allergy   . Amenorrhea   . Anxiety   . Chicken pox   . Complication of anesthesia    oxygen level  and blood pressure dropped wtih anterior cervical fusion surgery   . Depression   . Family history of adverse reaction to anesthesia    mother- nausea and vomiting   . Gastritis    chronic, inactive  . Genetic testing 09/06/2016   Ms. Linney underwent genetic counseling on 08/05/2016 and genetic testing for hereditary cancer syndromes on 08/23/2016. Her results were negative for mutations in all 46 genes analyzed by [facility]'s 46-gene Common Hereditary Cancers Panel. Genes analyzed include: APC, ATM, AXIN2, BARD1, BMPR1A, BRCA1, BRCA2, BRIP1, CDH1, CDKN2A, CHEK2, CTNNA1, DICER1, EPCAM, GREM1, HOXB13, KIT, MEN1, MLH1, MSH2  . GERD (gastroesophageal reflux disease)   . Heart murmur   . Hyperlipemia   . Hypertension   . Hypoglycemia   . Hypopituitarism ([place])   . Hypothyroidism   . Infertility, female   . Leg pain   . PTSD (post-traumatic stress disorder)   . Sleep apnea    no cpap      Social History   Social History  . Marital status: Married    Spouse name: N/A  . Number of children: N/A  . Years of education: N/A   Occupational History  . Not on file.   Social History Main Topics  . Smoking status: Never Smoker  . Smokeless tobacco:  Never Used  . Alcohol use No  . Drug use: No  . Sexual activity: Yes    Partners: Male    Birth control/ protection: Post-menopausal   Other Topics Concern  . Not on file   Social History Narrative  . No narrative on file    Past Surgical History:  Procedure Laterality Date  . ANTERIOR CERVICAL DISCECTOMY  2013   Ant Cervical Fusion C4-5 with diskectomy  . BREAST REDUCTION SURGERY  96  . CESAREAN SECTION  91  . CHOLECYSTECTOMY  97  . EYE SURGERY  [phone]   x's 3   . ganglion cyst removed    . LAPAROSCOPIC GASTRIC SLEEVE RESECTION N/A 05/28/2014   Procedure: LAPAROSCOPIC GASTRIC SLEEVE RESECTION;  Surgeon: Pedro Earls, MD;  Location: [facility];  Service: General;  Laterality: N/A;  . UPPER GASTROINTESTINAL ENDOSCOPY    . UPPER GI ENDOSCOPY N/A 05/28/2014   Procedure: UPPER GI ENDOSCOPY;  Surgeon: Pedro Earls, MD;  Location: [facility];  Service: General;  Laterality: N/A;    Family History  Problem Relation Age of Onset  . Osteoarthritis Mother   . Breast cancer Mother 45       treated with bilateral mastectomies, chemo, radiation  . Other Mother 68  reported to have positive genetic testing  . Factor V Leiden deficiency Mother   . Hypertension Father   . Melanoma Father   . Depression Father        PTSD  . Cancer Father 21       melanoma; recurred and metastasized at 8  . Alcoholism Unknown   . Stroke Unknown        Paternal Family--9/12 children after age 26  . Anuerysm Unknown        Paternal family  . Hypertension Unknown        Paternal family  . Ovarian cancer Maternal Grandmother 61       treated with surgery and chemo  . Lung cancer Maternal Grandmother 73       h/o smoking  . Breast cancer Maternal Grandmother 50       mastectomy.recurence age 67 on the other, mastectomy.treated with chemo and RX  . Colon polyps Maternal Grandmother        polyposis since middle-age  . Other Maternal Grandmother 50       abdominal desmoid tumor-no  surgery due to blood vessel involvement  . Prostate cancer Paternal Grandfather 86  . Factor V Leiden deficiency Sister   . Aneurysm Paternal Aunt 80       brain  . Aneurysm Maternal Grandfather 69       abdominal and cause of death  . Aneurysm Paternal Grandmother 77       brain  . Colon cancer Neg Hx   . Esophageal cancer Neg Hx   . Rectal cancer Neg Hx   . Stomach cancer Neg Hx     Allergies  Allergen Reactions  . Abilify [Aripiprazole] Other (See Comments)    Caused dyskinesia (patient reported)  . Penicillins Hives and Itching    Current Outpatient Prescriptions on File Prior to Visit  Medication Sig Dispense Refill  . atorvastatin (LIPITOR) 20 MG tablet Take 1 tablet (20 mg total) by mouth daily. 90 tablet 1  . cyanocobalamin (,VITAMIN B-12,) 1000 MCG/ML injection Inject 1 mL (1,000 mcg total) into the muscle every 30 (thirty) days. 1 mL 0  . lamoTRIgine (LAMICTAL) 150 MG tablet Take 300 mg by mouth every morning.     Marland Kitchen levothyroxine (SYNTHROID, LEVOTHROID) 75 MCG tablet TAKE 1 TABLET BY MOUTH ONCE DAILY BEFORE BREAKFAST 30 tablet 2  . lisinopril (PRINIVIL,ZESTRIL) 10 MG tablet Take 1 tablet (10 mg total) by mouth daily. 30 tablet 3  . pantoprazole (PROTONIX) 40 MG tablet TAKE 1 TABLET (40 MG TOTAL) BY MOUTH DAILY. 30 tablet 2  . pramipexole (MIRAPEX) 1 MG tablet Take 1 tablet (1 mg total) by mouth daily. 90 tablet 1  . sertraline (ZOLOFT) 100 MG tablet Take 150 mg by mouth every morning.      No current facility-administered medications on file prior to visit.     BP (!) 103/56 (BP Location: Left Arm, Cuff Size: Large)   Pulse 64   Temp 99.2 F (37.3 C) (Oral)   Resp 16   Ht _0  (1.448 m)   Wt 139 lb 3.2 oz (63.1 kg)   LMP 01/15/2000   SpO2 97%   BMI 30.12 kg/m       Objective:   Physical Exam  Constitutional: She is oriented to person, place, and time. She appears well-developed and well-nourished. No distress.  Musculoskeletal: She exhibits no edema.    Neurological: She is alert and oriented to person, place, and time.  Skin: Skin  is warm and dry.  Raised erythematous rash noted on bilateral UE/LE  Psychiatric: She has a normal mood and affect. Her behavior is normal. Judgment and thought content normal.           Assessment & Plan:  Rash- differential includes poison ivy, eczema, allergic reaction. Advised pt to start prednisone taper.  Apply cover to sofa. If no improvement in her symptoms may need to consider treatment for scabies and removal of sofa from the home. She is advised to let me know if symptoms fail to improve.

## 2016-10-12 MED FILL — predniSONE 10 MG TABS: 10 | 8 days supply | Qty: 20 | Fill #0

## 2016-10-13 DIAGNOSIS — F3175 Bipolar disorder, in partial remission, most recent episode depressed: Secondary | ICD-10-CM | POA: Diagnosis not present

## 2016-10-18 MED FILL — QUETIAPINE FUMARATE 400 MG: 400 | 30 days supply | Qty: 30 | Fill #0

## 2016-10-26 ENCOUNTER — Other Ambulatory Visit: Payer: Self-pay | Admitting: Family Medicine

## 2016-10-26 MED ORDER — LISINOPRIL 10 MG PO TABS: 10.0000 mg | ORAL_TABLET | Freq: Every day | ORAL | 3 refills | Status: DC

## 2016-11-30 DIAGNOSIS — H5021 Vertical strabismus, right eye: Secondary | ICD-10-CM | POA: Diagnosis not present

## 2016-11-30 DIAGNOSIS — H5005 Alternating esotropia: Secondary | ICD-10-CM | POA: Diagnosis not present

## 2016-12-16 ENCOUNTER — Encounter (HOSPITAL_BASED_OUTPATIENT_CLINIC_OR_DEPARTMENT_OTHER): Payer: Self-pay | Admitting: *Deleted

## 2016-12-17 ENCOUNTER — Telehealth: Payer: Self-pay | Admitting: Neurology

## 2016-12-17 NOTE — Telephone Encounter (Signed)
Pt has not been using CPAP machine for the past 2-3 mths, kept taking it off in her sleep, she was not serious about using it. Her husband told her when she is sleeping on her back she "gurgles" which has scared her.  (She is having corrective eye surgery in a week.) She is wanting to get another CPAP but does not want to use [facility].  An appt has been scheduled with Eber Jonesarolyn 9/27. Pt is wanting to get the CPAP prior to appt, asap if possible

## 2016-12-17 NOTE — Telephone Encounter (Signed)
Pt will need to be seen in the office before another cpap can be ordered. Pt has not been seen in over one year. I will send to Baird Lyonsasey, RN for review. The appt with Eber Jonesarolyn, NP on 02/03/17 may be the soonest available appt at this time.

## 2016-12-21 ENCOUNTER — Encounter (HOSPITAL_BASED_OUTPATIENT_CLINIC_OR_DEPARTMENT_OTHER)
Admission: RE | Admit: 2016-12-21 | Discharge: 2016-12-21 | Disposition: A | Discharge status: Home / Self Care | Payer: 59 | Source: Ambulatory Visit | Attending: Ophthalmology | Admitting: Ophthalmology

## 2016-12-21 ENCOUNTER — Ambulatory Visit: Payer: Self-pay | Admitting: Ophthalmology

## 2016-12-21 DIAGNOSIS — H5000 Unspecified esotropia: Secondary | ICD-10-CM | POA: Diagnosis not present

## 2016-12-21 DIAGNOSIS — E785 Hyperlipidemia, unspecified: Secondary | ICD-10-CM | POA: Diagnosis not present

## 2016-12-21 DIAGNOSIS — Z01810 Encounter for preprocedural cardiovascular examination: Secondary | ICD-10-CM | POA: Insufficient documentation

## 2016-12-21 DIAGNOSIS — F4310 Post-traumatic stress disorder, unspecified: Secondary | ICD-10-CM | POA: Diagnosis not present

## 2016-12-21 DIAGNOSIS — I10 Essential (primary) hypertension: Secondary | ICD-10-CM

## 2016-12-21 DIAGNOSIS — G4730 Sleep apnea, unspecified: Secondary | ICD-10-CM | POA: Diagnosis not present

## 2016-12-21 DIAGNOSIS — H5022 Vertical strabismus, left eye: Secondary | ICD-10-CM | POA: Diagnosis not present

## 2016-12-21 DIAGNOSIS — K219 Gastro-esophageal reflux disease without esophagitis: Secondary | ICD-10-CM | POA: Diagnosis not present

## 2016-12-21 DIAGNOSIS — Z79899 Other long term (current) drug therapy: Secondary | ICD-10-CM | POA: Diagnosis not present

## 2016-12-21 DIAGNOSIS — F329 Major depressive disorder, single episode, unspecified: Secondary | ICD-10-CM | POA: Diagnosis not present

## 2016-12-21 DIAGNOSIS — E039 Hypothyroidism, unspecified: Secondary | ICD-10-CM | POA: Diagnosis not present

## 2016-12-21 DIAGNOSIS — E230 Hypopituitarism: Secondary | ICD-10-CM | POA: Diagnosis not present

## 2016-12-21 NOTE — Telephone Encounter (Signed)
Was able to offer pt a sooner apt. Sept 6th at 8:30 am. Pt verbalized understanding of arriving earlier

## 2016-12-23 ENCOUNTER — Encounter (HOSPITAL_BASED_OUTPATIENT_CLINIC_OR_DEPARTMENT_OTHER): Payer: Self-pay | Admitting: *Deleted

## 2016-12-23 ENCOUNTER — Ambulatory Visit: Payer: Self-pay | Admitting: Ophthalmology

## 2016-12-23 NOTE — H&P (Signed)
Date of examination:  11-30-16  Indication for surgery: to straighten the eyes and allow some binocularity  Pertinent past medical history:  Past Medical History:  Diagnosis Date  . Abnormal Pap smear of cervix 2013, 2015   positive HR HPV  . Allergy   . Amenorrhea   . Anxiety   . Chicken pox   . Complication of anesthesia    oxygen level  and blood pressure dropped wtih anterior cervical fusion surgery   . Depression   . Family history of adverse reaction to anesthesia    mother- nausea and vomiting   . Gastritis    chronic, inactive  . Genetic testing 09/06/2016   Ms. Tess underwent genetic counseling on 08/05/2016 and genetic testing for hereditary cancer syndromes on 08/23/2016. Her results were negative for mutations in all 46 genes analyzed by [facility]'s 46-gene Common Hereditary Cancers Panel. Genes analyzed include: APC, ATM, AXIN2, BARD1, BMPR1A, BRCA1, BRCA2, BRIP1, CDH1, CDKN2A, CHEK2, CTNNA1, DICER1, EPCAM, GREM1, HOXB13, KIT, MEN1, MLH1, MSH2  . GERD (gastroesophageal reflux disease)   . Heart murmur   . Hyperlipemia   . Hypertension   . Hypoglycemia   . Hypopituitarism ([place])   . Hypothyroidism   . Infertility, female   . Leg pain   . PTSD (post-traumatic stress disorder)   . Sleep apnea    no cpap     Pertinent ocular history:  Strabismus surgery x 3, details unavailable but patient believes all were for esotropia  Pertinent family history:  Family History  Problem Relation Age of Onset  . Osteoarthritis Mother   . Breast cancer Mother 62       treated with bilateral mastectomies, chemo, radiation  . Other Mother 53       reported to have positive genetic testing  . Factor V Leiden deficiency Mother   . Hypertension Father   . Melanoma Father   . Depression Father        PTSD  . Cancer Father 46       melanoma; recurred and metastasized at 110  . Alcoholism Unknown   . Stroke Unknown        Paternal Family--9/12 children after age 33  . Anuerysm  Unknown        Paternal family  . Hypertension Unknown        Paternal family  . Ovarian cancer Maternal Grandmother 74       treated with surgery and chemo  . Lung cancer Maternal Grandmother 73       h/o smoking  . Breast cancer Maternal Grandmother 50       mastectomy.recurence age 59 on the other, mastectomy.treated with chemo and RX  . Colon polyps Maternal Grandmother        polyposis since middle-age  . Other Maternal Grandmother 50       abdominal desmoid tumor-no surgery due to blood vessel involvement  . Prostate cancer Paternal Grandfather 74  . Factor V Leiden deficiency Sister   . Aneurysm Paternal Aunt 38       brain  . Aneurysm Maternal Grandfather 69       abdominal and cause of death  . Aneurysm Paternal Grandmother 62       brain  . Colon cancer Neg Hx   . Esophageal cancer Neg Hx   . Rectal cancer Neg Hx   . Stomach cancer Neg Hx     General:  Healthy appearing patient in no distress.    Eyes:  Acuity cc OD 20/20  OS 20/30  External: Within normal limits     Anterior segment: Within normal limits  X healed conj scars N and T OU  Motility:   ET=35, increase L decrease R, small "A", LHoT 8, RSO OA, ?RIO OA also, ET'40, LHoT'8  Fundus: deferred  Refraction:  Myopia/astig OS>OD  Heart: Regular rate and rhythm without murmur     Lungs: Clear to auscultation     Impression:Esotropia, recurrent vs. Residual, s/p strabismus surgery x 3 "all for ET" but details unknown.  Note horizontal incomitance, and LHoT  Plan: Explore medial rectus and lateral rectus muscles both eyes.  Recess or re-recess medials, resect or advance laterals as needed, likely one adjustable, ? Right inferior oblique recession also.  Derry Skill

## 2016-12-24 ENCOUNTER — Encounter (HOSPITAL_BASED_OUTPATIENT_CLINIC_OR_DEPARTMENT_OTHER)
Admission: RE | Disposition: A | Discharge status: Home / Self Care | Payer: Self-pay | Source: Ambulatory Visit | Attending: Ophthalmology

## 2016-12-24 ENCOUNTER — Ambulatory Visit (HOSPITAL_BASED_OUTPATIENT_CLINIC_OR_DEPARTMENT_OTHER)
Admission: RE | Admit: 2016-12-24 | Discharge: 2016-12-24 | Disposition: A | Discharge status: Home / Self Care | Payer: 59 | Source: Ambulatory Visit | Attending: Ophthalmology | Admitting: Ophthalmology

## 2016-12-24 ENCOUNTER — Encounter (HOSPITAL_BASED_OUTPATIENT_CLINIC_OR_DEPARTMENT_OTHER): Payer: Self-pay | Admitting: Anesthesiology

## 2016-12-24 ENCOUNTER — Ambulatory Visit (HOSPITAL_BASED_OUTPATIENT_CLINIC_OR_DEPARTMENT_OTHER): Payer: 59 | Admitting: Anesthesiology

## 2016-12-24 DIAGNOSIS — H5005 Alternating esotropia: Secondary | ICD-10-CM | POA: Diagnosis not present

## 2016-12-24 DIAGNOSIS — E230 Hypopituitarism: Secondary | ICD-10-CM | POA: Diagnosis not present

## 2016-12-24 DIAGNOSIS — F329 Major depressive disorder, single episode, unspecified: Secondary | ICD-10-CM | POA: Insufficient documentation

## 2016-12-24 DIAGNOSIS — K219 Gastro-esophageal reflux disease without esophagitis: Secondary | ICD-10-CM | POA: Diagnosis not present

## 2016-12-24 DIAGNOSIS — I10 Essential (primary) hypertension: Secondary | ICD-10-CM | POA: Insufficient documentation

## 2016-12-24 DIAGNOSIS — E785 Hyperlipidemia, unspecified: Secondary | ICD-10-CM | POA: Diagnosis not present

## 2016-12-24 DIAGNOSIS — H5022 Vertical strabismus, left eye: Secondary | ICD-10-CM | POA: Insufficient documentation

## 2016-12-24 DIAGNOSIS — F4310 Post-traumatic stress disorder, unspecified: Secondary | ICD-10-CM | POA: Insufficient documentation

## 2016-12-24 DIAGNOSIS — G4730 Sleep apnea, unspecified: Secondary | ICD-10-CM | POA: Diagnosis not present

## 2016-12-24 DIAGNOSIS — E039 Hypothyroidism, unspecified: Secondary | ICD-10-CM | POA: Insufficient documentation

## 2016-12-24 DIAGNOSIS — Z79899 Other long term (current) drug therapy: Secondary | ICD-10-CM | POA: Insufficient documentation

## 2016-12-24 DIAGNOSIS — H5021 Vertical strabismus, right eye: Secondary | ICD-10-CM | POA: Diagnosis not present

## 2016-12-24 DIAGNOSIS — H5000 Unspecified esotropia: Secondary | ICD-10-CM | POA: Diagnosis not present

## 2016-12-24 HISTORY — PX: STRABISMUS SURGERY: SHX218

## 2016-12-24 SURGERY — STRABISMUS SURGERY, BILATERAL
Anesthesia: General | Site: Eye | Laterality: Bilateral

## 2016-12-24 MED ORDER — MIDAZOLAM HCL 2 MG/2ML IJ SOLN
1.0000 mg | INTRAMUSCULAR | Status: DC | PRN
  Administered 2016-12-24: 2 mg via INTRAVENOUS

## 2016-12-24 MED ORDER — LIDOCAINE HCL (CARDIAC) 20 MG/ML IV SOLN
INTRAVENOUS | Status: DC | PRN
  Administered 2016-12-24: 50 mg via INTRAVENOUS

## 2016-12-24 MED ORDER — PROMETHAZINE HCL 25 MG/ML IJ SOLN: 6.2500 mg | INTRAMUSCULAR | Status: DC | PRN

## 2016-12-24 MED ORDER — OXYCODONE HCL 5 MG PO TABS: 5.0000 mg | ORAL_TABLET | Freq: Once | ORAL | Status: DC | PRN

## 2016-12-24 MED ORDER — EPHEDRINE SULFATE 50 MG/ML IJ SOLN
INTRAMUSCULAR | Status: DC | PRN
  Administered 2016-12-24: 5 mg via INTRAVENOUS
  Administered 2016-12-24 (×2): 10 mg via INTRAVENOUS
  Administered 2016-12-24: 5 mg via INTRAVENOUS

## 2016-12-24 MED ORDER — FENTANYL CITRATE (PF) 100 MCG/2ML IJ SOLN
50.0000 ug | INTRAMUSCULAR | Status: DC | PRN
  Administered 2016-12-24 (×2): 50 ug via INTRAVENOUS

## 2016-12-24 MED ORDER — DEXAMETHASONE SODIUM PHOSPHATE 10 MG/ML IJ SOLN
INTRAMUSCULAR | Status: AC
  Filled 2016-12-24: qty 1

## 2016-12-24 MED ORDER — GLYCOPYRROLATE 0.2 MG/ML IJ SOLN
INTRAMUSCULAR | Status: DC | PRN
  Administered 2016-12-24: 0.3 mg via INTRAVENOUS

## 2016-12-24 MED ORDER — FENTANYL CITRATE (PF) 100 MCG/2ML IJ SOLN
INTRAMUSCULAR | Status: AC
  Filled 2016-12-24: qty 2

## 2016-12-24 MED ORDER — DEXAMETHASONE SODIUM PHOSPHATE 4 MG/ML IJ SOLN
INTRAMUSCULAR | Status: DC | PRN
  Administered 2016-12-24: 10 mg via INTRAVENOUS

## 2016-12-24 MED ORDER — PHENYLEPHRINE HCL 10 MG/ML IJ SOLN
INTRAMUSCULAR | Status: DC | PRN
  Administered 2016-12-24: 80 ug via INTRAVENOUS

## 2016-12-24 MED ORDER — PROPOFOL 10 MG/ML IV BOLUS
INTRAVENOUS | Status: DC | PRN
  Administered 2016-12-24: 200 mg via INTRAVENOUS

## 2016-12-24 MED ORDER — MIDAZOLAM HCL 2 MG/2ML IJ SOLN
INTRAMUSCULAR | Status: AC
  Filled 2016-12-24: qty 2

## 2016-12-24 MED ORDER — FENTANYL CITRATE (PF) 100 MCG/2ML IJ SOLN
25.0000 ug | INTRAMUSCULAR | Status: DC | PRN
  Administered 2016-12-24 (×2): 50 ug via INTRAVENOUS

## 2016-12-24 MED ORDER — OXYCODONE HCL 5 MG/5ML PO SOLN: 5.0000 mg | Freq: Once | ORAL | Status: DC | PRN

## 2016-12-24 MED ORDER — ONDANSETRON HCL 4 MG/2ML IJ SOLN
INTRAMUSCULAR | Status: AC
  Filled 2016-12-24: qty 2

## 2016-12-24 MED ORDER — PHENYLEPHRINE 40 MCG/ML (10ML) SYRINGE FOR IV PUSH (FOR BLOOD PRESSURE SUPPORT)
PREFILLED_SYRINGE | INTRAVENOUS | Status: AC
  Filled 2016-12-24: qty 10

## 2016-12-24 MED ORDER — BSS IO SOLN
INTRAOCULAR | Status: DC | PRN
  Administered 2016-12-24: 15 mL via INTRAOCULAR

## 2016-12-24 MED ORDER — KETOROLAC TROMETHAMINE 30 MG/ML IJ SOLN
INTRAMUSCULAR | Status: AC
  Filled 2016-12-24: qty 1

## 2016-12-24 MED ORDER — ONDANSETRON HCL 4 MG/2ML IJ SOLN
INTRAMUSCULAR | Status: DC | PRN
  Administered 2016-12-24: 4 mg via INTRAVENOUS

## 2016-12-24 MED ORDER — TOBRAMYCIN-DEXAMETHASONE 0.3-0.1 % OP OINT
TOPICAL_OINTMENT | OPHTHALMIC | Status: DC | PRN
  Administered 2016-12-24: 1 via OPHTHALMIC

## 2016-12-24 MED ORDER — LIDOCAINE 2% (20 MG/ML) 5 ML SYRINGE
INTRAMUSCULAR | Status: AC
  Filled 2016-12-24: qty 5

## 2016-12-24 MED ORDER — PROPOFOL 10 MG/ML IV BOLUS
INTRAVENOUS | Status: AC
  Filled 2016-12-24: qty 20

## 2016-12-24 MED ORDER — MEPERIDINE HCL 25 MG/ML IJ SOLN: 6.2500 mg | INTRAMUSCULAR | Status: DC | PRN

## 2016-12-24 MED ORDER — SUCCINYLCHOLINE CHLORIDE 200 MG/10ML IV SOSY
PREFILLED_SYRINGE | INTRAVENOUS | Status: AC
Start: 2016-12-24 — End: 2016-12-24
  Filled 2016-12-24: qty 10

## 2016-12-24 MED ORDER — SCOPOLAMINE 1 MG/3DAYS TD PT72: 1.0000 | MEDICATED_PATCH | Freq: Once | TRANSDERMAL | Status: DC | PRN

## 2016-12-24 MED ORDER — KETOROLAC TROMETHAMINE 30 MG/ML IJ SOLN
INTRAMUSCULAR | Status: DC | PRN
  Administered 2016-12-24: 30 mg via INTRAVENOUS

## 2016-12-24 MED ORDER — LACTATED RINGERS IV SOLN: INTRAVENOUS | Status: DC

## 2016-12-24 MED ORDER — EPHEDRINE 5 MG/ML INJ
INTRAVENOUS | Status: AC
  Filled 2016-12-24: qty 10

## 2016-12-24 MED ORDER — TOBRAMYCIN-DEXAMETHASONE 0.3-0.1 % OP OINT: 1.0000 "application " | TOPICAL_OINTMENT | Freq: Two times a day (BID) | OPHTHALMIC | 0 refills | Status: DC

## 2016-12-24 MED ORDER — LACTATED RINGERS IV SOLN
INTRAVENOUS | Status: DC
  Administered 2016-12-24 (×2): via INTRAVENOUS

## 2016-12-24 SURGICAL SUPPLY — 29 items
APPLICATOR COTTON TIP 6IN STRL (MISCELLANEOUS) ×8 IMPLANT
APPLICATOR DR MATTHEWS STRL (MISCELLANEOUS) ×4 IMPLANT
BANDAGE EYE OVAL (MISCELLANEOUS) IMPLANT
CAUTERY EYE LOW TEMP 1300F FIN (OPHTHALMIC RELATED) IMPLANT
COVER BACK TABLE 60X90IN (DRAPES) ×2 IMPLANT
COVER MAYO STAND STRL (DRAPES) ×2 IMPLANT
DRAPE SURG 17X23 STRL (DRAPES) ×4 IMPLANT
DRAPE U-SHAPE 76X120 STRL (DRAPES) ×2 IMPLANT
GLOVE BIO SURGEON STRL SZ 6.5 (GLOVE) ×4 IMPLANT
GLOVE BIOGEL M STRL SZ7.5 (GLOVE) ×4 IMPLANT
GOWN STRL REUS W/ TWL LRG LVL3 (GOWN DISPOSABLE) ×1 IMPLANT
GOWN STRL REUS W/TWL LRG LVL3 (GOWN DISPOSABLE) ×1
GOWN STRL REUS W/TWL XL LVL3 (GOWN DISPOSABLE) ×4 IMPLANT
NS IRRIG 1000ML POUR BTL (IV SOLUTION) ×2 IMPLANT
PACK BASIN DAY SURGERY FS (CUSTOM PROCEDURE TRAY) ×2 IMPLANT
SHEET MEDIUM DRAPE 40X70 STRL (DRAPES) IMPLANT
SPEAR EYE SURG WECK-CEL (MISCELLANEOUS) ×6 IMPLANT
STRIP CLOSURE SKIN 1/4X4 (GAUZE/BANDAGES/DRESSINGS) ×2 IMPLANT
SUT 6 0 SILK T G140 8DA (SUTURE) ×2 IMPLANT
SUT MERSILENE 6-0 18IN S14 8MM (SUTURE) ×2
SUT PLAIN 6 0 TG1408 (SUTURE) ×2 IMPLANT
SUT SILK 4 0 C 3 735G (SUTURE) ×2 IMPLANT
SUT VICRYL 6 0 S 28 (SUTURE) ×2 IMPLANT
SUT VICRYL ABS 6-0 S29 18IN (SUTURE) ×4 IMPLANT
SUTURE MERSLN 6-0 18IN S14 8MM (SUTURE) ×1 IMPLANT
SYR 10ML LL (SYRINGE) ×2 IMPLANT
SYR TB 1ML LL NO SAFETY (SYRINGE) ×2 IMPLANT
TOWEL OR 17X24 6PK STRL BLUE (TOWEL DISPOSABLE) ×2 IMPLANT
TRAY DSU PREP LF (CUSTOM PROCEDURE TRAY) ×2 IMPLANT

## 2016-12-24 NOTE — Anesthesia Preprocedure Evaluation (Addendum)
Anesthesia Evaluation  Patient identified by MRN, date of birth, ID band Patient awake    Reviewed: Allergy & Precautions, NPO status , Patient's Chart, lab work & pertinent test results  Airway Mallampati: II  TM Distance: >3 FB Neck ROM: Full  Mouth opening: Limited Mouth Opening  Dental  (+) Teeth Intact, Dental Advisory Given   Pulmonary sleep apnea ,    breath sounds clear to auscultation       Cardiovascular hypertension, Pt. on medications negative cardio ROS   Rhythm:Regular Rate:Normal     Neuro/Psych  Headaches, PSYCHIATRIC DISORDERS Anxiety Depression Bipolar Disorder    GI/Hepatic Neg liver ROS, GERD  Medicated,  Endo/Other  Hypothyroidism   Renal/GU negative Renal ROS     Musculoskeletal negative musculoskeletal ROS (+)   Abdominal   Peds  Hematology negative hematology ROS (+)   Anesthesia Other Findings - HLD - PTSD    Reproductive/Obstetrics                            Anesthesia Physical Anesthesia Plan  ASA: III  Anesthesia Plan: General   Post-op Pain Management:    Induction: Intravenous  PONV Risk Score and Plan: 4 or greater and Ondansetron, Dexamethasone, Midazolam and Scopolamine patch - Pre-op  Airway Management Planned: LMA  Additional Equipment:   Intra-op Plan:   Post-operative Plan: Extubation in OR  Informed Consent: I have reviewed the patients History and Physical, chart, labs and discussed the procedure including the risks, benefits and alternatives for the proposed anesthesia with the patient or authorized representative who has indicated his/her understanding and acceptance.   Dental advisory given  Plan Discussed with: CRNA  Anesthesia Plan Comments:         Anesthesia Quick Evaluation

## 2016-12-24 NOTE — Interval H&P Note (Signed)
History and Physical Interval Note:  12/24/2016 10:34 AM  Janet Turner  has presented today for surgery, with the diagnosis of ESOTROPIA  The various methods of treatment have been discussed with the patient and family. After consideration of risks, benefits and other options for treatment, the patient has consented to  Procedure(s): REPAIR STRABISMUS BILATERAL (Bilateral) as a surgical intervention .  The patient's history has been reviewed, patient examined, no change in status, stable for surgery.  I have reviewed the patient's chart and labs.  Questions were answered to the patient's satisfaction.     Shara Blazing

## 2016-12-24 NOTE — Anesthesia Postprocedure Evaluation (Signed)
Anesthesia Post Note  Patient: Issa Digeronimo Kyser  Procedure(s) Performed: Procedure(s) (LRB): BILATERAL STRABISMUS REPAIR (Bilateral)     Patient location during evaluation: PACU Anesthesia Type: General Level of consciousness: awake and alert Pain management: pain level controlled Vital Signs Assessment: post-procedure vital signs reviewed and stable Respiratory status: spontaneous breathing, nonlabored ventilation, respiratory function stable and patient connected to nasal cannula oxygen Cardiovascular status: blood pressure returned to baseline and stable Postop Assessment: no signs of nausea or vomiting Anesthetic complications: no    Last Vitals:  Vitals:   12/24/16 1400 12/24/16 1415  BP: (!) 86/51 (!) 95/52  Pulse: 63 74  Resp: 10 12  Temp:    SpO2: 98% 99%    Last Pain:  Vitals:   12/24/16 1415  TempSrc:   PainSc: 3                  Shelton Silvas

## 2016-12-24 NOTE — Transfer of Care (Signed)
Immediate Anesthesia Transfer of Care Note  Patient: Janet Turner  Procedure(s) Performed: Procedure(s): BILATERAL STRABISMUS REPAIR (Bilateral)  Patient Location: PACU  Anesthesia Type:General  Level of Consciousness: awake, sedated and patient cooperative  Airway & Oxygen Therapy: Patient Spontanous Breathing and Patient connected to face mask oxygen  Post-op Assessment: Report given to RN and Post -op Vital signs reviewed and stable  Post vital signs: Reviewed and stable  Last Vitals:  Vitals:   12/24/16 1003  BP: 123/61  Pulse: (!) 56  Resp: 16  Temp: 36.7 C  SpO2: 100%    Last Pain:  Vitals:   12/24/16 1003  TempSrc: Oral      Patients Stated Pain Goal: 3 (12/24/16 1003)  Complications: No apparent anesthesia complications

## 2016-12-24 NOTE — Discharge Instructions (Signed)
°  Post Anesthesia Home Care Instructions  Activity: Get plenty of rest for the remainder of the day. A responsible individual must stay with you for 24 hours following the procedure.  For the next 24 hours, DO NOT: -Drive a car -Advertising copywriter -Drink alcoholic beverages -Take any medication unless instructed by your physician -Make any legal decisions or sign important papers.  Meals: Start with liquid foods such as gelatin or soup. Progress to regular foods as tolerated. Avoid greasy, spicy, heavy foods. If nausea and/or vomiting occur, drink only clear liquids until the nausea and/or vomiting subsides. Call your physician if vomiting continues.  Special Instructions/Symptoms: Your throat may feel dry or sore from the anesthesia or the breathing tube placed in your throat during surgery. If this causes discomfort, gargle with warm salt water. The discomfort should disappear within 24 hours.  If you had a scopolamine patch placed behind your ear for the management of post- operative nausea and/or vomiting:  1. The medication in the patch is effective for 72 hours, after which it should be removed.  Wrap patch in a tissue and discard in the trash. Wash hands thoroughly with soap and water. 2. You may remove the patch earlier than 72 hours if you experience unpleasant side effects which may include dry mouth, dizziness or visual disturbances. 3. Avoid touching the patch. Wash your hands with soap and water after contact with the patch.      Diet: Clear liquids, advance to soft foods then regular diet as tolerated by this evening.  Pain control:   1)  Ibuprofen 600 mg by mouth every 6-8 hours as needed for pain  2)  Ice pack/cold compress to operated eye(s) as desired  Eye medications:  Tobradex or Zylet eye ointment 1/2 inch in operated eye(s) twice a day   Activity: No swimming for 1 week.  It is OK to let water run over the face and eyes while showering or taking a bath, even  during the first week.  No other restriction on exercise or activity.  Leave right eye patch in place until seen in Dr. Roxy Cedar office this afternoon for suture adjustment.    Call Dr. Roxy Cedar office [phone] with any problems or concerns.

## 2016-12-24 NOTE — Op Note (Signed)
12/24/2016  1:03 PM  PATIENT:  Janet Turner    PRE-OPERATIVE DIAGNOSIS:  1. ESOTROPIA, residual vs. Recurrent vs. Consecutive  2. Left hypotropia  POST-OPERATIVE DIAGNOSIS: 1. Esotropia, recurrent (consecutive)     2. Left hypotropia  PROCEDURE:  1) exploration of right lateral rectus muscle, right inferior oblique muscle, right medial rectus muscle, and left medial rectus muscle. 2) right inferior oblique muscle recession 3) left medial rectus muscle re-recession, 2.0 mm 4) right lateral rectus muscle advancement 7.0 mm/resection 3.0 mm, with 4.0 mm adjustable recession  SURGEON:  Shara Blazing, MD  ANESTHESIA:   General  COMPLICATIONS: none  OPERATIVE PROCEDURE: After preoperative evaluation including informed consent, the patient was taken to the operating room where she was identified by me. General anesthesia was induced without difficulty after placement of appropriate monitors. The patient was prepped and draped in standard sterile fashion. Lid speculum was placed in the right eye.  Through an inferotemporal fornix incision through conjunctiva and Tenon's fascia, the right lateral rectus muscle was engaged on a series of muscle hooks. It was found in a recessed position, so the muscle was then hooked from the superior side so as to avoid engaging the inferior oblique muscle. The lateral rectus muscle was isolated with a 4-0 silk traction suture using 2 muscle hooks through the conjunctival incision for exposure, the right inferior oblique muscle was engaged on a series of muscle hooks and carefully cleared of its fascial attachments all which to its insertion, which was secured with a fine curved hemostat. The muscle was disinserted. Its cut end was secured with a double-armed 6-0 Vicryl suture, with a locking bite at each border of the muscle. The right inferior rectus muscle was engaged on a series of muscle hooks. A mark was made on sclera 3 mm posterior and 3 mm  temporal to the temporal border of the inferior rectus insertion, and this was used as the exit point for the pole sutures of the inferior oblique, which were passed in crossed swords fashion and tied securely.  The lid speculum was then transferred to the left eye. Through an inferonasal fornix incision through conjunctiva and Tenon's fascia, the left medial rectus muscle was engaged on a series of muscle hooks. It was found inserted approximate 10 mm posterior to the limbus. The speculum was transferred to the right eye, where through an inferonasal fornix incision the right medial rectus muscle was explored. It was also found recessed to a position approximately 10 mm posterior to the limbus. It was elected therefore to re-recessed the left medial rectus muscle and advance/resect the right lateral rectus muscle.  The lid speculum was repositioned in the left eye. The left medial rectus muscle was engaged on a series of muscle hooks and carefully cleared of its fascial attachments and scar tissue. Its tendon was secured with a double-armed 6-0 Vicryl suture, the locking bite at each border of the muscle, 1 mm from the insertion. The muscle was disinserted, and was reattached to sclera at a measured distance of 2.0 mm posterior to the current insertion, which was found to be 10.5 mm posterior to the limbus), using direct scleral passes in crossed swords fashion. The suture ends were tied securely after the position of the muscle been checked and found be accurate. Conjunctiva was closed with 2 6-0 plain gut sutures.  The lid speculum was transferred again to the right eye. The right lateral rectus muscle was engaged on a series of muscle hooks and cleared  of its fascial attachments. A knot was tied and the muscle 3 mm posterior to the current insertion, the double-armed 6-0 Vicryl suture. The needle at each end of the suture was passed from the center of the muscle belly to the periphery, parallel to and 3 mm  posterior to the current insertion. Locking bite was placed at each border of the muscle. The muscle was secured with a resection clamp. It was disinserted. Each pole suture was passed back into the original muscle stump, 7 mm posterior to the limbus, in crossed swords fashion. The muscle was drawn up to level of the original insertion. The tube pole sutures were tied together approximately 10 cm above sclera. With the muscle still drawn up to level of the insertion, the pole sutures were then joined at a measured distance of 4.0 mm above sclera with a needle driver. A noose knot of 6-0 Vicryl was tied around the pole sutures at this location. The muscle was then allowed to hang back until the noose knot reached sclera, creating a 4 mm adjustable recession of the advanced resected lateral rectus muscle. A traction suture of 6-0 silk was placed at the temporal limbus of the right eye. Conjunctiva was at apposed loosely with a large loop of 6-0 plain gut, as well as a second interrupted 6-0 plain gut suture. The pole, noose, traction, and conjunctival sutures were taped to the cheek. After closing the inferonasal conjunctival incision with a single 6-0 plain gut suture, Tobradex ointment was placed in each eye. A sterile pad was placed over the adjustable sutures of the right eye. The patient was awakened without difficulty and taken to the recovery room in stable condition, having suffered no intraoperative or immediate postoperative complications.  Shara Blazing, MD

## 2016-12-24 NOTE — Anesthesia Procedure Notes (Addendum)
Procedure Name: LMA Insertion Date/Time: 12/24/2016 10:57 AM Performed by: Zenia Resides D Pre-anesthesia Checklist: Patient identified, Emergency Drugs available, Suction available and Patient being monitored Patient Re-evaluated:Patient Re-evaluated prior to induction Oxygen Delivery Method: Circle system utilized Preoxygenation: Pre-oxygenation with 100% oxygen Induction Type: IV induction Ventilation: Mask ventilation without difficulty LMA: LMA flexible inserted LMA Size: 4.0 Number of attempts: 1 Airway Equipment and Method: Bite block Placement Confirmation: positive ETCO2 Tube secured with: Tape Dental Injury: Teeth and Oropharynx as per pre-operative assessment

## 2016-12-24 NOTE — H&P (View-Only) (Signed)
Date of examination:  11-30-16  Indication for surgery: to straighten the eyes and allow some binocularity  Pertinent past medical history:  Past Medical History:  Diagnosis Date  . Abnormal Pap smear of cervix 2013, 2015   positive HR HPV  . Allergy   . Amenorrhea   . Anxiety   . Chicken pox   . Complication of anesthesia    oxygen level  and blood pressure dropped wtih anterior cervical fusion surgery   . Depression   . Family history of adverse reaction to anesthesia    mother- nausea and vomiting   . Gastritis    chronic, inactive  . Genetic testing 09/06/2016   Ms. Warchol underwent genetic counseling on 08/05/2016 and genetic testing for hereditary cancer syndromes on 08/23/2016. Her results were negative for mutations in all 46 genes analyzed by [facility]'s 46-gene Common Hereditary Cancers Panel. Genes analyzed include: APC, ATM, AXIN2, BARD1, BMPR1A, BRCA1, BRCA2, BRIP1, CDH1, CDKN2A, CHEK2, CTNNA1, DICER1, EPCAM, GREM1, HOXB13, KIT, MEN1, MLH1, MSH2  . GERD (gastroesophageal reflux disease)   . Heart murmur   . Hyperlipemia   . Hypertension   . Hypoglycemia   . Hypopituitarism ([place])   . Hypothyroidism   . Infertility, female   . Leg pain   . PTSD (post-traumatic stress disorder)   . Sleep apnea    no cpap     Pertinent ocular history:  Strabismus surgery x 3, details unavailable but patient believes all were for esotropia  Pertinent family history:  Family History  Problem Relation Age of Onset  . Osteoarthritis Mother   . Breast cancer Mother 62       treated with bilateral mastectomies, chemo, radiation  . Other Mother 53       reported to have positive genetic testing  . Factor V Leiden deficiency Mother   . Hypertension Father   . Melanoma Father   . Depression Father        PTSD  . Cancer Father 46       melanoma; recurred and metastasized at 110  . Alcoholism Unknown   . Stroke Unknown        Paternal Family--9/12 children after age 52  . Anuerysm  Unknown        Paternal family  . Hypertension Unknown        Paternal family  . Ovarian cancer Maternal Grandmother 74       treated with surgery and chemo  . Lung cancer Maternal Grandmother 73       h/o smoking  . Breast cancer Maternal Grandmother 50       mastectomy.recurence age 59 on the other, mastectomy.treated with chemo and RX  . Colon polyps Maternal Grandmother        polyposis since middle-age  . Other Maternal Grandmother 50       abdominal desmoid tumor-no surgery due to blood vessel involvement  . Prostate cancer Paternal Grandfather 74  . Factor V Leiden deficiency Sister   . Aneurysm Paternal Aunt 38       brain  . Aneurysm Maternal Grandfather 69       abdominal and cause of death  . Aneurysm Paternal Grandmother 62       brain  . Colon cancer Neg Hx   . Esophageal cancer Neg Hx   . Rectal cancer Neg Hx   . Stomach cancer Neg Hx     General:  Healthy appearing patient in no distress.    Eyes:  Acuity cc OD 20/20  OS 20/30  External: Within normal limits     Anterior segment: Within normal limits  X healed conj scars N and T OU  Motility:   ET=35, increase L decrease R, small "A", LHoT 8, RSO OA, ?RIO OA also, ET'40, LHoT'8  Fundus: deferred  Refraction:  Myopia/astig OS>OD  Heart: Regular rate and rhythm without murmur     Lungs: Clear to auscultation     Impression:Esotropia, recurrent vs. Residual, s/p strabismus surgery x 3 "all for ET" but details unknown.  Note horizontal incomitance, and LHoT  Plan: Explore medial rectus and lateral rectus muscles both eyes.  Recess or re-recess medials, resect or advance laterals as needed, likely one adjustable, ? Right inferior oblique recession also.  Derry Skill

## 2016-12-27 ENCOUNTER — Encounter (HOSPITAL_BASED_OUTPATIENT_CLINIC_OR_DEPARTMENT_OTHER): Payer: Self-pay | Admitting: Ophthalmology

## 2016-12-27 ENCOUNTER — Encounter (HOSPITAL_COMMUNITY): Payer: Self-pay

## 2017-01-13 ENCOUNTER — Ambulatory Visit (INDEPENDENT_AMBULATORY_CARE_PROVIDER_SITE_OTHER): Payer: 59 | Admitting: Neurology

## 2017-01-13 ENCOUNTER — Telehealth: Payer: Self-pay | Admitting: Neurology

## 2017-01-13 ENCOUNTER — Encounter: Payer: Self-pay | Admitting: Neurology

## 2017-01-13 VITALS — BP 125/74 | HR 70 | Ht <= 58 in | Wt 150.0 lb

## 2017-01-13 DIAGNOSIS — G4713 Recurrent hypersomnia: Secondary | ICD-10-CM

## 2017-01-13 NOTE — Telephone Encounter (Signed)
Per Dr. Vickey Hugerohmeier, pt needs a 6 mo f/u w/ carolyn. Pt wants to call back to schedule.

## 2017-01-13 NOTE — Patient Instructions (Signed)
Hypersomnia Hypersomnia is when you feel extremely tired during the day even though you're getting plenty of sleep at night. You may need to take naps during the day, and you may also be extremely difficult to wake up when you are sleeping. What are the causes? The cause of your hypersomnia may not be known. Hypersomnia may be caused by:  Medicines.  Sleep disorders, such as narcolepsy.  Trauma or injury to your head or nervous system.  Using drugs or alcohol.  Tumors.  Medical conditions, such as depression or hypothyroidism.  Genetics.  What are the signs or symptoms? The main symptoms of hypersomnia include:  Feeling extremely tired throughout the day.  Being very difficult to wake up.  Sleeping for longer and longer periods.  Taking naps throughout the day.  Other symptoms may include:  Feeling: ? Restless. ? Annoyed. ? Anxious. ? Low energy.  Having difficulty: ? Remembering. ? Speaking. ? Thinking.  Losing your appetite.  Experiencing hallucinations.  How is this diagnosed? Hypersomnia may be diagnosed by:  Medical history and physical exam. This will include a sleep history.  Completing sleep logs.  Tests may also be done, such as: ? Polysomnography. ? Multiple sleep latency test (MSLT).  How is this treated? There is no cure for hypersomnia, but treatment can be very effective in helping manage the condition. Treatment may include:  Lifestyle and sleeping strategies to help cope with the condition.  Stimulant medicines.  Treating any underlying causes of hypersomnia.  Follow these instructions at home:  Take medicines only as directed by your health care provider.  Schedule short naps for when you feel sleepiest during the day. Tell your employer or teachers that you have hypersomnia. You may be able to adjust your schedule to include time for naps.  Avoid drinking alcohol or caffeinated beverages.  Do not eat a heavy meal before  bedtime. Eat at about the same times every day.  Do not drive or operate heavy machinery if you are sleepy.  Do not swim or go out on the water without a life jacket.  If possible, adjust your schedule so that you do not have to work or be active at night.  Keep all follow-up visits as directed by your health care provider. This is important. Contact a health care provider if:  You have new symptoms.  Your symptoms get worse. Get help right away if: You have serious thoughts of hurting yourself or someone else. This information is not intended to replace advice given to you by your health care provider. Make sure you discuss any questions you have with your health care provider. Document Released: 04/16/2002 Document Revised: 10/02/2015 Document Reviewed: 11/29/2013 [employer] Interactive Patient Education  2018 [employer]  

## 2017-01-13 NOTE — Progress Notes (Signed)
SLEEP MEDICINE CLINIC   Provider:  Larey Seat, M D  Referring Provider: Carollee Herter, Alferd Apa, * Primary Care Physician:  Carollee Herter, Alferd Apa, DO  Chief Complaint  Patient presents with  . Follow-up    pt alone, room 10. pt husband tells her she stops breathing when she sleeps supine. inital cpap  was started earlier this year in spring but pt was not willing to work hard with using it. Given the new information of how her husband says she sleeps she is willing to try again.     HPI:  Janet Turner is a 51 y.o. female , seen here as a referral  from Dr. Carollee Herter ,  Interval history from 01/13/2017,  [name] has a long-standing history of severe hypersomnia, which could be partially medication induced but she was also tested positive for obstructive sleep apnea at a moderate degree in 2014.  At the time CPAP seemed to be less attractive to her and she chose a weight loss surgery, assuming that this would treat the apnea  I am meeting with [name] today, who tried to use CPAP again in 2017 at home but frequently took the mask off in the middle of the night, did not get satisfying results-she  especially  didn't feel that she slept better. Her husband told her that she still gurgles in her sleep, which has scared her.  She is wanting to address alternatives to CPAP or return to CPAP but she has not used the machine for the last 5 months and actually turned in. She was using [facility]. In talking to [name] she still is excessively daytime sleepy, her current Epworth sleepiness score is 13 points, her  fatigue severity is only 16 points, her AHI was under 10.  At the time of the PSG SPLIT she had endorsed the Epworth score at 17 points and we discussed a narcolepsy work up- she denies cataplexy, dream intrusion, dream enactment and parasomnia in the interval period . Now , we don't need to address narcolepsy.  I would like for her to simply change her  sleep habits and avoid sleeping on her back. We are discussing the tennis ball methode today.  She is status post gastric sleeve- weight stable for the last 18 month. BMI is still elevated.     Consult 2017, [name] is new patient to our sleep clinic. She has a past history of hypertension, hypoglycemia, heart murmur, hypopituitarism, PTSD, hypothyroidism and a menorrhea. She also carries a diagnosis of gastroesophageal reflux disease. She had 3 times undergone eye surgery in 1968, 1970 1982. Her current chief complaint is that of excessive daytime sleepiness reflected in an Epworth score of 17 points. It turns out that she is snoring, but she has excessive daytime sleepiness but also restless legs, and that she was tested and diagnosed with sleep apnea on 02-12-2013 at [facility]. I "hear from her previous sleep study Epworth sleepiness score was endorsed at 17, insomnia severity index at 10, Becks depression scale at 7 body mass index was named at 39.5 in the meantime the patient is undergone a gastric sleeve surgery and her body mass index has significantly reduced. She was diagnosed with sleep apnea at a total AHI of 22.4 in supine, 52.0 in nonsupine and a total of 25.3. REM AHI was 12.6, total number of obstructive apneas were 25 total number of central apneas 2,  hypopneas  92. The same night  she was titrated to CPAP beginning at 4 cm water and to a final pressure of 10 cm water. I would like to add that the patient never reached 0 apneas and that the AHI index under 10 cm water was still 14.7 indicated that she did not have a complete night titration non-of the pressures explored was adequate to control all respiratory events. For this reason Dr. Jamse Arn and Dr Albertine Grates ordered an hour to titrated between 9 and 15 cm water. [name] took theCPAP prescription with her and actually used CPAP a couple of nights 4 weeks but then lost interest.  She had started to feel better and  have made the decision to undergo a weight loss surgery. For this reason she returned CPAP based on being deemed noncompliant.  On 05/28/2014 she underwent a gastric sleeve procedure and since then has lost 75 pounds. While this habits many positive effects for her overall health she remained as sleepy as before. She is now wondering if she still has sleep apnea. Chief complaint according to patient :I am sleepy .   Sleep habits are as follows:The patient usually goes to bed at 9 PM -with the help of Seroquel  400 mg which helps her to sleep. Her husband reports her to snore and gasp, gurgle. Her husband is witnessing this. She wakes at 2 AM form the difficulties to breath yet is under the influence of the medication. She gets a snack, she watches TV at night, and sometimes falls asleep with food in her mouth. She is showing other risky behavior with micowaving food at night in a sleep walking state. She will try to read to help to get back to sleep, she doesn't comprehend , dozing off, dozing out. She feels hung over. During the week she has to arise at 5:45 AM to get ready for work. She needs an alarm in the morning badly. She repeatedly uses the snooze button she is not refreshed or restored in the morning. Some mornings she will have a headache, she frequently has a dry, almost parched mouth.  She reports driving under the same medication effect. She knows that this is unsafe, and she is now willing to be retested and hopefully CPAP can control her sleepiness. She is besides the Seroquel and other medications that could affect her sleep including Synthroid, Lamictal which sometimes causes insomnia, Mirapex which can cause micro- sleep attacks, and Zoloft. Caucasian , 51 year old married female , who sleeps in the same room as her husband, cool, quiet and dark. Sleeps on her side, but always ends in supine. Sleep talking starts 120 minutes after sleep . She does not drink any caffeine containing   beverages, no alcohol, she does not use tobacco products. 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   Interval history from 10/21/2015, I have the pleasure of seeing Mrs. Jacque today following her recent sleep study. The patient underwent a polysomnography on 09/22/2015 which resulted in a mild apnea diagnosis the AHI was 9.2 the RDI was 16.9 indicating loud snoring, the AHI was not positional dependent, there were no prolonged oxygen desaturations but a short time of Carbone dioxide retention noted. Based on the very mild degree of sleep apnea I suggested that a dental device may be working for this patient rather than a CPAP machine. The patient just had retainers fixed in place is to her upper jaw so I'm not sure that she is a candidate for a dental device based on her dental needs.  She was not happy with CPAP years ago but she changed and there are many interfaces are more comfortable than she used to CPAP very briefly she felt it did help but she could just get used to this device.    Review of Systems: Out of a complete 14 system review, the patient complains of only the following symptoms, and all other reviewed systems are negative. She has not experienced sleep paralysis, she sometimes seems to have a very short dream latency, possible hypoechoic hallucinations, she has experienced cataplectic attacks when angry or upset, her knees will buckle. She fell asleep driving- was clear;y told to have CPAP in place before she continues.  She has been non compliant with CPAP - returned the machine in May but did not tell us !!  Epworth score  13  from 18 , Fatigue severity score  12 from 23  , depression score 2  Driving restricition in place.    Social History   Social History  . Marital status: Married    Spouse name: N/A  . Number of children: N/A  . Years of education: N/A   Occupational History  . Not on file.   Social History Main Topics  . Smoking status: Never Smoker  . Smokeless tobacco:  Never Used  . Alcohol use No  . Drug use: No  . Sexual activity: Yes    Partners: Male    Birth control/ protection: Post-menopausal   Other Topics Concern  . Not on file   Social History Narrative  . No narrative on file    Family History  Problem Relation Age of Onset  . Osteoarthritis Mother   . Breast cancer Mother 42       treated with bilateral mastectomies, chemo, radiation  . Other Mother 65       reported to have positive genetic testing  . Factor V Leiden deficiency Mother   . Hypertension Father   . Melanoma Father   . Depression Father        PTSD  . Cancer Father 67       melanoma; recurred and metastasized at 64  . Alcoholism Unknown   . Stroke Unknown        Paternal Family--9/12 children after age 72  . Anuerysm Unknown        Paternal family  . Hypertension Unknown        Paternal family  . Ovarian cancer Maternal Grandmother 17       treated with surgery and chemo  . Lung cancer Maternal Grandmother 73       h/o smoking  . Breast cancer Maternal Grandmother 50       mastectomy.recurence age 36 on the other, mastectomy.treated with chemo and RX  . Colon polyps Maternal Grandmother        polyposis since middle-age  . Other Maternal Grandmother 50       abdominal desmoid tumor-no surgery due to blood vessel involvement  . Prostate cancer Paternal Grandfather 9  . Factor V Leiden deficiency Sister   . Aneurysm Paternal Aunt 39       brain  . Aneurysm Maternal Grandfather 69       abdominal and cause of death  . Aneurysm Paternal Grandmother 84       brain  . Colon cancer Neg Hx   . Esophageal cancer Neg Hx   . Rectal cancer Neg Hx   . Stomach cancer Neg Hx     Past Medical  History:  Diagnosis Date  . Abnormal Pap smear of cervix 2013, 2015   positive HR HPV  . Allergy   . Amenorrhea   . Anxiety   . Chicken pox   . Complication of anesthesia    oxygen level  and blood pressure dropped wtih anterior cervical fusion surgery   .  Depression   . Family history of adverse reaction to anesthesia    mother- nausea and vomiting   . Gastritis    chronic, inactive  . Genetic testing 09/06/2016   Ms. Codner underwent genetic counseling on 08/05/2016 and genetic testing for hereditary cancer syndromes on 08/23/2016. Her results were negative for mutations in all 46 genes analyzed by [facility]'s 46-gene Common Hereditary Cancers Panel. Genes analyzed include: APC, ATM, AXIN2, BARD1, BMPR1A, BRCA1, BRCA2, BRIP1, CDH1, CDKN2A, CHEK2, CTNNA1, DICER1, EPCAM, GREM1, HOXB13, KIT, MEN1, MLH1, MSH2  . GERD (gastroesophageal reflux disease)   . Heart murmur   . Hyperlipemia   . Hypertension   . Hypoglycemia   . Hypopituitarism ([place])   . Hypothyroidism   . Infertility, female   . Leg pain   . PTSD (post-traumatic stress disorder)   . Sleep apnea    no cpap     Past Surgical History:  Procedure Laterality Date  . ANTERIOR CERVICAL DISCECTOMY  2013   Ant Cervical Fusion C4-5 with diskectomy  . BREAST REDUCTION SURGERY  96  . CESAREAN SECTION  91  . CHOLECYSTECTOMY  97  . EYE SURGERY  [phone]   x's 3   . ganglion cyst removed    . LAPAROSCOPIC GASTRIC SLEEVE RESECTION N/A 05/28/2014   Procedure: LAPAROSCOPIC GASTRIC SLEEVE RESECTION;  Surgeon: Pedro Earls, MD;  Location: [facility];  Service: General;  Laterality: N/A;  . STRABISMUS SURGERY Bilateral 12/24/2016   Procedure: BILATERAL STRABISMUS REPAIR;  Surgeon: Everitt Amber, MD;  Location: [place];  Service: Ophthalmology;  Laterality: Bilateral;  . UPPER GASTROINTESTINAL ENDOSCOPY    . UPPER GI ENDOSCOPY N/A 05/28/2014   Procedure: UPPER GI ENDOSCOPY;  Surgeon: Pedro Earls, MD;  Location: [facility];  Service: General;  Laterality: N/A;    Current Outpatient Prescriptions  Medication Sig Dispense Refill  . atorvastatin (LIPITOR) 20 MG tablet Take 1 tablet (20 mg total) by mouth daily. 90 tablet 1  . lamoTRIgine (LAMICTAL) 150 MG tablet Take 300 mg by  mouth every morning.     Marland Kitchen levothyroxine (SYNTHROID, LEVOTHROID) 75 MCG tablet TAKE 1 TABLET (75 MCG TOTAL) BY MOUTH DAILY. 90 tablet 1  . lisinopril (PRINIVIL,ZESTRIL) 10 MG tablet Take 1 tablet (10 mg total) by mouth daily. 90 tablet 3  . pantoprazole (PROTONIX) 40 MG tablet TAKE 1 TABLET (40 MG TOTAL) BY MOUTH DAILY. 30 tablet 2  . pramipexole (MIRAPEX) 1 MG tablet Take 1 tablet (1 mg total) by mouth daily. 90 tablet 1  . QUEtiapine (SEROQUEL) 200 MG tablet Take 400 mg by mouth at bedtime.   2  . sertraline (ZOLOFT) 100 MG tablet Take 150 mg by mouth every morning.      No current facility-administered medications for this visit.     Allergies as of 01/13/2017 - Review Complete 01/13/2017  Allergen Reaction Noted  . Abilify [aripiprazole] Other (See Comments) 08/05/2016  . Penicillins Hives and Itching 09/03/2012    Vitals: BP 125/74   Pulse 70   Ht _0  (1.448 m)   Wt 150 lb (68 kg)   LMP 01/15/2000   BMI 32.46 kg/m  Last Weight:  Wt Readings from Last 1 Encounters:  01/13/17 150 lb (68 kg)   SKA:JGOT mass index is 32.46 kg/m.     Last Height:   Ht Readings from Last 1 Encounters:  01/13/17 _0  (1.448 m)    Physical exam:  General: The patient is awake, alert and appears not in acute distress. The patient is well groomed. Head: Normocephalic, atraumatic. Neck is supple. Mallampati 3,  neck circumference:12 Nasal airflow free, TMJ is not evident . Retrognathia is not seen.  She wears retainers.  Cardiovascular:  Regular rate and rhythm, without  murmurs or carotid bruit, and without distended neck veins. Respiratory: Lungs are clear to auscultation. Skin:  Without evidence of edema, or rash Trunk: BMI is elevated . The patient's posture is erect.   Neurologic exam : The patient is awake and alert, oriented to place and time.   Memory subjective  described as intact.  Attention span & concentration ability appears normal.  Speech is fluent,  without dysarthria,  dysphonia or aphasia.  Mood and affect are appropriate.  Cranial nerves: Pupils are equal and briskly reactive to light. Funduscopic exam without evidence of pallor or edema.  Extraocular movements  in vertical and horizontal planes intact and without nystagmus. Visual fields by finger perimetry are intact. Hearing to finger rub intact.   Facial sensation intact to fine touch.  Facial motor strength is symmetric and tongue and uvula move midline. Shoulder shrug was symmetrical.   The patient was advised of the nature of the diagnosed sleep disorder , the treatment options and risks for general a health and wellness arising from not treating the condition.  I spent more than 25  minutes of face to face time with the patient.  Greater than 50% of time was spent in counseling and coordination of care. We have discussed the diagnosis and differential and I answered the patient's questions.     Assessment:  After physical and neurologic examination, review of laboratory studies,  Personal review of imaging studies, reports of other /same  Imaging studies ,  Results of polysomnography/ neurophysiology testing and pre-existing records as far as provided in visit., my assessment is   1) OSA- she still has very, very mild  apnea , she definitely is still a snorer and she had reported some parasomnia activities. These have resolved. I will ask her to take all night time medication by * PM and not later. She will implement the tennis ball method, we discussed this today.   c) RLS -Especially warned her about Mirapex and Requip which can cause Micro sleep attacks.Mrs. Redmann has a long-standing history of severe hypersomnia, which could be partially medication induced.    3) EDS with an Epworth score of 18/ 24 points  , Fatigue severity score 23  , depression score 2 - Driving restricition in place.    2) I will not re-order a CPAP machine- she was non compliant.  Tennis ball method.   RV with  Epworth in 6 month, NP      Larey Seat MD  01/13/2017   CC: Ann Held, [address] [place], [place] [address]

## 2017-01-25 ENCOUNTER — Encounter: Payer: Self-pay | Admitting: Family Medicine

## 2017-02-03 ENCOUNTER — Ambulatory Visit: Payer: 59 | Admitting: Nurse Practitioner

## 2017-02-26 DIAGNOSIS — H524 Presbyopia: Secondary | ICD-10-CM | POA: Diagnosis not present

## 2017-03-07 ENCOUNTER — Other Ambulatory Visit: Payer: Self-pay | Admitting: Neurology

## 2017-03-07 ENCOUNTER — Telehealth: Payer: Self-pay | Admitting: Neurology

## 2017-03-07 DIAGNOSIS — G4733 Obstructive sleep apnea (adult) (pediatric): Secondary | ICD-10-CM

## 2017-03-07 NOTE — Telephone Encounter (Signed)
Called the patient, I asked her if the tennis ball had been working for her and she stated that it had not been. I have placed an order for CPAP titration. Pt made aware and explained that the sleep lab will contact her once it has been approved thru insurance. Pt verbalized understanding. Pt had no questions at this time but was encouraged to call back if questions arise.

## 2017-03-07 NOTE — Telephone Encounter (Addendum)
Pt called her husband said when she lies on her back sleeping she is gasping for air. She is wanting to start cpap again. Please call

## 2017-03-13 ENCOUNTER — Other Ambulatory Visit: Payer: Self-pay | Admitting: Family Medicine

## 2017-03-18 ENCOUNTER — Other Ambulatory Visit: Payer: Self-pay | Admitting: Family Medicine

## 2017-03-23 ENCOUNTER — Encounter: Payer: Self-pay | Admitting: Family Medicine

## 2017-03-23 MED ORDER — ROPINIROLE HCL 0.25 MG PO TABS: ORAL_TABLET | ORAL | 0 refills | Status: DC

## 2017-03-23 NOTE — Telephone Encounter (Signed)
We can wean off the mirapex and try requip requip 0,25 po qpm x 2 days then may incr to 0.5 mg po qpm x 5 days  And we can inc from there if needed #60  Can mirapex in 1/2 for 1 week then can stop

## 2017-03-25 DIAGNOSIS — F3175 Bipolar disorder, in partial remission, most recent episode depressed: Secondary | ICD-10-CM | POA: Diagnosis not present

## 2017-03-25 MED FILL — NALTREXONE 50 MG TABLET: 50 | 30 days supply | Qty: 30 | Fill #0

## 2017-03-29 ENCOUNTER — Telehealth: Payer: Self-pay | Admitting: Family Medicine

## 2017-03-29 NOTE — Telephone Encounter (Signed)
Copied from CRM [phone]. Topic: Inquiry >> Mar 29, 2017  2:01 PM Crist InfanteHarrald, Kathy J wrote: Reason for CRM: pt's insurance [employer] called pt advise her insurance will only pay for 1 /day.  They are requesting rx be changed to one a day at a higher mg  Instead of the 2 /day

## 2017-03-30 MED ORDER — ROPINIROLE HCL 0.5 MG PO TABS: ORAL_TABLET | ORAL | 0 refills | Status: DC

## 2017-03-30 NOTE — Addendum Note (Signed)
Addended by: Thelma BargeICHARDSON, Anamae Rochelle D on: 03/30/2017 09:18 AM   Modules accepted: Orders

## 2017-03-30 NOTE — Telephone Encounter (Signed)
rx sent in for higher dose Requip 1/2 tab for 2 days then increase to 1 tab daily.

## 2017-03-31 ENCOUNTER — Other Ambulatory Visit: Payer: Self-pay | Admitting: Family Medicine

## 2017-03-31 DIAGNOSIS — K219 Gastro-esophageal reflux disease without esophagitis: Secondary | ICD-10-CM

## 2017-04-05 ENCOUNTER — Telehealth: Payer: Self-pay | Admitting: Family Medicine

## 2017-04-05 MED ORDER — ROPINIROLE HCL 0.5 MG PO TABS: ORAL_TABLET | ORAL | 0 refills | Status: DC

## 2017-04-05 NOTE — Telephone Encounter (Signed)
Rx re-sent as 90 day supply.  

## 2017-04-05 NOTE — Telephone Encounter (Signed)
Copied from CRM #[id number]. Topic: Quick Communication - See Telephone Encounter >> Apr 05, 2017  2:14 PM Arlyss Gandyichardson, Colt Martelle N, NT wrote: CRM for notification. See Telephone encounter for: [employer] is calling needing an increase in ropinirole 0.5mg  tablets to 90 days instead of 30 days since they are a mail order pharmacy. Patty CB#: [phone]  04/05/17.

## 2017-04-10 ENCOUNTER — Encounter (HOSPITAL_BASED_OUTPATIENT_CLINIC_OR_DEPARTMENT_OTHER): Payer: Self-pay | Admitting: Emergency Medicine

## 2017-04-10 ENCOUNTER — Emergency Department (HOSPITAL_BASED_OUTPATIENT_CLINIC_OR_DEPARTMENT_OTHER): Payer: 59

## 2017-04-10 ENCOUNTER — Other Ambulatory Visit: Payer: Self-pay

## 2017-04-10 ENCOUNTER — Emergency Department (HOSPITAL_BASED_OUTPATIENT_CLINIC_OR_DEPARTMENT_OTHER)
Admission: EM | Admit: 2017-04-10 | Discharge: 2017-04-10 | Disposition: A | Discharge status: Home / Self Care | Payer: 59 | Attending: Emergency Medicine | Admitting: Emergency Medicine

## 2017-04-10 DIAGNOSIS — R6883 Chills (without fever): Secondary | ICD-10-CM | POA: Diagnosis not present

## 2017-04-10 DIAGNOSIS — R1010 Upper abdominal pain, unspecified: Secondary | ICD-10-CM | POA: Insufficient documentation

## 2017-04-10 DIAGNOSIS — R1030 Lower abdominal pain, unspecified: Secondary | ICD-10-CM | POA: Diagnosis present

## 2017-04-10 DIAGNOSIS — I10 Essential (primary) hypertension: Secondary | ICD-10-CM | POA: Insufficient documentation

## 2017-04-10 DIAGNOSIS — J189 Pneumonia, unspecified organism: Secondary | ICD-10-CM

## 2017-04-10 DIAGNOSIS — R110 Nausea: Secondary | ICD-10-CM | POA: Diagnosis not present

## 2017-04-10 DIAGNOSIS — J181 Lobar pneumonia, unspecified organism: Secondary | ICD-10-CM | POA: Diagnosis not present

## 2017-04-10 DIAGNOSIS — R1031 Right lower quadrant pain: Secondary | ICD-10-CM | POA: Diagnosis not present

## 2017-04-10 DIAGNOSIS — E039 Hypothyroidism, unspecified: Secondary | ICD-10-CM | POA: Diagnosis not present

## 2017-04-10 DIAGNOSIS — N390 Urinary tract infection, site not specified: Secondary | ICD-10-CM | POA: Insufficient documentation

## 2017-04-10 DIAGNOSIS — R918 Other nonspecific abnormal finding of lung field: Secondary | ICD-10-CM | POA: Diagnosis not present

## 2017-04-10 DIAGNOSIS — Z79899 Other long term (current) drug therapy: Secondary | ICD-10-CM | POA: Insufficient documentation

## 2017-04-10 DIAGNOSIS — R109 Unspecified abdominal pain: Secondary | ICD-10-CM | POA: Diagnosis not present

## 2017-04-10 LAB — URINALYSIS, MICROSCOPIC (REFLEX): RBC / HPF: NONE SEEN RBC/hpf (ref 0–5)

## 2017-04-10 LAB — COMPREHENSIVE METABOLIC PANEL
ALBUMIN: 3.7 g/dL (ref 3.5–5.0)
ALT: 26 U/L (ref 14–54)
ANION GAP: 7 (ref 5–15)
AST: 28 U/L (ref 15–41)
Alkaline Phosphatase: 116 U/L (ref 38–126)
BILIRUBIN TOTAL: 0.5 mg/dL (ref 0.3–1.2)
BUN: 19 mg/dL (ref 6–20)
CHLORIDE: 103 mmol/L (ref 101–111)
CO2: 26 mmol/L (ref 22–32)
Calcium: 9.1 mg/dL (ref 8.9–10.3)
Creatinine, Ser: 1.11 mg/dL — ABNORMAL HIGH (ref 0.44–1.00)
GFR calc Af Amer: >60 mL/min (ref >60)
GFR calc non Af Amer: 56 mL/min — ABNORMAL LOW (ref >60)
GLUCOSE: 91 mg/dL (ref 65–99)
POTASSIUM: 3.9 mmol/L (ref 3.5–5.1)
SODIUM: 136 mmol/L (ref 135–145)
TOTAL PROTEIN: 7.2 g/dL (ref 6.5–8.1)

## 2017-04-10 LAB — URINALYSIS, ROUTINE W REFLEX MICROSCOPIC
Bilirubin Urine: NEGATIVE
Glucose, UA: 100 mg/dL — AB
HGB URINE DIPSTICK: NEGATIVE
Ketones, ur: NEGATIVE mg/dL
Nitrite: POSITIVE — AB
PH: 6 (ref 5.0–8.0)
PROTEIN: NEGATIVE mg/dL
Specific Gravity, Urine: 1.02 (ref 1.005–1.030)

## 2017-04-10 LAB — CBC
HEMATOCRIT: 35.2 % — AB (ref 36.0–46.0)
HEMOGLOBIN: 11.4 g/dL — AB (ref 12.0–15.0)
MCH: 30.4 pg (ref 26.0–34.0)
MCHC: 32.4 g/dL (ref 30.0–36.0)
MCV: 93.9 fL (ref 78.0–100.0)
Platelets: 251 10*3/uL (ref 150–400)
RBC: 3.75 MIL/uL — ABNORMAL LOW (ref 3.87–5.11)
RDW: 12.2 % (ref 11.5–15.5)
WBC: 16.6 10*3/uL — ABNORMAL HIGH (ref 4.0–10.5)

## 2017-04-10 LAB — LIPASE, BLOOD: Lipase: 25 U/L (ref 11–51)

## 2017-04-10 MED ORDER — HYDROCODONE-ACETAMINOPHEN 5-325 MG PO TABS: 1.0000 | ORAL_TABLET | Freq: Four times a day (QID) | ORAL | 0 refills | Status: DC | PRN

## 2017-04-10 MED ORDER — ONDANSETRON HCL 4 MG/2ML IJ SOLN
4.0000 mg | Freq: Once | INTRAMUSCULAR | Status: AC
  Administered 2017-04-10: 4 mg via INTRAVENOUS
  Filled 2017-04-10: qty 2

## 2017-04-10 MED ORDER — LEVOFLOXACIN 500 MG PO TABS: 500.0000 mg | ORAL_TABLET | Freq: Every day | ORAL | 0 refills | Status: DC

## 2017-04-10 MED ORDER — SODIUM CHLORIDE 0.9 % IV BOLUS (SEPSIS)
500.0000 mL | Freq: Once | INTRAVENOUS | Status: AC
  Administered 2017-04-10: 500 mL via INTRAVENOUS

## 2017-04-10 MED ORDER — FENTANYL CITRATE (PF) 100 MCG/2ML IJ SOLN
50.0000 ug | Freq: Once | INTRAMUSCULAR | Status: DC
  Filled 2017-04-10: qty 2

## 2017-04-10 MED ORDER — IOPAMIDOL (ISOVUE-300) INJECTION 61%
100.0000 mL | Freq: Once | INTRAVENOUS | Status: AC | PRN
  Administered 2017-04-10: 100 mL via INTRAVENOUS

## 2017-04-10 NOTE — ED Notes (Signed)
ED Provider at bedside. 

## 2017-04-10 NOTE — ED Provider Notes (Signed)
[facility] EMERGENCY DEPARTMENT Provider Note   CSN: [id number] Arrival date & time: 04/10/17  1358     History   Chief Complaint Chief Complaint  Patient presents with  . Abdominal Pain    HPI Janet Turner is a 51 y.o. female.  HPI Patient presents with restless legs and lower abdominal pain.  States the leg started having more problems over the last week.  States she has more difficulty controlling them particularly at night.  States this does not feel like her left restless legs feels like she just has to keep her legs moving.  States she has been taking Motrin to help with the pain.  Now started having abdominal pain yesterday.  Worse in the right lower quadrant but does also go to the upper abdomen.  Some nausea.  Has had some chills without a frank fever.  No dysuria. Past Medical History:  Diagnosis Date  . Abnormal Pap smear of cervix 2013, 2015   positive HR HPV  . Allergy   . Amenorrhea   . Anxiety   . Chicken pox   . Complication of anesthesia    oxygen level  and blood pressure dropped wtih anterior cervical fusion surgery   . Depression   . Family history of adverse reaction to anesthesia    mother- nausea and vomiting   . Gastritis    chronic, inactive  . Genetic testing 09/06/2016   Ms. Rohlfs underwent genetic counseling on 08/05/2016 and genetic testing for hereditary cancer syndromes on 08/23/2016. Her results were negative for mutations in all 46 genes analyzed by [facility]'s 46-gene Common Hereditary Cancers Panel. Genes analyzed include: APC, ATM, AXIN2, BARD1, BMPR1A, BRCA1, BRCA2, BRIP1, CDH1, CDKN2A, CHEK2, CTNNA1, DICER1, EPCAM, GREM1, HOXB13, KIT, MEN1, MLH1, MSH2  . GERD (gastroesophageal reflux disease)   . Heart murmur   . Hyperlipemia   . Hypertension   . Hypoglycemia   . Hypopituitarism ([place])   . Hypothyroidism   . Infertility, female   . Leg pain   . PTSD (post-traumatic stress disorder)   . Sleep apnea    no  cpap     Patient Active Problem List   Diagnosis Date Noted  . Genetic testing 09/06/2016  . Intractable migraine without aura and with status migrainosus 02/24/2016  . Benign paroxysmal positional vertigo 12/01/2015  . Primary snoring 10/21/2015  . Hypersomnia with sleep apnea 09/03/2015  . Snoring 09/03/2015  . Cataplexy 09/03/2015  . Right knee pain 07/22/2015  . Right arm weakness 02/25/2015  . B12 deficiency 11/19/2014  . Restless leg 11/19/2014  . Status post laparoscopic sleeve gastrectomy+HH repair Jan 2016 05/30/2014  . Morbid obesity ([place]) 05/28/2014  . Undiagnosed cardiac murmurs 03/21/2014  . Gastroesophageal reflux disease without esophagitis 03/19/2014  . Bipolar 1 disorder, mixed, moderate ([place]) 01/18/2014  . Hyponatremia 11/27/2013  . History of bipolar disorder 11/27/2013  . Encephalopathy 11/27/2013  . HTN (hypertension) 07/23/2013  . Hyperlipidemia LDL goal <100 07/23/2013  . Hypothyroidism 07/23/2013  . PTSD (post-traumatic stress disorder) 07/23/2013  . 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 Obesity (BMI 30-39.9) 07/23/2013  . Ganglion cyst 07/23/2013    Past Surgical History:  Procedure Laterality Date  . ANTERIOR CERVICAL DISCECTOMY  2013   Ant Cervical Fusion C4-5 with diskectomy  . BREAST REDUCTION SURGERY  96  . CESAREAN SECTION  91  . CHOLECYSTECTOMY  97  . EYE SURGERY  [phone]   x's 3   . ganglion cyst removed    .  LAPAROSCOPIC GASTRIC SLEEVE RESECTION N/A 05/28/2014   Procedure: LAPAROSCOPIC GASTRIC SLEEVE RESECTION;  Surgeon: Pedro Earls, MD;  Location: [facility];  Service: General;  Laterality: N/A;  . STRABISMUS SURGERY Bilateral 12/24/2016   Procedure: BILATERAL STRABISMUS REPAIR;  Surgeon: Everitt Amber, MD;  Location: [facility];  Service: Ophthalmology;  Laterality: Bilateral;  . UPPER GASTROINTESTINAL ENDOSCOPY    . UPPER GI ENDOSCOPY N/A 05/28/2014   Procedure: UPPER GI ENDOSCOPY;  Surgeon: Pedro Earls,  MD;  Location: [facility];  Service: General;  Laterality: N/A;    OB History    Gravida Para Term Preterm AB Living   '6 1 1 '$ 0 5 1   SAB TAB Ectopic Multiple Live Births   5 0 0 0 1      Obstetric Comments   1 adopted daughter       Home Medications    Prior to Admission medications   Medication Sig Start Date End Date Taking? Authorizing Provider  atorvastatin (LIPITOR) 20 MG tablet TAKE 1 TABLET (20 MG TOTAL) BY MOUTH DAILY. 03/18/17   Ann Held, DO  HYDROcodone-acetaminophen (NORCO/VICODIN) 5-325 MG tablet Take 1-2 tablets by mouth every 6 (six) hours as needed. 04/10/17   Davonna Belling, MD  lamoTRIgine (LAMICTAL) 150 MG tablet Take 300 mg by mouth every morning.     [provider]  levofloxacin (LEVAQUIN) 500 MG tablet Take 1 tablet (500 mg total) by mouth daily. 04/10/17   Davonna Belling, MD  levothyroxine (SYNTHROID, LEVOTHROID) 75 MCG tablet TAKE 1 TABLET (75 MCG TOTAL) BY MOUTH DAILY. 10/26/16   Ann Held, DO  lisinopril (PRINIVIL,ZESTRIL) 10 MG tablet Take 1 tablet (10 mg total) by mouth daily. 10/26/16   Roma Schanz R, DO  pantoprazole (PROTONIX) 40 MG tablet TAKE 1 TABLET (40 MG TOTAL) BY MOUTH DAILY. 04/05/17   Roma Schanz R, DO  pramipexole (MIRAPEX) 1 MG tablet TAKE 1 TABLET BY MOUTH ONCE A DAY 03/14/17   Carollee Herter, Yvonne R, DO  QUEtiapine (SEROQUEL) 200 MG tablet Take 400 mg by mouth at bedtime.  09/22/16   [provider]  rOPINIRole (REQUIP) 0.5 MG tablet Take 1/2 tablet every evening for 2 days, then increase to 1 tablet every evening 04/05/17   Carollee Herter, Alferd Apa, DO  sertraline (ZOLOFT) 100 MG tablet Take 150 mg by mouth every morning.  12/18/13   Ann Held, DO    Family History Family History  Problem Relation Age of Onset  . Osteoarthritis Mother   . Breast cancer Mother 74       treated with bilateral mastectomies, chemo, radiation  . Other Mother 51       reported to have positive  genetic testing  . Factor V Leiden deficiency Mother   . Hypertension Father   . Melanoma Father   . Depression Father        PTSD  . Cancer Father 51       melanoma; recurred and metastasized at 81  . Alcoholism Unknown   . Stroke Unknown        Paternal Family--9/12 children after age 63  . Anuerysm Unknown        Paternal family  . Hypertension Unknown        Paternal family  . Ovarian cancer Maternal Grandmother 53       treated with surgery and chemo  . Lung cancer Maternal Grandmother 70  h/o smoking  . Breast cancer Maternal Grandmother 50       mastectomy.recurence age 7 on the other, mastectomy.treated with chemo and RX  . Colon polyps Maternal Grandmother        polyposis since middle-age  . Other Maternal Grandmother 50       abdominal desmoid tumor-no surgery due to blood vessel involvement  . Prostate cancer Paternal Grandfather 54  . Factor V Leiden deficiency Sister   . Aneurysm Paternal Aunt 96       brain  . Aneurysm Maternal Grandfather 69       abdominal and cause of death  . Aneurysm Paternal Grandmother 24       brain  . Colon cancer Neg Hx   . Esophageal cancer Neg Hx   . Rectal cancer Neg Hx   . Stomach cancer Neg Hx     Social History Social History   Tobacco Use  . Smoking status: Never Smoker  . Smokeless tobacco: Never Used  Substance Use Topics  . Alcohol use: No    Alcohol/week: 0.0 oz  . Drug use: No     Allergies   Abilify [aripiprazole] and Penicillins   Review of Systems Review of Systems  Constitutional: Positive for appetite change.  HENT: Negative for congestion.   Gastrointestinal: Positive for abdominal pain and nausea. Negative for constipation and vomiting.  Genitourinary: Negative for dysuria.  Musculoskeletal: Negative for back pain.  Neurological: Negative for seizures and headaches.  Hematological: Negative for adenopathy.  Psychiatric/Behavioral: Negative for confusion.     Physical  Exam Updated Vital Signs BP 107/60 (BP Location: Left Arm)   Pulse 66   Temp 98.8 F (37.1 C) (Oral)   Resp 17   Ht _0  (1.448 m)   Wt 69.4 kg (153 lb)   LMP 01/15/2000   SpO2 96%   BMI 33.11 kg/m   Physical Exam  Constitutional: She appears well-developed.  HENT:  Head: Normocephalic and atraumatic.  Eyes: EOM are normal. Pupils are equal, round, and reactive to light.  Cardiovascular: Normal rate.  Pulmonary/Chest: Breath sounds normal.  Abdominal: Normal appearance. There is tenderness.  Mild upper abdominal tenderness but worse tenderness right lower quadrant.  No rebound or guarding.  No hernias palpated.  Neurological: She is alert.  Skin: Skin is warm. Capillary refill takes less than 2 seconds.  Psychiatric: She has a normal mood and affect.     ED Treatments / Results  Labs (all labs ordered are listed, but only abnormal results are displayed) Labs Reviewed  URINALYSIS, ROUTINE W REFLEX MICROSCOPIC - Abnormal; Notable for the following components:      Result Value   APPearance CLOUDY (*)    Glucose, UA 100 (*)    Nitrite POSITIVE (*)    Leukocytes, UA MODERATE (*)    All other components within normal limits  URINALYSIS, MICROSCOPIC (REFLEX) - Abnormal; Notable for the following components:   Bacteria, UA MANY (*)    Squamous Epithelial / LPF 0-5 (*)    All other components within normal limits  COMPREHENSIVE METABOLIC PANEL - Abnormal; Notable for the following components:   Creatinine, Ser 1.11 (*)    GFR calc non Af Amer 56 (*)    All other components within normal limits  CBC - Abnormal; Notable for the following components:   WBC 16.6 (*)    RBC 3.75 (*)    Hemoglobin 11.4 (*)    HCT 35.2 (*)    All other components  within normal limits  LIPASE, BLOOD    EKG  EKG Interpretation None       Radiology Dg Chest 2 View  Result Date: 04/10/2017 CLINICAL DATA:  Fever, nausea, and abdominal pain. EXAM: CHEST  2 VIEW COMPARISON:  03/15/2014  FINDINGS: The heart size and mediastinal contours are within normal limits. New opacity is seen in the posterior left lower lobe on the lateral view, likely in the right lower lobe. This is suspicious for pneumonia. No evidence of pleural effusion. IMPRESSION: Posterior right lower lobe infiltrate, suspicious for pneumonia. Recommend followup PA and lateral chest X-ray in 3-4 weeks to ensure resolution. Electronically Signed   By: Earle Gell M.D.   On: 04/10/2017 19:09   Ct Abdomen Pelvis W Contrast  Result Date: 04/10/2017 CLINICAL DATA:  Acute onset abdominal pain and nausea this morning. EXAM: CT ABDOMEN AND PELVIS WITH CONTRAST TECHNIQUE: Multidetector CT imaging of the abdomen and pelvis was performed using the standard protocol following bolus administration of intravenous contrast. CONTRAST:  18m ISOVUE-300 IOPAMIDOL (ISOVUE-300) INJECTION 61% COMPARISON:  09/08/2012 FINDINGS: Lower Chest: Airspace disease is seen in both lower lobes, right side greater than left, suspicious for pneumonia. Hepatobiliary: No hepatic masses identified. Prior cholecystectomy. No evidence of biliary obstruction. Pancreas:  No mass or inflammatory changes. Spleen: Within normal limits in size and appearance. Adrenals/Urinary Tract: No masses identified. No evidence of hydronephrosis. Stomach/Bowel: Previous sleeve gastrectomy noted. No evidence of obstruction, inflammatory process or abnormal fluid collections. Normal appendix visualized. Vascular/Lymphatic: No pathologically enlarged lymph nodes. No abdominal aortic aneurysm. Aortic atherosclerosis. Reproductive:  No mass or other significant abnormality. Other:  None. Musculoskeletal:  No suspicious bone lesions identified. IMPRESSION: No acute findings within the abdomen or pelvis. Right greater than left lower lobe airspace disease, suspicious for pneumonia. Recommend clinical correlation and chest radiographic followup. Electronically Signed   By: JEarle GellM.D.    On: 04/10/2017 18:30    Procedures Procedures (including critical care time)  Medications Ordered in ED Medications  fentaNYL (SUBLIMAZE) injection 50 mcg (50 mcg Intravenous Not Given 04/10/17 1744)  sodium chloride 0.9 % bolus 500 mL (0 mLs Intravenous Stopped 04/10/17 1911)  ondansetron (ZOFRAN) injection 4 mg (4 mg Intravenous Given 04/10/17 1739)  iopamidol (ISOVUE-300) 61 % injection 100 mL (100 mLs Intravenous Contrast Given 04/10/17 1747)     Initial Impression / Assessment and Plan / ED Course  I have reviewed the triage vital signs and the nursing notes.  Pertinent labs & imaging results that were available during my care of the patient were reviewed by me and considered in my medical decision making (see chart for details).     Patient with lower abdominal pain and leukocytosis.  Found to have likely urinary tract infection.  However with the pain CT scan done and showed possible pneumonia.  X-ray also showed pneumonia.  Has not really had much of a cough but with fevers chills and generalized malaise will treat with Levaquin which would cover both urine and pneumonia.  Discharge home and follow-up with PCP.  Previous QT reviewed and not prolonged.  Final Clinical Impressions(s) / ED Diagnoses   Final diagnoses:  Lower urinary tract infectious disease  Community acquired pneumonia of right lower lobe of lung ([facility]    ED Discharge Orders        Ordered    levofloxacin (LEVAQUIN) 500 MG tablet  Daily     04/10/17 2009    HYDROcodone-acetaminophen (NORCO/VICODIN) 5-325 MG tablet  Every 6 hours  PRN     04/10/17 2010       Davonna Belling, MD 04/10/17 2011

## 2017-04-10 NOTE — ED Notes (Signed)
IV flushed, and infusing.

## 2017-04-10 NOTE — ED Triage Notes (Signed)
Patient states that for the last 2 -3 nights she has had pain to her bilateral lower legs  - hx of restless legs, but the medications are not helping. She now has pain to her right lower abdominal area

## 2017-04-10 NOTE — ED Notes (Signed)
Returned from ultrasound.

## 2017-04-11 ENCOUNTER — Encounter: Payer: Self-pay | Admitting: Family Medicine

## 2017-04-20 ENCOUNTER — Encounter: Payer: Self-pay | Admitting: Family Medicine

## 2017-04-20 NOTE — Telephone Encounter (Signed)
Make sure she is not taking mirapex and requip---  We should have weaned off one to put her on the other We may be able to inc requip She can also try [place] for leg cramps-- she can get this at [facility] or [address]

## 2017-05-04 ENCOUNTER — Other Ambulatory Visit: Payer: Self-pay | Admitting: Family Medicine

## 2017-05-04 DIAGNOSIS — K219 Gastro-esophageal reflux disease without esophagitis: Secondary | ICD-10-CM

## 2017-05-12 NOTE — Telephone Encounter (Signed)
°  Relation to pt: self  Call back number: [phone]    Reason for call:  Patient states rOPINIRole (REQUIP) 0.5 MG tablet is not working experiencing restless legs and experienced leg cramping on Sunday 05/08/17, patient seeking clinical advice or alternate Rx, please advise

## 2017-05-20 ENCOUNTER — Encounter: Payer: Self-pay | Admitting: Family Medicine

## 2017-05-20 ENCOUNTER — Ambulatory Visit (INDEPENDENT_AMBULATORY_CARE_PROVIDER_SITE_OTHER): Payer: 59 | Admitting: Family Medicine

## 2017-05-20 VITALS — BP 130/62 | HR 68 | Temp 98.4°F | Resp 16 | Ht <= 58 in | Wt 157.6 lb

## 2017-05-20 DIAGNOSIS — E538 Deficiency of other specified B group vitamins: Secondary | ICD-10-CM

## 2017-05-20 DIAGNOSIS — G2581 Restless legs syndrome: Secondary | ICD-10-CM | POA: Diagnosis not present

## 2017-05-20 MED ORDER — CYANOCOBALAMIN 1000 MCG/ML IJ SOLN: INTRAMUSCULAR | 5 refills | Status: AC

## 2017-05-20 MED FILL — CYANOCOBALAMIN 1,000 MCG/ML: 1000 | 30 days supply | Qty: 1 | Fill #0

## 2017-05-20 NOTE — Patient Instructions (Signed)

## 2017-05-20 NOTE — Progress Notes (Signed)
Patient ID: Janet Turner, female    DOB: July 25, 1965  Age: 52 y.o. MRN: [id number]    Subjective:  Subjective  HPI Janet Turner presents for rls-- the requip is not working She is taking 0.5 mg 2 po qhs She is also not able to get here for b12 injections   Review of Systems  Constitutional: Negative for activity change, appetite change, fatigue and unexpected weight change.  Respiratory: Negative for cough and shortness of breath.   Cardiovascular: Negative for chest pain and palpitations.  Psychiatric/Behavioral: Negative for behavioral problems and dysphoric mood. The patient is not nervous/anxious.     History Past Medical History:  Diagnosis Date  . Abnormal Pap smear of cervix 2013, 2015   positive HR HPV  . Allergy   . Amenorrhea   . Anxiety   . Chicken pox   . Complication of anesthesia    oxygen level  and blood pressure dropped wtih anterior cervical fusion surgery   . Depression   . Family history of adverse reaction to anesthesia    mother- nausea and vomiting   . Gastritis    chronic, inactive  . Genetic testing 09/06/2016   Ms. Jennette underwent genetic counseling on 08/05/2016 and genetic testing for hereditary cancer syndromes on 08/23/2016. Her results were negative for mutations in all 46 genes analyzed by [facility]'s 46-gene Common Hereditary Cancers Panel. Genes analyzed include: APC, ATM, AXIN2, BARD1, BMPR1A, BRCA1, BRCA2, BRIP1, CDH1, CDKN2A, CHEK2, CTNNA1, DICER1, EPCAM, GREM1, HOXB13, KIT, MEN1, MLH1, MSH2  . GERD (gastroesophageal reflux disease)   . Heart murmur   . Hyperlipemia   . Hypertension   . Hypoglycemia   . Hypopituitarism ([place])   . Hypothyroidism   . Infertility, female   . Leg pain   . PTSD (post-traumatic stress disorder)   . Sleep apnea    no cpap     She has a past surgical history that includes Eye surgery ([age]); Cholecystectomy (97); Cesarean section (91); Breast reduction surgery (96); Anterior  cervical discectomy (2013); ganglion cyst removed; Laparoscopic gastric sleeve resection (N/A, 05/28/2014); Upper gi endoscopy (N/A, 05/28/2014); Upper gastrointestinal endoscopy; and Strabismus surgery (Bilateral, 12/24/2016).   Her family history includes Alcoholism in her unknown relative; Aneurysm (age of onset: 3) in her paternal aunt and paternal grandmother; Aneurysm (age of onset: 80) in her maternal grandfather; Anuerysm in her unknown relative; Breast cancer (age of onset: 51) in her maternal grandmother; Breast cancer (age of onset: 58) in her mother; Cancer (age of onset: 84) in her father; Colon polyps in her maternal grandmother; Depression in her father; Factor V Leiden deficiency in her mother and sister; Hypertension in her father and unknown relative; Lung cancer (age of onset: 83) in her maternal grandmother; Melanoma in her father; Osteoarthritis in her mother; Other (age of onset: 37) in her maternal grandmother; Other (age of onset: 60) in her mother; Ovarian cancer (age of onset: 39) in her maternal grandmother; Prostate cancer (age of onset: 42) in her paternal grandfather; Stroke in her unknown relative.She reports that  has never smoked. she has never used smokeless tobacco. She reports that she does not drink alcohol or use drugs.  Current Outpatient Medications on File Prior to Visit  Medication Sig Dispense Refill  . atorvastatin (LIPITOR) 20 MG tablet Take 1 tablet (20 mg total) by mouth daily. 180 tablet 0  . lamoTRIgine (LAMICTAL) 150 MG tablet Take 300 mg by mouth every morning.     Marland Kitchen levothyroxine (SYNTHROID, LEVOTHROID) 75  MCG tablet TAKE 1 TABLET (75 MCG TOTAL) BY MOUTH DAILY. 90 tablet 1  . lisinopril (PRINIVIL,ZESTRIL) 10 MG tablet Take 1 tablet (10 mg total) by mouth daily. 90 tablet 3  . pantoprazole (PROTONIX) 40 MG tablet Take 1 tablet (40 mg total) by mouth daily. 90 tablet 0  . QUEtiapine (SEROQUEL) 200 MG tablet Take 400 mg by mouth at bedtime.   2  . rOPINIRole  (REQUIP) 0.5 MG tablet Take 1/2 tablet every evening for 2 days, then increase to 1 tablet every evening 90 tablet 0  . sertraline (ZOLOFT) 100 MG tablet Take 150 mg by mouth every morning.     Marland Kitchen HYDROcodone-acetaminophen (NORCO/VICODIN) 5-325 MG tablet Take 1-2 tablets by mouth every 6 (six) hours as needed. (Patient not taking: Reported on 05/20/2017) 6 tablet 0  . levofloxacin (LEVAQUIN) 500 MG tablet Take 1 tablet (500 mg total) by mouth daily. (Patient not taking: Reported on 05/20/2017) 7 tablet 0   No current facility-administered medications on file prior to visit.      Objective:  Objective  Physical Exam  Constitutional: She is oriented to person, place, and time. She appears well-developed and well-nourished. No distress.  HENT:  Head: Normocephalic and atraumatic.  Nose: Nose normal.  Eyes: Right eye exhibits no discharge. Left eye exhibits no discharge.  Neck: Normal range of motion. Neck supple.  Cardiovascular: Normal rate and regular rhythm.  No murmur heard. Pulmonary/Chest: Effort normal and breath sounds normal.  Abdominal: Soft. Bowel sounds are normal. There is no tenderness.  Musculoskeletal: She exhibits no edema.  Neurological: She is alert and oriented to person, place, and time.  Skin: Skin is warm and dry.  Psychiatric: She has a normal mood and affect. Her behavior is normal. Judgment and thought content normal.  Nursing note and vitals reviewed.  BP 130/62 (BP Location: Left Arm, Patient Position: Sitting, Cuff Size: Large)   Pulse 68   Temp 98.4 F (36.9 C) (Oral)   Resp 16   Ht 4' 9" (1.448 m)   Wt 157 lb 9.6 oz (71.5 kg)   LMP 01/15/2000   SpO2 98%   BMI 34.10 kg/m  Wt Readings from Last 3 Encounters:  05/20/17 157 lb 9.6 oz (71.5 kg)  04/10/17 153 lb (69.4 kg)  01/13/17 150 lb (68 kg)     Lab Results  Component Value Date   WBC 10.0 05/20/2017   HGB 11.2 (L) 05/20/2017   HCT 33.4 (L) 05/20/2017   PLT 256 05/20/2017   GLUCOSE 91  04/10/2017   CHOL 151 08/05/2016   TRIG 84 08/05/2016   HDL 102 08/05/2016   LDLCALC 32 08/05/2016   ALT 26 04/10/2017   AST 28 04/10/2017   NA 136 04/10/2017   K 3.9 04/10/2017   CL 103 04/10/2017   CREATININE 1.11 (H) 04/10/2017   BUN 19 04/10/2017   CO2 26 04/10/2017   TSH 0.61 08/05/2016   HGBA1C 5.4 09/19/2014   MICROALBUR <0.7 09/19/2014    Dg Chest 2 View  Result Date: 04/10/2017 CLINICAL DATA:  Fever, nausea, and abdominal pain. EXAM: CHEST  2 VIEW COMPARISON:  03/15/2014 FINDINGS: The heart size and mediastinal contours are within normal limits. New opacity is seen in the posterior left lower lobe on the lateral view, likely in the right lower lobe. This is suspicious for pneumonia. No evidence of pleural effusion. IMPRESSION: Posterior right lower lobe infiltrate, suspicious for pneumonia. Recommend followup PA and lateral chest X-ray in 3-4 weeks to ensure  resolution. Electronically Signed   By: Earle Gell M.D.   On: 04/10/2017 19:09   Ct Abdomen Pelvis W Contrast  Result Date: 04/10/2017 CLINICAL DATA:  Acute onset abdominal pain and nausea this morning. EXAM: CT ABDOMEN AND PELVIS WITH CONTRAST TECHNIQUE: Multidetector CT imaging of the abdomen and pelvis was performed using the standard protocol following bolus administration of intravenous contrast. CONTRAST:  175m ISOVUE-300 IOPAMIDOL (ISOVUE-300) INJECTION 61% COMPARISON:  09/08/2012 FINDINGS: Lower Chest: Airspace disease is seen in both lower lobes, right side greater than left, suspicious for pneumonia. Hepatobiliary: No hepatic masses identified. Prior cholecystectomy. No evidence of biliary obstruction. Pancreas:  No mass or inflammatory changes. Spleen: Within normal limits in size and appearance. Adrenals/Urinary Tract: No masses identified. No evidence of hydronephrosis. Stomach/Bowel: Previous sleeve gastrectomy noted. No evidence of obstruction, inflammatory process or abnormal fluid collections. Normal appendix  visualized. Vascular/Lymphatic: No pathologically enlarged lymph nodes. No abdominal aortic aneurysm. Aortic atherosclerosis. Reproductive:  No mass or other significant abnormality. Other:  None. Musculoskeletal:  No suspicious bone lesions identified. IMPRESSION: No acute findings within the abdomen or pelvis. Right greater than left lower lobe airspace disease, suspicious for pneumonia. Recommend clinical correlation and chest radiographic followup. Electronically Signed   By: JEarle GellM.D.   On: 04/10/2017 18:30     Assessment & Plan:  Plan  I have discontinued CTharon Kitch Loney's pramipexole. I am also having her start on cyanocobalamin. Additionally, I am having her maintain her sertraline, lamoTRIgine, QUEtiapine, lisinopril, levothyroxine, rOPINIRole, levofloxacin, HYDROcodone-acetaminophen, pantoprazole, and atorvastatin.  Meds ordered this encounter  Medications  . cyanocobalamin (,VITAMIN B-12,) 1000 MCG/ML injection    Sig: 1 ml SQ qmonth    Dispense:  1 mL    Refill:  5    Problem List Items Addressed This Visit      Unprioritized   B12 deficiency   Relevant Medications   cyanocobalamin (,VITAMIN B-12,) 1000 MCG/ML injection   Other Relevant Orders   Iron, TIBC and Ferritin Panel (Completed)   CBC w/Diff (Completed)    Other Visit Diagnoses    RLS (restless legs syndrome)    -  Primary   Relevant Orders   Iron, TIBC and Ferritin Panel (Completed)   CBC w/Diff (Completed)      Follow-up: Return in about 3 months (around 08/18/2017), or if symptoms worsen or fail to improve.  YAnn Held DO

## 2017-05-21 LAB — CBC WITH DIFFERENTIAL/PLATELET
BASOS PCT: 0.7 %
Basophils Absolute: 70 cells/uL (ref 0–200)
EOS PCT: 2.3 %
Eosinophils Absolute: 230 cells/uL (ref 15–500)
HEMATOCRIT: 33.4 % — AB (ref 35.0–45.0)
Hemoglobin: 11.2 g/dL — ABNORMAL LOW (ref 11.7–15.5)
LYMPHS ABS: 2240 {cells}/uL (ref 850–3900)
MCH: 29.7 pg (ref 27.0–33.0)
MCHC: 33.5 g/dL (ref 32.0–36.0)
MCV: 88.6 fL (ref 80.0–100.0)
MPV: 10.2 fL (ref 7.5–12.5)
Monocytes Relative: 5.3 %
NEUTROS ABS: 6930 {cells}/uL (ref 1500–7800)
Neutrophils Relative %: 69.3 %
Platelets: 256 10*3/uL (ref 140–400)
RBC: 3.77 10*6/uL — AB (ref 3.80–5.10)
RDW: 11.8 % (ref 11.0–15.0)
Total Lymphocyte: 22.4 %
WBC: 10 10*3/uL (ref 3.8–10.8)
WBCMIX: 530 {cells}/uL (ref 200–950)

## 2017-05-21 LAB — IRON,TIBC AND FERRITIN PANEL
%SAT: 16 % (calc) (ref 11–50)
FERRITIN: 48 ng/mL (ref 10–232)
IRON: 50 ug/dL (ref 45–160)
TIBC: 310 mcg/dL (calc) (ref 250–450)

## 2017-05-22 ENCOUNTER — Other Ambulatory Visit: Payer: Self-pay | Admitting: Family Medicine

## 2017-05-22 DIAGNOSIS — D508 Other iron deficiency anemias: Secondary | ICD-10-CM

## 2017-05-22 MED ORDER — FERROUS SULFATE 220 (44 FE) MG/5ML PO ELIX: 220.0000 mg | ORAL_SOLUTION | Freq: Every day | ORAL | 3 refills | Status: DC

## 2017-05-23 ENCOUNTER — Encounter: Payer: Self-pay | Admitting: Family Medicine

## 2017-05-23 MED FILL — FERROUS SULF 220 MG/5 ML EL: 220 (44 FE) | 30 days supply | Qty: 150 | Fill #0

## 2017-05-24 ENCOUNTER — Emergency Department (INDEPENDENT_AMBULATORY_CARE_PROVIDER_SITE_OTHER): Payer: 59

## 2017-05-24 ENCOUNTER — Other Ambulatory Visit: Payer: Self-pay

## 2017-05-24 ENCOUNTER — Emergency Department (INDEPENDENT_AMBULATORY_CARE_PROVIDER_SITE_OTHER)
Admission: EM | Admit: 2017-05-24 | Discharge: 2017-05-24 | Disposition: A | Discharge status: Home / Self Care | Payer: 59 | Source: Home / Self Care

## 2017-05-24 ENCOUNTER — Encounter: Payer: Self-pay | Admitting: Emergency Medicine

## 2017-05-24 ENCOUNTER — Ambulatory Visit: Payer: Self-pay

## 2017-05-24 ENCOUNTER — Encounter: Payer: Self-pay | Admitting: Family Medicine

## 2017-05-24 DIAGNOSIS — G2581 Restless legs syndrome: Secondary | ICD-10-CM

## 2017-05-24 DIAGNOSIS — M5416 Radiculopathy, lumbar region: Secondary | ICD-10-CM | POA: Diagnosis not present

## 2017-05-24 DIAGNOSIS — M48061 Spinal stenosis, lumbar region without neurogenic claudication: Secondary | ICD-10-CM | POA: Diagnosis not present

## 2017-05-24 DIAGNOSIS — M545 Low back pain, unspecified: Secondary | ICD-10-CM

## 2017-05-24 MED ORDER — DIAZEPAM 5 MG PO TABS: 5.0000 mg | ORAL_TABLET | Freq: Two times a day (BID) | ORAL | 0 refills | Status: DC

## 2017-05-24 MED ORDER — HYDROCODONE-ACETAMINOPHEN 5-325 MG PO TABS: 2.0000 | ORAL_TABLET | ORAL | 0 refills | Status: DC | PRN

## 2017-05-24 NOTE — Telephone Encounter (Signed)
She just got a b12 on 05/20/17

## 2017-05-24 NOTE — ED Provider Notes (Addendum)
[facility]    CSN: [id number] Arrival date & time: 05/24/17  1135     History   Chief Complaint Chief Complaint  Patient presents with  . Leg Pain    HPI Janet Turner is a 52 y.o. female.   The history is provided by the patient. No language interpreter was used.  Back Pain  Location:  Lumbar spine Quality:  Aching Radiates to:  Does not radiate Pain severity:  Moderate Pain is:  Unable to specify Onset quality:  Gradual Duration:  1 day Timing:  Constant Progression:  Worsening Chronicity:  New Relieved by:  Nothing Worsened by:  Nothing Ineffective treatments:  None tried Pt reports she has restless leg syndrome.  Pt reports difficulty sleeping.  Pt reports she is having low back pain.  Pt thinks it is from her legs moving so much.  Past Medical History:  Diagnosis Date  . Abnormal Pap smear of cervix 2013, 2015   positive HR HPV  . Allergy   . Amenorrhea   . Anxiety   . Chicken pox   . Complication of anesthesia    oxygen level  and blood pressure dropped wtih anterior cervical fusion surgery   . Depression   . Family history of adverse reaction to anesthesia    mother- nausea and vomiting   . Gastritis    chronic, inactive  . Genetic testing 09/06/2016   Ms. Whitner underwent genetic counseling on 08/05/2016 and genetic testing for hereditary cancer syndromes on 08/23/2016. Her results were negative for mutations in all 46 genes analyzed by [facility]'s 46-gene Common Hereditary Cancers Panel. Genes analyzed include: APC, ATM, AXIN2, BARD1, BMPR1A, BRCA1, BRCA2, BRIP1, CDH1, CDKN2A, CHEK2, CTNNA1, DICER1, EPCAM, GREM1, HOXB13, KIT, MEN1, MLH1, MSH2  . GERD (gastroesophageal reflux disease)   . Heart murmur   . Hyperlipemia   . Hypertension   . Hypoglycemia   . Hypopituitarism ([place])   . Hypothyroidism   . Infertility, female   . Leg pain   . PTSD (post-traumatic stress disorder)   . Sleep apnea    no cpap     Patient Active  Problem List   Diagnosis Date Noted  . Genetic testing 09/06/2016  . Intractable migraine without aura and with status migrainosus 02/24/2016  . Benign paroxysmal positional vertigo 12/01/2015  . Primary snoring 10/21/2015  . Hypersomnia with sleep apnea 09/03/2015  . Snoring 09/03/2015  . Cataplexy 09/03/2015  . Right knee pain 07/22/2015  . Right arm weakness 02/25/2015  . B12 deficiency 11/19/2014  . Restless leg 11/19/2014  . Status post laparoscopic sleeve gastrectomy+HH repair Jan 2016 05/30/2014  . Morbid obesity ([place]) 05/28/2014  . Undiagnosed cardiac murmurs 03/21/2014  . Gastroesophageal reflux disease without esophagitis 03/19/2014  . Bipolar 1 disorder, mixed, moderate ([place]) 01/18/2014  . Hyponatremia 11/27/2013  . History of bipolar disorder 11/27/2013  . Encephalopathy 11/27/2013  . HTN (hypertension) 07/23/2013  . Hyperlipidemia LDL goal <100 07/23/2013  . Hypothyroidism 07/23/2013  . PTSD (post-traumatic stress disorder) 07/23/2013  . 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 Obesity (BMI 30-39.9) 07/23/2013  . Ganglion cyst 07/23/2013    Past Surgical History:  Procedure Laterality Date  . ANTERIOR CERVICAL DISCECTOMY  2013   Ant Cervical Fusion C4-5 with diskectomy  . BREAST REDUCTION SURGERY  96  . CESAREAN SECTION  91  . CHOLECYSTECTOMY  97  . EYE SURGERY  [phone]   x's 3   . ganglion cyst removed    . LAPAROSCOPIC GASTRIC SLEEVE  RESECTION N/A 05/28/2014   Procedure: LAPAROSCOPIC GASTRIC SLEEVE RESECTION;  Surgeon: Pedro Earls, MD;  Location: [facility];  Service: General;  Laterality: N/A;  . STRABISMUS SURGERY Bilateral 12/24/2016   Procedure: BILATERAL STRABISMUS REPAIR;  Surgeon: Everitt Amber, MD;  Location: [facility];  Service: Ophthalmology;  Laterality: Bilateral;  . UPPER GASTROINTESTINAL ENDOSCOPY    . UPPER GI ENDOSCOPY N/A 05/28/2014   Procedure: UPPER GI ENDOSCOPY;  Surgeon: Pedro Earls, MD;  Location: [facility];   Service: General;  Laterality: N/A;    OB History    Gravida Para Term Preterm AB Living   '6 1 1 ' 0 5 1   SAB TAB Ectopic Multiple Live Births   5 0 0 0 1      Obstetric Comments   1 adopted daughter       Home Medications    Prior to Admission medications   Medication Sig Start Date End Date Taking? Authorizing Provider  atorvastatin (LIPITOR) 20 MG tablet Take 1 tablet (20 mg total) by mouth daily. 05/04/17   Roma Schanz R, DO  cyanocobalamin (,VITAMIN B-12,) 1000 MCG/ML injection 1 ml SQ qmonth 05/20/17   Carollee Herter, Yvonne R, DO  ferrous sulfate 220 (44 Fe) MG/5ML solution Take 5 mLs (220 mg total) by mouth daily. 05/22/17   Ann Held, DO  HYDROcodone-acetaminophen (NORCO/VICODIN) 5-325 MG tablet Take 1-2 tablets by mouth every 6 (six) hours as needed. Patient not taking: Reported on 05/20/2017 04/10/17   Davonna Belling, MD  lamoTRIgine (LAMICTAL) 150 MG tablet Take 300 mg by mouth every morning.     [provider]  levofloxacin (LEVAQUIN) 500 MG tablet Take 1 tablet (500 mg total) by mouth daily. Patient not taking: Reported on 05/20/2017 04/10/17   Davonna Belling, MD  levothyroxine (SYNTHROID, LEVOTHROID) 75 MCG tablet TAKE 1 TABLET (75 MCG TOTAL) BY MOUTH DAILY. 10/26/16   Ann Held, DO  lisinopril (PRINIVIL,ZESTRIL) 10 MG tablet Take 1 tablet (10 mg total) by mouth daily. 10/26/16   Ann Held, DO  pantoprazole (PROTONIX) 40 MG tablet Take 1 tablet (40 mg total) by mouth daily. 05/04/17   Ann Held, DO  QUEtiapine (SEROQUEL) 200 MG tablet Take 400 mg by mouth at bedtime.  09/22/16   [provider]  rOPINIRole (REQUIP) 0.5 MG tablet Take 1/2 tablet every evening for 2 days, then increase to 1 tablet every evening 04/05/17   Carollee Herter, Alferd Apa, DO  sertraline (ZOLOFT) 100 MG tablet Take 150 mg by mouth every morning.  12/18/13   Ann Held, DO    Family History Family History  Problem  Relation Age of Onset  . Osteoarthritis Mother   . Breast cancer Mother 18       treated with bilateral mastectomies, chemo, radiation  . Other Mother 74       reported to have positive genetic testing  . Factor V Leiden deficiency Mother   . Hypertension Father   . Melanoma Father   . Depression Father        PTSD  . Cancer Father 18       melanoma; recurred and metastasized at 21  . Alcoholism Unknown   . Stroke Unknown        Paternal Family--9/12 children after age 15  . Anuerysm Unknown        Paternal family  . Hypertension Unknown        Paternal family  .  Ovarian cancer Maternal Grandmother 109       treated with surgery and chemo  . Lung cancer Maternal Grandmother 73       h/o smoking  . Breast cancer Maternal Grandmother 50       mastectomy.recurence age 8 on the other, mastectomy.treated with chemo and RX  . Colon polyps Maternal Grandmother        polyposis since middle-age  . Other Maternal Grandmother 50       abdominal desmoid tumor-no surgery due to blood vessel involvement  . Prostate cancer Paternal Grandfather 60  . Factor V Leiden deficiency Sister   . Aneurysm Paternal Aunt 61       brain  . Aneurysm Maternal Grandfather 69       abdominal and cause of death  . Aneurysm Paternal Grandmother 33       brain  . Colon cancer Neg Hx   . Esophageal cancer Neg Hx   . Rectal cancer Neg Hx   . Stomach cancer Neg Hx     Social History Social History   Tobacco Use  . Smoking status: Never Smoker  . Smokeless tobacco: Never Used  Substance Use Topics  . Alcohol use: No    Alcohol/week: 0.0 oz  . Drug use: No     Allergies   Abilify [aripiprazole] and Penicillins   Review of Systems Review of Systems  Musculoskeletal: Positive for back pain.  All other systems reviewed and are negative.    Physical Exam Triage Vital Signs ED Triage Vitals  Enc Vitals Group     BP 05/24/17 1202 (!) 148/90     Pulse Rate 05/24/17 1202 78     Resp --       Temp 05/24/17 1202 98.4 F (36.9 C)     Temp Source 05/24/17 1202 Oral     SpO2 05/24/17 1202 100 %     Weight 05/24/17 1203 154 lb (69.9 kg)     Height 05/24/17 1203 '4\' 9"'  (1.448 m)     Head Circumference --      Peak Flow --      Pain Score 05/24/17 1203 10     Pain Loc --      Pain Edu? --      Excl. in [place]? --    No data found.  Updated Vital Signs BP (!) 148/90 (BP Location: Right Arm)   Pulse 78   Temp 98.4 F (36.9 C) (Oral)   Ht '4\' 9"'  (1.448 m)   Wt 154 lb (69.9 kg)   LMP 01/15/2000   SpO2 100%   BMI 33.33 kg/m   Visual Acuity Right Eye Distance:   Left Eye Distance:   Bilateral Distance:    Right Eye Near:   Left Eye Near:    Bilateral Near:     Physical Exam  Constitutional: She appears well-developed and well-nourished.  HENT:  Head: Normocephalic.  Eyes: Pupils are equal, round, and reactive to light.  Cardiovascular: Normal rate.  Pulmonary/Chest: Effort normal.  Abdominal: Soft.  Musculoskeletal: Normal range of motion.  Tender ls spine to palpation  Neurological: She is alert.  Skin: Skin is warm.  Psychiatric: She has a normal mood and affect.  Nursing note and vitals reviewed.    UC Treatments / Results  Labs (all labs ordered are listed, but only abnormal results are displayed) Labs Reviewed - No data to display  EKG  EKG Interpretation None       Radiology Dg  Lumbar Spine Complete  Result Date: 05/24/2017 CLINICAL DATA:  Mid and lower back pain radiating into both legs for the past several weeks. No known injury. EXAM: LUMBAR SPINE - COMPLETE 4+ VIEW COMPARISON:  Coronal and sagittal CT images from an abdominal CT scan of April 10, 2017 FINDINGS: The lumbar vertebral bodies are preserved in height. The pedicles and transverse processes are intact. The disc space heights are well maintained with exception of L1-L2 where there is mild narrowing. There is no spondylolisthesis. There is no significant facet joint hypertrophy.  There is mild degenerative disc space narrowing in the lower thoracic spine. IMPRESSION: Mild degenerative disc changes in the lower thoracic and upper lumbar spine. No compression fracture or spondylolisthesis. Electronically Signed   By: David  Martinique M.D.   On: 05/24/2017 12:32    Procedures Procedures (including critical care time)  Medications Ordered in UC Medications - No data to display   Initial Impression / Assessment and Plan / UC Course  I have reviewed the triage vital signs and the nursing notes.  Pertinent labs & imaging results that were available during my care of the patient were reviewed by me and considered in my medical decision making (see chart for details).   Pt advised to follow up her primary.  Pt given limited amount of pain medication and valium to help spasm.   Meds ordered this encounter  Medications  . HYDROcodone-acetaminophen (NORCO/VICODIN) 5-325 MG tablet    Sig: Take 2 tablets by mouth every 4 (four) hours as needed.    Dispense:  10 tablet    Refill:  0    Order Specific Question:   Supervising Provider    Answer:   Burnett Harry, DAVID [[id number]]  . diazepam (VALIUM) 5 MG tablet    Sig: Take 1 tablet (5 mg total) by mouth 2 (two) times daily.    Dispense:  10 tablet    Refill:  0    Order Specific Question:   Supervising Provider    Answer:   Burnett Harry, DAVID [[id number]]    Final Clinical Impressions(s) / UC Diagnoses   Final diagnoses:  Acute low back pain without sciatica, unspecified back pain laterality    ED Discharge Orders        Ordered    HYDROcodone-acetaminophen (NORCO/VICODIN) 5-325 MG tablet  Every 4 hours PRN     05/24/17 1247    diazepam (VALIUM) 5 MG tablet  2 times daily     05/24/17 1247      An After Visit Summary was printed and given to the patient.  Controlled Substance Prescriptions [place] Controlled Substance Registry consulted? Yes, I have consulted the [place] Controlled Substances Registry for this patient, and feel the  risk/benefit ratio today is favorable for proceeding with this prescription for a controlled substance.   Fransico Meadow, PA-C 05/24/17 1248    Fransico Meadow, [place] 05/24/17 1553

## 2017-05-24 NOTE — Telephone Encounter (Deleted)
Yes she just got one on 1

## 2017-05-24 NOTE — Telephone Encounter (Signed)
  Reason for Disposition . Leg pain or muscle cramp is a chronic symptom (recurrent or ongoing AND present > 4 weeks)  Answer Assessment - Initial Assessment Questions 1. ONSET: "When did the pain start?"      Started after going to bed 2. LOCATION: "Where is the pain located?"      Both legs - has restless leg 3. PAIN: "How bad is the pain?"    (Scale 1-10; or mild, moderate, severe)   -  MILD (1-3): doesn't interfere with normal activities    -  MODERATE (4-7): interferes with normal activities (e.g., work or school) or awakens from sleep, limping    -  SEVERE (8-10): excruciating pain, unable to do any normal activities, unable to walk     25 4. WORK OR EXERCISE: "Has there been any recent work or exercise that involved this part of the body?"      No 5. CAUSE: "What do you think is causing the leg pain?"     Restless leg 6. OTHER SYMPTOMS: "Do you have any other symptoms?" (e.g., chest pain, back pain, breathing difficulty, swelling, rash, fever, numbness, weakness)     Just the legs cramping. Legs feel heavy this morning 7. PREGNANCY: "Is there any chance you are pregnant?" "When was your last menstrual period?"     No  Protocols used: LEG PAIN-A-AH Pt. States she has restless leg syndrome. States last night "was the worst night ever. I almost went to the ED the pain was so bad."

## 2017-05-24 NOTE — ED Triage Notes (Signed)
Restless leg syndrome and low back pain. Was seen Friday by PCP who increased Requip, but her pain has increased starting yesterday. 10/10

## 2017-05-24 NOTE — Discharge Instructions (Signed)
Return if any problems.  See Dr. Laury AxonLowne for recheck.

## 2017-05-24 NOTE — Telephone Encounter (Signed)
She can just start decreasing the dose back down and stop it--- we can refer to neuro for more help Is she taking the b12 shots?

## 2017-05-24 NOTE — Telephone Encounter (Signed)
Called Pt and she states she's having leg pain and would like to be seen today however there are no openings on the schedule. Advised Pt to go to urgent care or local ER and she stated compliance.

## 2017-05-26 ENCOUNTER — Telehealth: Payer: Self-pay

## 2017-05-26 NOTE — Telephone Encounter (Signed)
Feeling better, has followed up with PCP.

## 2017-05-26 NOTE — Telephone Encounter (Signed)
I thought I already answered this----  Was neuro referral done?--- hopefully they can see her soon  If meds are helping from UC--- f/u here and we can discuss further

## 2017-05-26 NOTE — Telephone Encounter (Signed)
Neurology or neurosurgery?

## 2017-05-26 NOTE — Telephone Encounter (Signed)
neurology

## 2017-05-27 ENCOUNTER — Other Ambulatory Visit: Payer: Self-pay | Admitting: Family Medicine

## 2017-05-27 ENCOUNTER — Encounter: Payer: Self-pay | Admitting: Family Medicine

## 2017-05-27 DIAGNOSIS — G2581 Restless legs syndrome: Secondary | ICD-10-CM

## 2017-05-27 MED ORDER — HYDROCODONE-ACETAMINOPHEN 5-325 MG PO TABS: 2.0000 | ORAL_TABLET | ORAL | 0 refills | Status: DC | PRN

## 2017-05-27 MED FILL — HYDROCODON-APAP 5-325: 5-325 | 1 days supply | Qty: 10 | Fill #0

## 2017-06-03 ENCOUNTER — Ambulatory Visit (INDEPENDENT_AMBULATORY_CARE_PROVIDER_SITE_OTHER): Payer: 59 | Admitting: Family Medicine

## 2017-06-03 ENCOUNTER — Encounter: Payer: Self-pay | Admitting: Family Medicine

## 2017-06-03 VITALS — BP 120/70 | HR 53 | Temp 97.8°F | Resp 16 | Ht <= 58 in | Wt 153.8 lb

## 2017-06-03 DIAGNOSIS — R300 Dysuria: Secondary | ICD-10-CM | POA: Diagnosis not present

## 2017-06-03 DIAGNOSIS — Z79899 Other long term (current) drug therapy: Secondary | ICD-10-CM

## 2017-06-03 DIAGNOSIS — G2581 Restless legs syndrome: Secondary | ICD-10-CM | POA: Diagnosis not present

## 2017-06-03 LAB — POC URINALSYSI DIPSTICK (AUTOMATED)
BILIRUBIN UA: NEGATIVE
Blood, UA: NEGATIVE
GLUCOSE UA: NEGATIVE
Ketones, UA: NEGATIVE
LEUKOCYTES UA: NEGATIVE
Nitrite, UA: NEGATIVE
Protein, UA: NEGATIVE
SPEC GRAV UA: 1.01 (ref 1.010–1.025)
Urobilinogen, UA: 0.2 E.U./dL
pH, UA: 6 (ref 5.0–8.0)

## 2017-06-03 MED ORDER — PREGABALIN 75 MG PO CAPS: 75.0000 mg | ORAL_CAPSULE | Freq: Every day | ORAL | 2 refills | Status: DC

## 2017-06-03 MED ORDER — CIPROFLOXACIN HCL 250 MG PO TABS: 250.0000 mg | ORAL_TABLET | Freq: Two times a day (BID) | ORAL | 0 refills | Status: DC

## 2017-06-03 MED ORDER — HYDROCODONE-ACETAMINOPHEN 5-325 MG PO TABS: ORAL_TABLET | ORAL | 0 refills | Status: DC

## 2017-06-03 NOTE — Patient Instructions (Signed)

## 2017-06-03 NOTE — Progress Notes (Signed)
Patient ID: Janet Turner, female   DOB: 05-10-1966, 52 y.o.   MRN: [id number]     Subjective:  I acted as a Janet Turner for Janet Turner.  Janet Turner, [name]   Patient ID: Janet Turner, female    DOB: [birth year], 52 y.o.   MRN: [id number]  Chief Complaint  Patient presents with  . restless leg    vicodin refill  . Dysuria  . Urinary Frequency    HPI  Patient is in today for follow up restless leg syndrome to get her Vicodin refilled.  She also has some dysuria and frequency x 1-2 days  Pt has tried mirapex and requip --- the mirapex stopped working and the requip never helped.  She is off both ---er gave her vicodin which has helped  Patient Care Team: Janet Turner, Janet Apa, DO as PCP - General (Family Medicine)   Past Medical History:  Diagnosis Date  . Abnormal Pap smear of cervix 2013, 2015   positive HR HPV  . Allergy   . Amenorrhea   . Anxiety   . Chicken pox   . Complication of anesthesia    oxygen level  and blood pressure dropped wtih anterior cervical fusion surgery   . Depression   . Family history of adverse reaction to anesthesia    mother- nausea and vomiting   . Gastritis    chronic, inactive  . Genetic testing 09/06/2016   Ms. Depaul underwent genetic counseling on 08/05/2016 and genetic testing for hereditary cancer syndromes on 08/23/2016. Her results were negative for mutations in all 46 genes analyzed by [facility]'s 46-gene Common Hereditary Cancers Panel. Genes analyzed include: APC, ATM, AXIN2, BARD1, BMPR1A, BRCA1, BRCA2, BRIP1, CDH1, CDKN2A, CHEK2, CTNNA1, DICER1, EPCAM, GREM1, HOXB13, KIT, MEN1, MLH1, MSH2  . GERD (gastroesophageal reflux disease)   . Heart murmur   . Hyperlipemia   . Hypertension   . Hypoglycemia   . Hypopituitarism ([place])   . Hypothyroidism   . Infertility, female   . Leg pain   . PTSD (post-traumatic stress disorder)   . Sleep apnea    no cpap     Past Surgical History:  Procedure Laterality Date  .  ANTERIOR CERVICAL DISCECTOMY  2013   Ant Cervical Fusion C4-5 with diskectomy  . BREAST REDUCTION SURGERY  96  . CESAREAN SECTION  91  . CHOLECYSTECTOMY  97  . EYE SURGERY  [phone]   x's 3   . ganglion cyst removed    . LAPAROSCOPIC GASTRIC SLEEVE RESECTION N/A 05/28/2014   Procedure: LAPAROSCOPIC GASTRIC SLEEVE RESECTION;  Surgeon: Janet Earls, MD;  Location: [facility];  Service: General;  Laterality: N/A;  . STRABISMUS SURGERY Bilateral 12/24/2016   Procedure: BILATERAL STRABISMUS REPAIR;  Surgeon: Everitt Amber, MD;  Location: [facility];  Service: Ophthalmology;  Laterality: Bilateral;  . UPPER GASTROINTESTINAL ENDOSCOPY    . UPPER GI ENDOSCOPY N/A 05/28/2014   Procedure: UPPER GI ENDOSCOPY;  Surgeon: Janet Earls, MD;  Location: [facility];  Service: General;  Laterality: N/A;    Family History  Problem Relation Age of Onset  . Osteoarthritis Mother   . Breast cancer Mother 45       treated with bilateral mastectomies, chemo, radiation  . Other Mother 65       reported to have positive genetic testing  . Factor V Leiden deficiency Mother   . Hypertension Father   . Melanoma Father   . Depression Father  PTSD  . Cancer Father 73       melanoma; recurred and metastasized at 46  . Alcoholism Unknown   . Stroke Unknown        Paternal Family--9/12 children after age 35  . Anuerysm Unknown        Paternal family  . Hypertension Unknown        Paternal family  . Ovarian cancer Maternal Grandmother 31       treated with surgery and chemo  . Lung cancer Maternal Grandmother 73       h/o smoking  . Breast cancer Maternal Grandmother 50       mastectomy.recurence age 72 on the other, mastectomy.treated with chemo and RX  . Colon polyps Maternal Grandmother        polyposis since middle-age  . Other Maternal Grandmother 50       abdominal desmoid tumor-no surgery due to blood vessel involvement  . Prostate cancer Paternal Grandfather 80  . Factor V  Leiden deficiency Sister   . Aneurysm Paternal Aunt 38       brain  . Aneurysm Maternal Grandfather 69       abdominal and cause of death  . Aneurysm Paternal Grandmother 63       brain  . Colon cancer Neg Hx   . Esophageal cancer Neg Hx   . Rectal cancer Neg Hx   . Stomach cancer Neg Hx     Social History   Socioeconomic History  . Marital status: Married    Spouse name: Not on file  . Number of children: Not on file  . Years of Janet: Not on file  . Highest Janet level: Not on file  Social Needs  . Financial resource strain: Not on file  . Food insecurity - worry: Not on file  . Food insecurity - inability: Not on file  . Transportation needs - medical: Not on file  . Transportation needs - non-medical: Not on file  Occupational History  . Not on file  Tobacco Use  . Smoking status: Never Smoker  . Smokeless tobacco: Never Used  Substance and Sexual Activity  . Alcohol use: No    Alcohol/week: 0.0 oz  . Drug use: No  . Sexual activity: Yes    Partners: Male    Birth control/protection: Post-menopausal  Other Topics Concern  . Not on file  Social History Narrative  . Not on file    Outpatient Medications Prior to Visit  Medication Sig Dispense Refill  . atorvastatin (LIPITOR) 20 MG tablet Take 1 tablet (20 mg total) by mouth daily. 180 tablet 0  . cyanocobalamin (,VITAMIN B-12,) 1000 MCG/ML injection 1 ml SQ qmonth 1 mL 5  . diazepam (VALIUM) 5 MG tablet Take 1 tablet (5 mg total) by mouth 2 (two) times daily. 10 tablet 0  . ferrous sulfate 220 (44 Fe) MG/5ML solution Take 5 mLs (220 mg total) by mouth daily. 150 mL 3  . lamoTRIgine (LAMICTAL) 150 MG tablet Take 300 mg by mouth every morning.     Marland Kitchen levofloxacin (LEVAQUIN) 500 MG tablet Take 1 tablet (500 mg total) by mouth daily. 7 tablet 0  . levothyroxine (SYNTHROID, LEVOTHROID) 75 MCG tablet TAKE 1 TABLET (75 MCG TOTAL) BY MOUTH DAILY. 90 tablet 1  . lisinopril (PRINIVIL,ZESTRIL) 10 MG tablet Take 1  tablet (10 mg total) by mouth daily. 90 tablet 3  . pantoprazole (PROTONIX) 40 MG tablet Take 1 tablet (40 mg total) by mouth daily.  90 tablet 0  . QUEtiapine (SEROQUEL) 200 MG tablet Take 400 mg by mouth at bedtime.   2  . rOPINIRole (REQUIP) 0.5 MG tablet Take 1/2 tablet every evening for 2 days, then increase to 1 tablet every evening 90 tablet 0  . sertraline (ZOLOFT) 100 MG tablet Take 150 mg by mouth every morning.     Marland Kitchen HYDROcodone-acetaminophen (NORCO/VICODIN) 5-325 MG tablet Take 2 tablets by mouth every 4 (four) hours as needed. 10 tablet 0   No facility-administered medications prior to visit.     Allergies  Allergen Reactions  . Abilify [Aripiprazole] Other (See Comments)    Caused dyskinesia (patient reported)  . Penicillins Hives and Itching    Review of Systems  Constitutional: Negative for fever and malaise/fatigue.  HENT: Negative for congestion.   Eyes: Negative for blurred vision.  Respiratory: Negative for cough and shortness of breath.   Cardiovascular: Negative for chest pain, palpitations and leg swelling.  Gastrointestinal: Negative for vomiting.  Genitourinary: Positive for dysuria and frequency.  Musculoskeletal: Negative for back pain.  Skin: Negative for rash.  Neurological: Negative for loss of consciousness and headaches.       Objective:    Physical Exam  Constitutional: She is oriented to person, place, and time. She appears well-developed and well-nourished.  HENT:  Head: Normocephalic and atraumatic.  Eyes: Conjunctivae and EOM are normal.  Neck: Normal range of motion. Neck supple. No JVD present. Carotid bruit is not present. No thyromegaly present.  Cardiovascular: Normal rate, regular rhythm and normal heart sounds.  No murmur heard. Pulmonary/Chest: Effort normal and breath sounds normal. No respiratory distress. She has no wheezes. She has no rales. She exhibits no tenderness.  Abdominal: Soft. She exhibits no distension. There is no  tenderness. There is no rebound.  Musculoskeletal: She exhibits no edema.  Neurological: She is alert and oriented to person, place, and time.  Psychiatric: She has a normal mood and affect.  Nursing note and vitals reviewed.   BP 120/70 (BP Location: Right Arm, Cuff Size: Normal)   Pulse (!) 53   Temp 97.8 F (36.6 C) (Oral)   Resp 16   Ht '4\' 9"'  (1.448 m)   Wt 153 lb 12.8 oz (69.8 kg)   LMP 01/15/2000   SpO2 98%   BMI 33.28 kg/m  Wt Readings from Last 3 Encounters:  06/03/17 153 lb 12.8 oz (69.8 kg)  05/24/17 154 lb (69.9 kg)  05/20/17 157 lb 9.6 oz (71.5 kg)   BP Readings from Last 3 Encounters:  06/03/17 120/70  05/24/17 (!) 148/90  05/20/17 130/62     Immunization History  Administered Date(s) Administered  . Hepatitis B 05/12/2012, 07/11/2012, 01/22/2013  . Influenza-Unspecified 03/12/2011, 03/10/2012, 01/29/2013, 02/12/2014, 02/08/2015, 02/10/2016  . PPD Test 05/09/2012  . Tdap 07/20/2010, 05/11/2011, 05/09/2012, 11/25/2013    Health Maintenance  Topic Date Due  . COLONOSCOPY  11/15/2014  . MAMMOGRAM  07/06/2017  . PAP SMEAR  06/30/2019  . TETANUS/TDAP  11/26/2023  . INFLUENZA VACCINE  Completed  . HIV Screening  Completed    Lab Results  Component Value Date   WBC 10.0 05/20/2017   HGB 11.2 (L) 05/20/2017   HCT 33.4 (L) 05/20/2017   PLT 256 05/20/2017   GLUCOSE 91 04/10/2017   CHOL 151 08/05/2016   TRIG 84 08/05/2016   HDL 102 08/05/2016   LDLCALC 32 08/05/2016   ALT 26 04/10/2017   AST 28 04/10/2017   NA 136 04/10/2017   K 3.9  04/10/2017   CL 103 04/10/2017   CREATININE 1.11 (H) 04/10/2017   BUN 19 04/10/2017   CO2 26 04/10/2017   TSH 0.61 08/05/2016   HGBA1C 5.4 09/19/2014   MICROALBUR <0.7 09/19/2014    Lab Results  Component Value Date   TSH 0.61 08/05/2016   Lab Results  Component Value Date   WBC 10.0 05/20/2017   HGB 11.2 (L) 05/20/2017   HCT 33.4 (L) 05/20/2017   MCV 88.6 05/20/2017   PLT 256 05/20/2017   Lab Results    Component Value Date   NA 136 04/10/2017   K 3.9 04/10/2017   CO2 26 04/10/2017   GLUCOSE 91 04/10/2017   BUN 19 04/10/2017   CREATININE 1.11 (H) 04/10/2017   BILITOT 0.5 04/10/2017   ALKPHOS 116 04/10/2017   AST 28 04/10/2017   ALT 26 04/10/2017   PROT 7.2 04/10/2017   ALBUMIN 3.7 04/10/2017   CALCIUM 9.1 04/10/2017   ANIONGAP 7 04/10/2017   GFR 69.16 04/13/2016   Lab Results  Component Value Date   CHOL 151 08/05/2016   Lab Results  Component Value Date   HDL 102 08/05/2016   Lab Results  Component Value Date   LDLCALC 32 08/05/2016   Lab Results  Component Value Date   TRIG 84 08/05/2016   Lab Results  Component Value Date   CHOLHDL 1.5 08/05/2016   Lab Results  Component Value Date   HGBA1C 5.4 09/19/2014         Assessment & Plan:   Problem List Items Addressed This Visit    None    Visit Diagnoses    High risk medication use    -  Primary   Relevant Orders   Pain Mgmt, Profile 8 w/Conf, U   Dysuria       Relevant Medications   ciprofloxacin (CIPRO) 250 MG tablet   Other Relevant Orders   POCT Urinalysis Dipstick (Automated)   Urine Culture   RLS (restless legs syndrome)       Relevant Medications   HYDROcodone-acetaminophen (NORCO/VICODIN) 5-325 MG tablet   pregabalin (LYRICA) 75 MG capsule      I have changed Dorthula Nettles. Dziedzic's HYDROcodone-acetaminophen. I am also having her start on pregabalin and ciprofloxacin. Additionally, I am having her maintain her sertraline, lamoTRIgine, QUEtiapine, lisinopril, levothyroxine, rOPINIRole, levofloxacin, pantoprazole, atorvastatin, cyanocobalamin, ferrous sulfate, and diazepam.  Meds ordered this encounter  Medications  . HYDROcodone-acetaminophen (NORCO/VICODIN) 5-325 MG tablet    Sig: 2 po qhs prn restless legs    Dispense:  60 tablet    Refill:  0  . pregabalin (LYRICA) 75 MG capsule    Sig: Take 1 capsule (75 mg total) by mouth daily. 1 hour before bedtime    Dispense:  30 capsule     Refill:  2  . ciprofloxacin (CIPRO) 250 MG tablet    Sig: Take 1 tablet (250 mg total) by mouth 2 (two) times daily.    Dispense:  6 tablet    Refill:  0    CMA served as scribe during this visit. History, Physical and Plan performed by medical provider. Documentation and orders reviewed and attested to.  Ann Held, DO

## 2017-06-06 ENCOUNTER — Encounter: Payer: Self-pay | Admitting: Family Medicine

## 2017-06-06 LAB — URINE CULTURE
MICRO NUMBER: 90108968
SPECIMEN QUALITY:: ADEQUATE

## 2017-06-07 ENCOUNTER — Other Ambulatory Visit: Payer: Self-pay

## 2017-06-07 DIAGNOSIS — N390 Urinary tract infection, site not specified: Secondary | ICD-10-CM

## 2017-06-07 MED ORDER — NITROFURANTOIN MONOHYD MACRO 100 MG PO CAPS: 100.0000 mg | ORAL_CAPSULE | Freq: Two times a day (BID) | ORAL | 0 refills | Status: DC

## 2017-06-08 LAB — PAIN MGMT, PROFILE 8 W/CONF, U
6 Acetylmorphine: NEGATIVE ng/mL (ref <10)
ALCOHOL METABOLITES: NEGATIVE ng/mL (ref <500)
ALPHAHYDROXYMIDAZOLAM: NEGATIVE ng/mL (ref <50)
Alphahydroxyalprazolam: NEGATIVE ng/mL (ref <25)
Alphahydroxytriazolam: NEGATIVE ng/mL (ref <50)
Aminoclonazepam: NEGATIVE ng/mL (ref <25)
Amphetamines: NEGATIVE ng/mL (ref <500)
BUPRENORPHINE, URINE: NEGATIVE ng/mL (ref <5)
Benzodiazepines: NEGATIVE ng/mL (ref <100)
COCAINE METABOLITE: NEGATIVE ng/mL (ref <150)
Codeine: NEGATIVE ng/mL (ref <50)
Creatinine: 70.1 mg/dL
HYDROCODONE: 290 ng/mL — AB (ref <50)
HYDROXYETHYLFLURAZEPAM: NEGATIVE ng/mL (ref <50)
Hydromorphone: 103 ng/mL — ABNORMAL HIGH (ref <50)
Lorazepam: NEGATIVE ng/mL (ref <50)
MARIJUANA METABOLITE: NEGATIVE ng/mL (ref <20)
MDMA: NEGATIVE ng/mL (ref <500)
MORPHINE: NEGATIVE ng/mL (ref <50)
NORDIAZEPAM: NEGATIVE ng/mL (ref <50)
Norhydrocodone: 1792 ng/mL — ABNORMAL HIGH (ref <50)
OPIATES: POSITIVE ng/mL — AB (ref <100)
Oxazepam: NEGATIVE ng/mL (ref <50)
Oxidant: NEGATIVE ug/mL (ref <200)
Oxycodone: NEGATIVE ng/mL (ref <100)
TEMAZEPAM: NEGATIVE ng/mL (ref <50)
pH: 5.94 (ref 4.5–9.0)

## 2017-06-16 ENCOUNTER — Telehealth: Payer: Self-pay | Admitting: *Deleted

## 2017-06-16 NOTE — Telephone Encounter (Signed)
Received request for Medical records from [place] [employer], forwarded to Jordan for email/scan/SLS 02/07    

## 2017-06-21 ENCOUNTER — Ambulatory Visit: Payer: Self-pay | Admitting: Family Medicine

## 2017-06-27 ENCOUNTER — Other Ambulatory Visit (INDEPENDENT_AMBULATORY_CARE_PROVIDER_SITE_OTHER): Payer: BLUE CROSS/BLUE SHIELD

## 2017-06-27 ENCOUNTER — Other Ambulatory Visit: Payer: Self-pay | Admitting: Family Medicine

## 2017-06-27 ENCOUNTER — Telehealth: Payer: Self-pay | Admitting: Family Medicine

## 2017-06-27 ENCOUNTER — Other Ambulatory Visit: Payer: Self-pay

## 2017-06-27 DIAGNOSIS — N390 Urinary tract infection, site not specified: Secondary | ICD-10-CM | POA: Diagnosis not present

## 2017-06-27 DIAGNOSIS — R82998 Other abnormal findings in urine: Secondary | ICD-10-CM | POA: Diagnosis not present

## 2017-06-27 DIAGNOSIS — G2581 Restless legs syndrome: Secondary | ICD-10-CM

## 2017-06-27 LAB — POC URINALSYSI DIPSTICK (AUTOMATED)
Bilirubin, UA: NEGATIVE
Blood, UA: NEGATIVE
GLUCOSE UA: NEGATIVE
KETONES UA: NEGATIVE
Nitrite, UA: NEGATIVE
Protein, UA: NEGATIVE
SPEC GRAV UA: 1.025 (ref 1.010–1.025)
Urobilinogen, UA: 0.2 E.U./dL
pH, UA: 6 (ref 5.0–8.0)

## 2017-06-27 MED ORDER — HYDROCODONE-ACETAMINOPHEN 5-325 MG PO TABS: ORAL_TABLET | ORAL | 0 refills | Status: DC

## 2017-06-27 NOTE — Telephone Encounter (Signed)
Patient requesting [facility] [place]  Database ran and is on your desk for review.  Last filled per database: 06/03/17 Last written:   06/03/17  Last ov: 06/03/17 Next ov: 07/26/17 Contract: 06/03/18 UDS: 12/01/17

## 2017-06-27 NOTE — Telephone Encounter (Signed)
Copied from CRM [phone]. Topic: Quick Communication - See Telephone Encounter >> Jun 27, 2017 11:15 AM Valentina LucksMatos, Jackelin wrote: CRM for notification. See Telephone encounter for:  06/27/17.   Pt came in office stating that in less than a wk pt is needing Refill on HYDROcodone-acetaminophen (NORCO/VICODIN) 5-325 MG tablets. Pt also wanted to inform that she would like the [facility] [place], [facility] [place] but now [facility] [place]. Pt would like to have her pharmacy update also on [facility] for [facility] [place]. Please advise.

## 2017-06-27 NOTE — Addendum Note (Signed)
Addended by: Verdie ShireBAYNES, Heron Pitcock M on: 06/27/2017 12:10 PM   Modules accepted: Orders

## 2017-06-27 NOTE — Telephone Encounter (Signed)
Sent in

## 2017-06-28 ENCOUNTER — Other Ambulatory Visit: Payer: Self-pay | Admitting: Family Medicine

## 2017-06-28 ENCOUNTER — Encounter: Payer: Self-pay | Admitting: Family Medicine

## 2017-06-28 DIAGNOSIS — Z1231 Encounter for screening mammogram for malignant neoplasm of breast: Secondary | ICD-10-CM

## 2017-06-28 LAB — URINE CULTURE
MICRO NUMBER:: 90214139
SPECIMEN QUALITY:: ADEQUATE

## 2017-06-28 NOTE — Telephone Encounter (Signed)
Patient notified

## 2017-06-29 ENCOUNTER — Encounter: Payer: Self-pay | Admitting: Gastroenterology

## 2017-06-29 ENCOUNTER — Telehealth: Payer: Self-pay

## 2017-06-29 ENCOUNTER — Encounter: Payer: Self-pay | Admitting: Family Medicine

## 2017-06-29 DIAGNOSIS — K219 Gastro-esophageal reflux disease without esophagitis: Secondary | ICD-10-CM

## 2017-06-29 MED ORDER — PANTOPRAZOLE SODIUM 40 MG PO TBEC: 40.0000 mg | DELAYED_RELEASE_TABLET | Freq: Two times a day (BID) | ORAL | 1 refills | Status: AC

## 2017-06-29 NOTE — Telephone Encounter (Signed)
PA initiated via Covermymeds; KEY: [id number]. Received real-time PA approval. Effective from 06/29/2017 through 07/28/2017.

## 2017-06-29 NOTE — Telephone Encounter (Signed)
Take the protonix bid and refer to GI

## 2017-07-01 ENCOUNTER — Telehealth: Payer: Self-pay | Admitting: *Deleted

## 2017-07-01 NOTE — Telephone Encounter (Signed)
Received request for Medical records from [place] [employer], forwarded to Jordan for email/scan/SLS 02/22 

## 2017-07-04 ENCOUNTER — Telehealth: Payer: Self-pay | Admitting: Family Medicine

## 2017-07-04 MED ORDER — LISINOPRIL 10 MG PO TABS: 10.0000 mg | ORAL_TABLET | Freq: Every day | ORAL | 0 refills | Status: DC

## 2017-07-04 MED ORDER — ATORVASTATIN CALCIUM 20 MG PO TABS: 20.0000 mg | ORAL_TABLET | Freq: Every day | ORAL | 0 refills | Status: DC

## 2017-07-04 NOTE — Telephone Encounter (Signed)
Copied from CRM [phone]. Topic: Quick Communication - See Telephone Encounter >> Jul 04, 2017 11:44 AM Windy KalataMichael, Frederik Standley L, NT wrote: CRM for notification. See Telephone encounter for:  07/04/17.  Patient is requesting a refill on atorvastatin and lisinopril.    [facility] [address] - [place], [place] - [address], [address]

## 2017-07-05 ENCOUNTER — Ambulatory Visit: Payer: 59 | Admitting: Nurse Practitioner

## 2017-07-07 ENCOUNTER — Ambulatory Visit (INDEPENDENT_AMBULATORY_CARE_PROVIDER_SITE_OTHER): Payer: BLUE CROSS/BLUE SHIELD

## 2017-07-07 DIAGNOSIS — E538 Deficiency of other specified B group vitamins: Secondary | ICD-10-CM

## 2017-07-07 MED ORDER — CYANOCOBALAMIN 1000 MCG/ML IJ SOLN
1000.0000 ug | Freq: Once | INTRAMUSCULAR | Status: AC
  Administered 2017-07-07: 1000 ug via INTRAMUSCULAR

## 2017-07-07 NOTE — Progress Notes (Signed)
Pre visit review using our clinic review tool, if applicable. No additional management support is needed unless otherwise documented below in the visit note.  Pt here today for B12 injection. 1mL injected into L deltoid. Pt tolerated injection well.   Pt to return in 4 weeks for next B12 shot.

## 2017-07-08 ENCOUNTER — Ambulatory Visit: Payer: Self-pay

## 2017-07-09 DIAGNOSIS — Z881 Allergy status to other antibiotic agents status: Secondary | ICD-10-CM | POA: Diagnosis not present

## 2017-07-09 DIAGNOSIS — R55 Syncope and collapse: Secondary | ICD-10-CM | POA: Diagnosis not present

## 2017-07-09 DIAGNOSIS — R296 Repeated falls: Secondary | ICD-10-CM | POA: Diagnosis not present

## 2017-07-09 DIAGNOSIS — Z880 Allergy status to penicillin: Secondary | ICD-10-CM | POA: Diagnosis not present

## 2017-07-09 DIAGNOSIS — R51 Headache: Secondary | ICD-10-CM | POA: Diagnosis not present

## 2017-07-09 DIAGNOSIS — R42 Dizziness and giddiness: Secondary | ICD-10-CM | POA: Diagnosis not present

## 2017-07-09 DIAGNOSIS — Z888 Allergy status to other drugs, medicaments and biological substances status: Secondary | ICD-10-CM | POA: Diagnosis not present

## 2017-07-09 DIAGNOSIS — Z79899 Other long term (current) drug therapy: Secondary | ICD-10-CM | POA: Diagnosis not present

## 2017-07-09 DIAGNOSIS — R531 Weakness: Secondary | ICD-10-CM | POA: Diagnosis not present

## 2017-07-09 MED ORDER — SODIUM CHLORIDE 0.9 % IV SOLN
INTRAVENOUS | Status: DC
Start: ? — End: 2017-07-09

## 2017-07-09 MED ORDER — GENERIC EXTERNAL MEDICATION
Status: DC
Start: ? — End: 2017-07-09

## 2017-07-13 ENCOUNTER — Telehealth: Payer: Self-pay | Admitting: *Deleted

## 2017-07-13 ENCOUNTER — Encounter: Payer: Self-pay | Admitting: Neurology

## 2017-07-13 ENCOUNTER — Ambulatory Visit (INDEPENDENT_AMBULATORY_CARE_PROVIDER_SITE_OTHER): Payer: BLUE CROSS/BLUE SHIELD | Admitting: Neurology

## 2017-07-13 VITALS — BP 146/83 | HR 60 | Ht <= 58 in | Wt 153.0 lb

## 2017-07-13 DIAGNOSIS — G2581 Restless legs syndrome: Secondary | ICD-10-CM

## 2017-07-13 DIAGNOSIS — R45851 Suicidal ideations: Secondary | ICD-10-CM | POA: Diagnosis not present

## 2017-07-13 DIAGNOSIS — F3181 Bipolar II disorder: Secondary | ICD-10-CM | POA: Diagnosis not present

## 2017-07-13 DIAGNOSIS — F422 Mixed obsessional thoughts and acts: Secondary | ICD-10-CM | POA: Diagnosis not present

## 2017-07-13 DIAGNOSIS — D508 Other iron deficiency anemias: Secondary | ICD-10-CM | POA: Diagnosis not present

## 2017-07-13 MED ORDER — ROTIGOTINE 1 MG/24HR TD PT24: MEDICATED_PATCH | TRANSDERMAL | 1 refills | Status: DC

## 2017-07-13 NOTE — Telephone Encounter (Signed)
Received request for Medical records from [place] [employer], forwarded to [place] for email/scan/SLS 03/06    

## 2017-07-13 NOTE — Progress Notes (Signed)
SLEEP MEDICINE CLINIC   Provider:  Larey Seat, M D  Referring Provider: Carollee Herter, Alferd Apa, * Primary Care Physician:  Carollee Herter, Alferd Apa, DO  Chief Complaint  Patient presents with  . New Patient (Initial Visit)    pt alone, rm 10. pt states that she has tried multiple things to help with RLS. she has been taking hydrocodone that she got from urgent care that is the only thing that has helped.     HPI:  Janet Turner is a 52 y.o. female , seen here as a referral  from Dr. Carollee Herter ,  Interval history from 26 July 2017, Dr. Etter Sjogren referred I will mutual patient Janet Turner back to neurology today to evaluate her for restless legs.  She had been on Mirapex in the past later switched to Requip after accelerating doses did no longer control restless legs.  She now feels that the Requip is no longer working either.  Dr. Laverta Baltimore had tested her for iron deficiency which was confirmed may be related to her bariatric surgery history.  She is now on a liquid form of iron and vitamin B12 supplements.  She has not noted that the iron or B12 supplements have helped her to control restless legs any better.  She was so frustrated that one night she had to go to emergency room.  There she was given hydrocodone and Valium and she felt some relief and that this makes also she does understand that both medications are highly addictive.  However she feels that she needs something to sleep and that her life quality is reduced without the ability of getting restful sleep.  Her current medications include Lipitor, Lamictal, levothyroxine, lisinopril, Protonix, Seroquel, Requip, Zoloft, hydrocodone acetaminophen, and Levaquin.  As of January 2019 her hemoglobin was 11.2 low and her hematocrit 33.4 also low.  I did not see a total iron binding capacity of ferritin level but I like for her to have this tested today.  She avoids caffeine and alcohol as she has noticed a correlation with being  more restless.  It is also not good for her depression.  She was advised in good sleep habits "sleep hygiene".  I do wonder if she may be better helped with a patch rather than taking tablets for restless legs.  Mirapex in the beginning allowed 6 hours of sleep, than only 4 and finally she took three doses a day. Mirapex which Failed at the highest dose, 1 mg three times a day, later requip at 0.5 mg and 1 mg did not work. She had OCD and bipolar disorder, and compulsions escalated.  I like to change to the Nupro patch, and avoid Vicodin or Diazepam. In March 2018 she had a nervous beak down, severe mood w swings bipolar and was hospitalized for suicidal risk . I also don't want to give her substances that could be used to commit suicide with,     Interval history from 01/13/2017, Janet Turner has a long-standing history of severe hypersomnia, which could be partially medication induced-  but she was also tested positive for obstructive sleep apnea at a moderate degree in 2014. At the time CPAP seemed to be less attractive to her and she chose a weight loss surgery, assuming that this would treat the apnea.  I am meeting with Janet Turner today, who tried to use CPAP again in 2017 at home but frequently took the mask off in the middle of the night, did  not get satisfying results-she  especially  didn't feel that she slept better. Her husband told her that she still gurgles in her sleep, which has scared her.She is wanting to address alternatives to CPAP or return to CPAP but she has not used the machine for the last 5 months and actually turned in. She was using [facility]. In talking to Janet Turner she still is excessively daytime sleepy, her current Epworth sleepiness score is 13 points, her  fatigue severity is only 16 points, her AHI was under 10. At the time of the PSG SPLIT she endorsed the Epworth score at 17 points and we discussed a narcolepsy work up- she denies cataplexy, dream intrusion,  dream enactment and parasomnia in the interval period . Now , we don't need to address narcolepsy. I would like for her to simply change her sleep habits and avoid sleeping on her back. We are discussing the tennis ball methode today.  She is status post gastric sleeve- weight stable for the last 18 month. BMI is still elevated.    Consult 2017, CD Janet Turner is new patient to our sleep clinic. She has a chief complaint of hypersomnia.  She has a past history of hypertension, hypoglycemia, heart murmur, hypopituitarism, PTSD, hypothyroidism and a menorrhea. She also carries a diagnosis of gastroesophageal reflux disease. She had 3 times undergone eye surgery in 1968, 1970 1982. Her current chief complaint is that of excessive daytime sleepiness, reflected in an Epworth score of 17 points. It turns out that she is snoring, but she has excessive daytime sleepiness but also restless legs, and that she was tested and diagnosed with sleep apnea on 02-12-2013 at [facility]. I "hear from her previous sleep study Epworth sleepiness score was endorsed at 17, insomnia severity index at 10, Becks depression scale at 7 body mass index was named at 39.5 in the meantime the patient is undergone a gastric sleeve surgery and her body mass index has significantly reduced. She was diagnosed with sleep apnea at a total AHI of 22.4 in supine, 52.0 in nonsupine and a total of 25.3. REM AHI was 12.6, total number of obstructive apneas were 25 total number of central apneas 2, hypopneas  92. The same night she was titrated to CPAP beginning at 4 cm water and to a final pressure of 10 cm water. I would like to add that the patient never reached 0 apneas and that the AHI index under 10 cm water was still 14.7 indicated that she did not have a complete night titration non-of the pressures explored was adequate to control all respiratory events. For this reason Dr. Jamse Arn and Dr Albertine Grates ordered an hour to titrated  between 9 and 15 cm water. [name] took theCPAP prescription with her and actually used CPAP a couple of nights 4 weeks but then lost interest. She had started to feel better and have made the decision to undergo a weight loss surgery. For this reason she returned CPAP based on being deemed noncompliant.  On 05/28/2014 she underwent a gastric sleeve procedure and since then has lost 75 pounds. While this habits many positive effects for her overall health she remained as sleepy as before. She is now wondering if she still has sleep apnea. Chief complaint according to patient :I am sleepy .  Sleep habits are as follows:The patient usually goes to bed at 9 PM -with the help of Seroquel  400 mg which helps her to sleep. Her husband  reports her to snore and gasp, gurgle. Her husband is witnessing this. She wakes at 2 AM form the difficulties to breath yet is under the influence of the medication. She gets a snack, she watches TV at night, and sometimes falls asleep with food in her mouth. She is showing other risky behavior with micowaving food at night in a sleep walking state. She will try to read to help to get back to sleep, she doesn't comprehend , dozing off, dozing out. She feels hung over. During the week she has to arise at 5:45 AM to get ready for work. She needs an alarm in the morning badly. She repeatedly uses the snooze button she is not refreshed or restored in the morning. Some mornings she will have a headache, she frequently has a dry, almost parched mouth.  She reports driving under the same medication effect. She knows that this is unsafe, and she is now willing to be retested and hopefully CPAP can control her sleepiness. She is besides the Seroquel and other medications that could affect her sleep including Synthroid, Lamictal which sometimes causes insomnia, Mirapex which can cause micro- sleep attacks, and Zoloft. Caucasian , 52 year old married female , who sleeps in the same room as her  husband, cool, quiet and dark. Sleeps on her side, but always ends in supine. Sleep talking starts 120 minutes after sleep . She does not drink any caffeine containing  beverages, no alcohol, she does not use tobacco products. 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   Interval history from 10/21/2015, I have the pleasure of seeing Janet Turner today following her recent sleep study. The patient underwent a polysomnography on 09/22/2015 which resulted in a mild apnea diagnosis the AHI was 9.2 the RDI was 16.9 indicating loud snoring, the AHI was not positional dependent, there were no prolonged oxygen desaturations but a short time of Carbone dioxide retention noted. Based on the very mild degree of sleep apnea I suggested that a dental device may be working for this patient rather than a CPAP machine. The patient just had retainers fixed in place is to her upper jaw so I'm not sure that she is a candidate for a dental device based on her dental needs. She was not happy with CPAP years ago but she changed and there are many interfaces are more comfortable than she used to CPAP very briefly she felt it did help but she could just get used to this device.    Review of Systems: Out of a complete 14 system review, the patient complains of only the following symptoms, and all other reviewed systems are negative. She has not experienced sleep paralysis, she sometimes seems to have a very short dream latency, possible hypoechoic hallucinations, she has experienced cataplectic attacks when angry or upset, her knees will buckle. She fell asleep driving- was clear;y told to have CPAP in place before she continues.  She has been non compliant with CPAP - returned the machine in May but did not tell us !! I explained that she would be discharged upon non- compliance.     Epworth score  14/ 24 - Driving restrictions stay in place.  High level of depression, OCD. Denies suicidal ideation.    Social History   Socioeconomic  History  . Marital status: Married    Spouse name: Not on file  . Number of children: Not on file  . Years of education: Not on file  . Highest education level: Not on  file  Social Needs  . Financial resource strain: Not on file  . Food insecurity - worry: Not on file  . Food insecurity - inability: Not on file  . Transportation needs - medical: Not on file  . Transportation needs - non-medical: Not on file  Occupational History  . Not on file  Tobacco Use  . Smoking status: Never Smoker  . Smokeless tobacco: Never Used  Substance and Sexual Activity  . Alcohol use: No    Alcohol/week: 0.0 oz  . Drug use: No  . Sexual activity: Yes    Partners: Male    Birth control/protection: Post-menopausal  Other Topics Concern  . Not on file  Social History Narrative  . Not on file    Family History  Problem Relation Age of Onset  . Osteoarthritis Mother   . Breast cancer Mother 11       treated with bilateral mastectomies, chemo, radiation  . Other Mother 26       reported to have positive genetic testing  . Factor V Leiden deficiency Mother   . Hypertension Father   . Melanoma Father   . Depression Father        PTSD  . Cancer Father 66       melanoma; recurred and metastasized at 37  . Alcoholism Unknown   . Stroke Unknown        Paternal Family--9/12 children after age 74  . Anuerysm Unknown        Paternal family  . Hypertension Unknown        Paternal family  . Ovarian cancer Maternal Grandmother 100       treated with surgery and chemo  . Lung cancer Maternal Grandmother 73       h/o smoking  . Breast cancer Maternal Grandmother 50       mastectomy.recurence age 74 on the other, mastectomy.treated with chemo and RX  . Colon polyps Maternal Grandmother        polyposis since middle-age  . Other Maternal Grandmother 50       abdominal desmoid tumor-no surgery due to blood vessel involvement  . Prostate cancer Paternal Grandfather 25  . Factor V Leiden  deficiency Sister   . Aneurysm Paternal Aunt 9       brain  . Aneurysm Maternal Grandfather 69       abdominal and cause of death  . Aneurysm Paternal Grandmother 1       brain  . Colon cancer Neg Hx   . Esophageal cancer Neg Hx   . Rectal cancer Neg Hx   . Stomach cancer Neg Hx     Past Medical History:  Diagnosis Date  . Abnormal Pap smear of cervix 2013, 2015   positive HR HPV  . Allergy   . Amenorrhea   . Anxiety   . Chicken pox   . Complication of anesthesia    oxygen level  and blood pressure dropped wtih anterior cervical fusion surgery   . Depression   . Family history of adverse reaction to anesthesia    mother- nausea and vomiting   . Gastritis    chronic, inactive  . Genetic testing 09/06/2016   Ms. Yodice underwent genetic counseling on 08/05/2016 and genetic testing for hereditary cancer syndromes on 08/23/2016. Her results were negative for mutations in all 46 genes analyzed by [facility]'s 46-gene Common Hereditary Cancers Panel. Genes analyzed include: APC, ATM, AXIN2, BARD1, BMPR1A, BRCA1, BRCA2, BRIP1, CDH1, CDKN2A, CHEK2, CTNNA1, DICER1,  EPCAM, GREM1, HOXB13, KIT, MEN1, MLH1, MSH2  . GERD (gastroesophageal reflux disease)   . Heart murmur   . Hyperlipemia   . Hypertension   . Hypoglycemia   . Hypopituitarism (Martha)   . Hypothyroidism   . Infertility, female   . Leg pain   . PTSD (post-traumatic stress disorder)   . Sleep apnea    no cpap     Past Surgical History:  Procedure Laterality Date  . ANTERIOR CERVICAL DISCECTOMY  2013   Ant Cervical Fusion C4-5 with diskectomy  . BREAST REDUCTION SURGERY  96  . CESAREAN SECTION  91  . CHOLECYSTECTOMY  97  . EYE SURGERY  [phone]   x's 3   . ganglion cyst removed    . LAPAROSCOPIC GASTRIC SLEEVE RESECTION N/A 05/28/2014   Procedure: LAPAROSCOPIC GASTRIC SLEEVE RESECTION;  Surgeon: Pedro Earls, MD;  Location: [facility];  Service: General;  Laterality: N/A;  . STRABISMUS SURGERY Bilateral 12/24/2016    Procedure: BILATERAL STRABISMUS REPAIR;  Surgeon: Everitt Amber, MD;  Location: [place] [facility];  Service: Ophthalmology;  Laterality: Bilateral;  . UPPER GASTROINTESTINAL ENDOSCOPY    . UPPER GI ENDOSCOPY N/A 05/28/2014   Procedure: UPPER GI ENDOSCOPY;  Surgeon: Pedro Earls, MD;  Location: [facility];  Service: General;  Laterality: N/A;    Current Outpatient Medications  Medication Sig Dispense Refill  . atorvastatin (LIPITOR) 20 MG tablet Take 1 tablet (20 mg total) by mouth daily. 30 tablet 0  . cyanocobalamin (,VITAMIN B-12,) 1000 MCG/ML injection 1 ml SQ qmonth 1 mL 5  . ferrous sulfate 220 (44 Fe) MG/5ML solution Take 5 mLs (220 mg total) by mouth daily. 150 mL 3  . HYDROcodone-acetaminophen (NORCO/VICODIN) 5-325 MG tablet 2 po qhs prn restless legs 60 tablet 0  . lamoTRIgine (LAMICTAL) 150 MG tablet Take 300 mg by mouth every morning.     Marland Kitchen levothyroxine (SYNTHROID, LEVOTHROID) 75 MCG tablet TAKE 1 TABLET (75 MCG TOTAL) BY MOUTH DAILY. 90 tablet 1  . lisinopril (PRINIVIL,ZESTRIL) 10 MG tablet Take 1 tablet (10 mg total) by mouth daily. 30 tablet 0  . pantoprazole (PROTONIX) 40 MG tablet Take 1 tablet (40 mg total) by mouth 2 (two) times daily before a meal. 180 tablet 1  . QUEtiapine (SEROQUEL) 200 MG tablet Take 400 mg by mouth at bedtime.   2  . sertraline (ZOLOFT) 100 MG tablet Take 150 mg by mouth every morning.      No current facility-administered medications for this visit.     Allergies as of 07/13/2017 - Review Complete 06/03/2017  Allergen Reaction Noted  . Abilify [aripiprazole] Other (See Comments) 08/05/2016  . Penicillins Hives and Itching 09/03/2012    Vitals: BP (!) 146/83   Pulse 60   Ht '4\' 9"'  (1.448 m)   Wt 153 lb (69.4 kg)   LMP 01/15/2000   BMI 33.11 kg/m  Last Weight:  Wt Readings from Last 1 Encounters:  07/13/17 153 lb (69.4 kg)   PHX:TAVW mass index is 33.11 kg/m.     Last Height:   Ht Readings from Last 1 Encounters:  07/13/17 4'  9" (1.448 m)    Physical exam:  General: The patient is awake, alert and appears not in acute distress. The patient is well groomed. Head: Normocephalic, atraumatic. Neck is supple. Mallampati 3,  neck circumference:12 Nasal airflow free, TMJ is not evident . Retrognathia is not seen.  She wears retainers.  Cardiovascular:  Regular rate and rhythm, without murmurs  or carotid bruit, and without distended neck veins. Respiratory: Lungs are clear to auscultation. Skin:  Without evidence of edema, or rash Trunk: BMI is elevated at 33 - but much reduced since weight loss surgery . The patient's posture is erect.   Neurologic exam : The patient is awake and alert, oriented to place and time.   Memory subjective  described as intact.  Attention span & concentration ability appears normal.  Speech is fluent,  without dysarthria, dysphonia or aphasia.  Mood and affect are appropriate.  Cranial nerves: Pupils are inequal- left eye is presenting with strabism upward, esotropia. and right pupil is larger. Left eye is decreased acuity . Right eye "droopy"  Ptosis. no nystagmus. Visual fields by finger perimetry are intact. Hearing to finger rub intact.  Facial sensation intact to fine touch. Facial motor strength is symmetric and tongue and uvula move midline. Shoulder shrug was symmetrical.   Finger to nose test shows a slight tremor near target, but no dysmetria ataxia.  There is equal grip strength in both hands, there is a slight increase in motor tone over both biceps and at the wrist, the mild tremor is not seen at rest.  This tremor does not affect handwriting.  Deep tendon reflexes were symmetric and actually rather brisk patellar and Achilles reflexes are elicited to a lesser degree triceps and biceps reflex.  There is no sensory abnormality the patient does not complain of numbness burning or cramping and spasms.  DTR symmetric, normal babinski response.   The patient was advised of the  nature of the RLS sleep disorder , the treatment options and risks for general a health and wellness arising from not treating the condition. I check feritin and TIBC and switch her to a neupro patch.  I do not intend to write for Diazepam or Vicodin, my reasoning is spelled out above. .  I spent more than 25  minutes of face to face time with the patient.  Greater than 50% of time was spent in counseling and coordination of care. We have discussed the diagnosis and differential and I answered the patient's questions.     Assessment:  After physical and neurologic examination, review of laboratory studies,  Personal review of imaging studies, reports of other /same  Imaging studies ,  Results of polysomnography/ neurophysiology testing and pre-existing records as far as provided in visit., my assessment is    1) RLS -Especially warned her about Mirapex and Requip which can cause Micro sleep attacks. She has been anemic , she had TIBC check with dr. Etter Sjogren- was placed on liquid iron supplement .  She is on Vit B12 ever since 2016 gastric sleeve procedure. She lost over 50 pounds. No longer on mirapex-  Switch to neupro since Requip and Mirapex have both failed.     2) positional dependent OSA- she still has very, very mild  apnea , she definitely is still a snorer and she had reported some parasomnia activities. These have resolved. I will ask her to take all night time medication by 8 PM and not later. She uses the  tennis ball method- It has worked.    3) EDS with an Epworth score of 18/ 24 points  , Fatigue severity score 23  , depression score 2 - Driving restricition in place. Mrs. Mooers has a long-standing history of severe hypersomnia, which could be partially medication induced, partially related to phses in mood/ bipolar disorder. .  I will not re-order a CPAP  machine- she was non compliant.    RV with Epworth in 3 month, NP with RLS follow up, neupro patch follow up.      Asencion Partridge  Jerimie Mancuso MD  07/13/2017   CC: [address], [address] [place], [place] [address]

## 2017-07-13 NOTE — Patient Instructions (Signed)
Rotigotine transdermal skin patch What is this medicine? ROTIGOTINE (roe TIG [place] teen) is used to control the signs and symptoms of Parkinson's disease or restless legs syndrome. This medicine may be used for other purposes; ask your health care provider or pharmacist if you have questions. COMMON BRAND NAME(S): Neupro What should I tell my health care provider before I take this medicine? They need to know if you have any of these conditions: -heart disease -high blood pressure -lung or breathing disease, like asthma -mental illness -skin cancer -sleep disorder -an unusual or allergic reaction to rotigotine, sulfites, other medicines, foods, dyes, or preservatives -pregnant or trying to get pregnant -breast-feeding How should I use this medicine? This medicine is for external use only. Follow the directions on the prescription label. Use exactly as directed. Wash hands after removing and applying this medicine. Change the patch each day at the same time. Apply the patch to an area of the upper arm or body that is clean, dry, and hairless. Do not use this patch on skin that is injured, irritated, oily, or calloused. Do not apply where the patch will be rubbed by tight clothing or a waistband. Do not apply to the same place more than once every 14 days in order to prevent skin irritation. Do not cut or trim the patch. Take your medicine at regular intervals. Do not take it more often than directed. Do not stop taking except on your doctor's advice. Always remove the old patch before you apply a new one. Remove patch slowly and carefully to avoid irritation. After removal, fold the patch so that it sticks to itself and throw it away. After removal of patch, wash the area with soap and water to remove any drug or adhesive. Baby oil or mineral oil may be used if needed. Do not use alcohol or other liquids. Talk to your pediatrician regarding the use of this medicine in children. Special care may be  needed. Overdosage: If you think you have taken too much of this medicine contact a poison control center or emergency room at once. NOTE: This medicine is only for you. Do not share this medicine with others. What if I miss a dose? If you miss a dose, take it as soon as you can. If it is almost time for your next dose, take only that dose. Do not take double or extra doses. What may interact with this medicine? -alcohol -antihistamines for allergy, cough and cold -certain medicines for sleep -medicines for depression, anxiety, or psychotic disturbances -metoclopramide -narcotic medicines for pain This list may not describe all possible interactions. Give your health care provider a list of all the medicines, herbs, non-prescription drugs, or dietary supplements you use. Also tell them if you smoke, drink alcohol, or use illegal drugs. Some items may interact with your medicine. What should I watch for while using this medicine? Visit your doctor for regular check ups. Tell your doctor or healthcare professional if your symptoms do not start to get better or if they get worse. You may get drowsy or dizzy. Do not drive, use machinery, or do anything that needs mental alertness until you know how this medicine affects you. Do not stand or sit up quickly, especially if you are an older patient. This reduces the risk of dizzy or fainting spells. Alcohol may interfere with the effect of this medicine. Avoid alcoholic drinks. If you find that you have sudden feelings of wanting to sleep during normal activities, like cooking, watching   television, or while driving or riding in a car, you should contact your health care professional. There have been reports of increased sexual urges or other strong urges such as gambling while taking this medicine. If you experience any of these while taking this medicine, you should report this to your health care provider as soon as possible. This medicine patch is  sensitive to certain body heat changes. If your skin gets too hot, more medicine will come out of the patch. Call your healthcare provider if you get a fever. Do not take hot baths. Do not sunbathe. Do not use hot tubs, saunas, hair dryers, heating pads, electric blankets, heated waterbeds, or tanning lamps. Do not do exercise that increases your body temperature. If you are going to have a magnetic resonance imaging (MRI) procedure, tell your MRI technician if you have this patch on your body. It must be removed before a MRI. What side effects may I notice from receiving this medicine? Side effects that you should report to your doctor or health care professional as soon as possible: -allergic reactions like skin rash, itching or hives, swelling of the face, lips, or tongue -anxiety, restlessness -breathing problems -confusion -dizziness -falling asleep during normal activities like driving -fast, irregular or slow heartbeat -feeling faint or lightheaded, falls -hallucination, loss of contact with reality -skin irritation, redness, or swelling -uncontrollable head, mouth, neck, arm, or leg movements -uncontrollable and excessive urges (examples: gambling, binge eating, shopping, having sex) Side effects that usually do not require medical attention (report to your doctor or health care professional if they continue or are bothersome): -constipation -difficulty sleeping -headache -loss of appetite -nausea, vomiting -stomach pain -weight gain This list may not describe all possible side effects. Call your doctor for medical advice about side effects. You may report side effects to FDA at 1-800-FDA-1088. Where should I keep my medicine? Keep out of the reach of children. Store at room temperature between 15 and 30 degrees C (59 and 86 degrees F). Keep container tightly closed. Store in original pouch until just before use. Throw away any unused medicine after the expiration date. NOTE: This  sheet is a summary. It may not cover all possible information. If you have questions about this medicine, talk to your doctor, pharmacist, or health care provider.  2018 Elsevier/Gold Standard (2015-12-05 15:25:23)  

## 2017-07-14 ENCOUNTER — Ambulatory Visit: Payer: Self-pay | Admitting: Gastroenterology

## 2017-07-18 ENCOUNTER — Telehealth: Payer: Self-pay | Admitting: Neurology

## 2017-07-18 ENCOUNTER — Ambulatory Visit
Admission: RE | Admit: 2017-07-18 | Discharge: 2017-07-18 | Disposition: A | Discharge status: Home / Self Care | Payer: BLUE CROSS/BLUE SHIELD | Source: Ambulatory Visit | Attending: Family Medicine | Admitting: Family Medicine

## 2017-07-18 DIAGNOSIS — Z1231 Encounter for screening mammogram for malignant neoplasm of breast: Secondary | ICD-10-CM | POA: Diagnosis not present

## 2017-07-18 DIAGNOSIS — F3175 Bipolar disorder, in partial remission, most recent episode depressed: Secondary | ICD-10-CM | POA: Diagnosis not present

## 2017-07-18 NOTE — Telephone Encounter (Signed)
PA [facility] ne[facility]ro patch for the patient. KEY [id number]. Can take up to 3 days before hearing back.

## 2017-07-18 NOTE — Telephone Encounter (Signed)
Janet Turner from [employer] called to inform the Neupro was approved and entered into [employer], they can be reached at [phone]

## 2017-07-19 ENCOUNTER — Telehealth: Payer: Self-pay | Admitting: Neurology

## 2017-07-19 NOTE — Telephone Encounter (Signed)
Pt called stating her attorney(for disability)is needing a letter stating the degree of her conditions, and Dr. Marko Staiohmeiers option on the pt working. Pt would like a call  Once letter is ready for pick up

## 2017-07-20 NOTE — Telephone Encounter (Signed)
Approved on March 11  Effective from 07/18/2017 through 05/09/2038

## 2017-07-20 NOTE — Telephone Encounter (Signed)
I will wait for Dr Laury AxonLowne to call or text me back about disability and if she supports this or not, and on which diagnosis. CD

## 2017-07-22 ENCOUNTER — Other Ambulatory Visit: Payer: Self-pay | Admitting: Family Medicine

## 2017-07-22 ENCOUNTER — Telehealth: Payer: Self-pay | Admitting: Neurology

## 2017-07-22 DIAGNOSIS — G2581 Restless legs syndrome: Secondary | ICD-10-CM

## 2017-07-22 NOTE — Telephone Encounter (Signed)
I am waiting to see if the 24 hour Neupro patch - ROTIGOTINE -works, her only neurologic condition addressed by me is RLS- and likely partially caused by diminished B12 and iron absorption, may be related to bariatric surgery-   RLS would not be disabling her from being gainfully employed and working.   I suggest she contacts her psychiatrist  and PCP about disability.  I am unable to provide a letter of support.  CD

## 2017-07-22 NOTE — Telephone Encounter (Signed)
Called pt & LVM (ok per DPR). RN left office number & hours in the message. Kindly informed pt that Dr. Vickey Hugerohmeier will be unable to write a letter for the patient and asked for a call back for further explanation.

## 2017-07-22 NOTE — Telephone Encounter (Signed)
No additional documentation- neurologically not disabled.

## 2017-07-22 NOTE — Telephone Encounter (Signed)
Dr Yolonda Kidaarmen dohmeyer called from [facility]--- pt is requesting disability for her RLS---- this does not qualify for disability and I agree She does have poorly controlled bipolar disorder but that would be per psych

## 2017-07-22 NOTE — Telephone Encounter (Signed)
I completely agree with you.   She would not qualify for disability for RLS--- she needs to talk to her psychiatrist  Thanks----  Myrene BuddyYvonne

## 2017-07-25 ENCOUNTER — Other Ambulatory Visit: Payer: Self-pay | Admitting: Neurology

## 2017-07-25 DIAGNOSIS — G2581 Restless legs syndrome: Secondary | ICD-10-CM

## 2017-07-25 MED ORDER — ROTIGOTINE 2 MG/24HR TD PT24: 1.0000 | MEDICATED_PATCH | Freq: Every day | TRANSDERMAL | 12 refills | Status: AC

## 2017-07-25 NOTE — Telephone Encounter (Signed)
Pt calling to inform that she is now taking 2 a day of the Rotigotine (NEUPRO) 1 MG/24HR PT24.  Pt would like to know if there is something comparable that is less expensive.  Pt states this Rotigotine (NEUPRO) 1 MG/24HR PT24 is very costly.  Please call

## 2017-07-25 NOTE — Telephone Encounter (Signed)
I have spoke with the patient and made her aware that there is nothing else that Dr Vickey Hugerohmeier would recommend that would be compared to the patch and that it is not available in generic at this time. The patient states that 2 mg is working great for her and has asked that we send a script in for the 2 mg patches. I have sent that in today.

## 2017-07-25 NOTE — Progress Notes (Signed)
Called the patient and we talked about it she states that its expensive but that it is working for her. She just wanted to make sure there was no other alternative before spending the money. I informed her there was not unfortunately. Pt verbalized understanding. She asked would be willing to order the 3577m patch since that has been what is working best for her. Order will be placed and sent to the pharmacy. Pt verbalized understanding.

## 2017-07-25 NOTE — Telephone Encounter (Signed)
There is no generic form , Neupro is branded and patented.  The most important question : DOES it HELP? If not, we don't have to go through patient assistance etc. CD

## 2017-07-26 ENCOUNTER — Ambulatory Visit (INDEPENDENT_AMBULATORY_CARE_PROVIDER_SITE_OTHER): Payer: BLUE CROSS/BLUE SHIELD | Admitting: Family Medicine

## 2017-07-26 ENCOUNTER — Encounter: Payer: Self-pay | Admitting: Family Medicine

## 2017-07-26 VITALS — BP 120/62 | HR 60 | Temp 97.6°F | Resp 16 | Ht <= 58 in | Wt 153.2 lb

## 2017-07-26 DIAGNOSIS — F3162 Bipolar disorder, current episode mixed, moderate: Secondary | ICD-10-CM | POA: Diagnosis not present

## 2017-07-26 DIAGNOSIS — E785 Hyperlipidemia, unspecified: Secondary | ICD-10-CM | POA: Diagnosis not present

## 2017-07-26 DIAGNOSIS — I10 Essential (primary) hypertension: Secondary | ICD-10-CM | POA: Diagnosis not present

## 2017-07-26 DIAGNOSIS — G2581 Restless legs syndrome: Secondary | ICD-10-CM

## 2017-07-26 DIAGNOSIS — Z0000 Encounter for general adult medical examination without abnormal findings: Secondary | ICD-10-CM

## 2017-07-26 DIAGNOSIS — K219 Gastro-esophageal reflux disease without esophagitis: Secondary | ICD-10-CM | POA: Diagnosis not present

## 2017-07-26 DIAGNOSIS — E038 Other specified hypothyroidism: Secondary | ICD-10-CM

## 2017-07-26 NOTE — Patient Instructions (Signed)
Preventive Care 40-64 Years, Female Preventive care refers to lifestyle choices and visits with your health care provider that can promote health and wellness. What does preventive care include?  A yearly physical exam. This is also called an annual well check.  Dental exams once or twice a year.  Routine eye exams. Ask your health care provider how often you should have your eyes checked.  Personal lifestyle choices, including: ? Daily care of your teeth and gums. ? Regular physical activity. ? Eating a healthy diet. ? Avoiding tobacco and drug use. ? Limiting alcohol use. ? Practicing safe sex. ? Taking low-dose aspirin daily starting at age 58. ? Taking vitamin and mineral supplements as recommended by your health care provider. What happens during an annual well check? The services and screenings done by your health care provider during your annual well check will depend on your age, overall health, lifestyle risk factors, and family history of disease. Counseling Your health care provider may ask you questions about your:  Alcohol use.  Tobacco use.  Drug use.  Emotional well-being.  Home and relationship well-being.  Sexual activity.  Eating habits.  Work and work Statistician.  Method of birth control.  Menstrual cycle.  Pregnancy history.  Screening You may have the following tests or measurements:  Height, weight, and BMI.  Blood pressure.  Lipid and cholesterol levels. These may be checked every 5 years, or more frequently if you are over 81 years old.  Skin check.  Lung cancer screening. You may have this screening every year starting at age 78 if you have a 30-pack-year history of smoking and currently smoke or have quit within the past 15 years.  Fecal occult blood test (FOBT) of the stool. You may have this test every year starting at age 65.  Flexible sigmoidoscopy or colonoscopy. You may have a sigmoidoscopy every 5 years or a colonoscopy  every 10 years starting at age 30.  Hepatitis C blood test.  Hepatitis B blood test.  Sexually transmitted disease (STD) testing.  Diabetes screening. This is done by checking your blood sugar (glucose) after you have not eaten for a while (fasting). You may have this done every 1-3 years.  Mammogram. This may be done every 1-2 years. Talk to your health care provider about when you should start having regular mammograms. This may depend on whether you have a family history of breast cancer.  BRCA-related cancer screening. This may be done if you have a family history of breast, ovarian, tubal, or peritoneal cancers.  Pelvic exam and Pap test. This may be done every 3 years starting at age 80. Starting at age 36, this may be done every 5 years if you have a Pap test in combination with an HPV test.  Bone density scan. This is done to screen for osteoporosis. You may have this scan if you are at high risk for osteoporosis.  Discuss your test results, treatment options, and if necessary, the need for more tests with your health care provider. Vaccines Your health care provider may recommend certain vaccines, such as:  Influenza vaccine. This is recommended every year.  Tetanus, diphtheria, and acellular pertussis (Tdap, Td) vaccine. You may need a Td booster every 10 years.  Varicella vaccine. You may need this if you have not been vaccinated.  Zoster vaccine. You may need this after age 5.  Measles, mumps, and rubella (MMR) vaccine. You may need at least one dose of MMR if you were born in  1958 or later. You may also need a second dose.  Pneumococcal 13-valent conjugate (PCV13) vaccine. You may need this if you have certain conditions and were not previously vaccinated.  Pneumococcal polysaccharide (PPSV23) vaccine. You may need one or two doses if you smoke cigarettes or if you have certain conditions.  Meningococcal vaccine. You may need this if you have certain  conditions.  Hepatitis A vaccine. You may need this if you have certain conditions or if you travel or work in places where you may be exposed to hepatitis A.  Hepatitis B vaccine. You may need this if you have certain conditions or if you travel or work in places where you may be exposed to hepatitis B.  Haemophilus influenzae type b (Hib) vaccine. You may need this if you have certain conditions.  Talk to your health care provider about which screenings and vaccines you need and how often you need them. This information is not intended to replace advice given to you by your health care provider. Make sure you discuss any questions you have with your health care provider. Document Released: 05/23/2015 Document Revised: 01/14/2016 Document Reviewed: 02/25/2015 Elsevier Interactive Patient Education  2018 Elsevier Inc.  

## 2017-07-26 NOTE — Assessment & Plan Note (Signed)
Better with neupro

## 2017-07-26 NOTE — Assessment & Plan Note (Signed)
Better with protonix bid

## 2017-07-26 NOTE — Progress Notes (Signed)
Subjective:  I acted as a Education administrator for [employer]. Yancey Flemings, [place]   Patient ID: Janet Turner, female    DOB: 1965/05/23, 52 y.o.   MRN: [id number]  Chief Complaint  Patient presents with  . Annual Exam    HPI  Patient is in today for annual exam.  No complaints.    Patient Care Team: Carollee Herter, Alferd Apa, DO as PCP - General (Family Medicine)   Past Medical History:  Diagnosis Date  . Abnormal Pap smear of cervix 2013, 2015   positive HR HPV  . Allergy   . Amenorrhea   . Anxiety   . Chicken pox   . Complication of anesthesia    oxygen level  and blood pressure dropped wtih anterior cervical fusion surgery   . Depression   . Family history of adverse reaction to anesthesia    mother- nausea and vomiting   . Gastritis    chronic, inactive  . Genetic testing 09/06/2016   Ms. Kittel underwent genetic counseling on 08/05/2016 and genetic testing for hereditary cancer syndromes on 08/23/2016. Her results were negative for mutations in all 46 genes analyzed by [facility]'s 46-gene Common Hereditary Cancers Panel. Genes analyzed include: APC, ATM, AXIN2, BARD1, BMPR1A, BRCA1, BRCA2, BRIP1, CDH1, CDKN2A, CHEK2, CTNNA1, DICER1, EPCAM, GREM1, HOXB13, KIT, MEN1, MLH1, MSH2  . GERD (gastroesophageal reflux disease)   . Heart murmur   . Hyperlipemia   . Hypertension   . Hypoglycemia   . Hypopituitarism ([place])   . Hypothyroidism   . Infertility, female   . Leg pain   . PTSD (post-traumatic stress disorder)   . Sleep apnea    no cpap     Past Surgical History:  Procedure Laterality Date  . ANTERIOR CERVICAL DISCECTOMY  2013   Ant Cervical Fusion C4-5 with diskectomy  . BREAST REDUCTION SURGERY  96  . CESAREAN SECTION  91  . CHOLECYSTECTOMY  97  . EYE SURGERY  [phone]   x's 3   . ganglion cyst removed    . LAPAROSCOPIC GASTRIC SLEEVE RESECTION N/A 05/28/2014   Procedure: LAPAROSCOPIC GASTRIC SLEEVE RESECTION;  Surgeon: Pedro Earls, MD;  Location: [facility];   Service: General;  Laterality: N/A;  . REDUCTION MAMMAPLASTY Bilateral 1996  . STRABISMUS SURGERY Bilateral 12/24/2016   Procedure: BILATERAL STRABISMUS REPAIR;  Surgeon: Everitt Amber, MD;  Location: [facility];  Service: Ophthalmology;  Laterality: Bilateral;  . UPPER GASTROINTESTINAL ENDOSCOPY    . UPPER GI ENDOSCOPY N/A 05/28/2014   Procedure: UPPER GI ENDOSCOPY;  Surgeon: Pedro Earls, MD;  Location: [facility];  Service: General;  Laterality: N/A;    Family History  Problem Relation Age of Onset  . Osteoarthritis Mother   . Breast cancer Mother 77       treated with bilateral mastectomies, chemo, radiation  . Other Mother 56       reported to have positive genetic testing  . Factor V Leiden deficiency Mother   . Hypertension Father   . Melanoma Father   . Depression Father        PTSD  . Cancer Father 4       melanoma; recurred and metastasized at 68  . Alcoholism Unknown   . Stroke Unknown        Paternal Family--9/12 children after age 55  . Anuerysm Unknown        Paternal family  . Hypertension Unknown        Paternal family  . Ovarian  cancer Maternal Grandmother 40       treated with surgery and chemo  . Lung cancer Maternal Grandmother 73       h/o smoking  . Breast cancer Maternal Grandmother 50       mastectomy.recurence age 56 on the other, mastectomy.treated with chemo and RX  . Colon polyps Maternal Grandmother        polyposis since middle-age  . Other Maternal Grandmother 50       abdominal desmoid tumor-no surgery due to blood vessel involvement  . Prostate cancer Paternal Grandfather 48  . Factor V Leiden deficiency Sister   . Aneurysm Paternal Aunt 35       brain  . Aneurysm Maternal Grandfather 69       abdominal and cause of death  . Aneurysm Paternal Grandmother 85       brain  . Colon cancer Neg Hx   . Esophageal cancer Neg Hx   . Rectal cancer Neg Hx   . Stomach cancer Neg Hx     Social History   Socioeconomic  History  . Marital status: Married    Spouse name: Not on file  . Number of children: Not on file  . Years of education: Not on file  . Highest education level: Not on file  Social Needs  . Financial resource strain: Not on file  . Food insecurity - worry: Not on file  . Food insecurity - inability: Not on file  . Transportation needs - medical: Not on file  . Transportation needs - non-medical: Not on file  Occupational History  . Not on file  Tobacco Use  . Smoking status: Never Smoker  . Smokeless tobacco: Never Used  Substance and Sexual Activity  . Alcohol use: No    Alcohol/week: 0.0 oz  . Drug use: No  . Sexual activity: Yes    Partners: Male    Birth control/protection: Post-menopausal  Other Topics Concern  . Not on file  Social History Narrative  . Not on file    Outpatient Medications Prior to Visit  Medication Sig Dispense Refill  . atorvastatin (LIPITOR) 20 MG tablet Take 1 tablet (20 mg total) by mouth daily. 30 tablet 0  . cyanocobalamin (,VITAMIN B-12,) 1000 MCG/ML injection 1 ml SQ qmonth 1 mL 5  . ferrous sulfate 220 (44 Fe) MG/5ML solution Take 5 mLs (220 mg total) by mouth daily. 150 mL 3  . lamoTRIgine (LAMICTAL) 150 MG tablet Take 300 mg by mouth every morning.     Marland Kitchen levothyroxine (SYNTHROID, LEVOTHROID) 75 MCG tablet TAKE 1 TABLET (75 MCG TOTAL) BY MOUTH DAILY. 90 tablet 1  . lisinopril (PRINIVIL,ZESTRIL) 10 MG tablet Take 1 tablet (10 mg total) by mouth daily. 30 tablet 0  . pantoprazole (PROTONIX) 40 MG tablet Take 1 tablet (40 mg total) by mouth 2 (two) times daily before a meal. 180 tablet 1  . QUEtiapine (SEROQUEL) 200 MG tablet Take 400 mg by mouth at bedtime.   2  . rotigotine (NEUPRO) 2 MG/24HR Place 1 patch onto the skin daily. 30 patch 12  . sertraline (ZOLOFT) 100 MG tablet Take 150 mg by mouth every morning.     Marland Kitchen HYDROcodone-acetaminophen (NORCO/VICODIN) 5-325 MG tablet 2 po qhs prn restless legs 60 tablet 0   No facility-administered  medications prior to visit.     Allergies  Allergen Reactions  . Abilify [Aripiprazole] Other (See Comments)    Caused dyskinesia (patient reported)  . Penicillins Hives and  Itching    Review of Systems  Constitutional: Negative for chills, fever and malaise/fatigue.  HENT: Negative for congestion and hearing loss.   Eyes: Negative for discharge.  Respiratory: Negative for cough, sputum production and shortness of breath.   Cardiovascular: Negative for chest pain, palpitations and leg swelling.  Gastrointestinal: Negative for abdominal pain, blood in stool, constipation, diarrhea, heartburn, nausea and vomiting.  Genitourinary: Negative for dysuria, frequency, hematuria and urgency.  Musculoskeletal: Negative for back pain, falls and myalgias.  Skin: Negative for rash.  Neurological: Negative for dizziness, sensory change, loss of consciousness, weakness and headaches.  Endo/Heme/Allergies: Negative for environmental allergies. Does not bruise/bleed easily.  Psychiatric/Behavioral: Negative for depression and suicidal ideas. The patient is not nervous/anxious and does not have insomnia.        Objective:    Physical Exam  Constitutional: She is oriented to person, place, and time. She appears well-developed and well-nourished. No distress.  HENT:  Head: Normocephalic and atraumatic.  Right Ear: External ear normal.  Left Ear: External ear normal.  Nose: Nose normal.  Mouth/Throat: Oropharynx is clear and moist.  Eyes: Conjunctivae and EOM are normal. Pupils are equal, round, and reactive to light. Right eye exhibits no discharge. Left eye exhibits no discharge.  Neck: Normal range of motion. Neck supple. No JVD present. No thyromegaly present.  Cardiovascular: Normal rate, regular rhythm, normal heart sounds and intact distal pulses.  No murmur heard. Pulmonary/Chest: Effort normal and breath sounds normal. No respiratory distress. She has no wheezes. She has no rales. She  exhibits no tenderness.  Abdominal: Soft. Bowel sounds are normal. She exhibits no distension and no mass. There is no tenderness. There is no rebound and no guarding.  Musculoskeletal: Normal range of motion. She exhibits no edema or tenderness.  Lymphadenopathy:    She has no cervical adenopathy.  Neurological: She is alert and oriented to person, place, and time. She has normal reflexes. No cranial nerve deficit.  Skin: Skin is warm and dry. No rash noted. She is not diaphoretic. No erythema.  Psychiatric: She has a normal mood and affect. Her behavior is normal. Judgment and thought content normal.  Nursing note and vitals reviewed.   BP 120/62 (BP Location: Left Arm, Patient Position: Sitting, Cuff Size: Large)   Pulse 60   Temp 97.6 F (36.4 C) (Oral)   Resp 16   Ht 4' 9.09" (1.45 m)   Wt 153 lb 3.2 oz (69.5 kg)   LMP 01/15/2000   SpO2 98%   BMI 33.05 kg/m  Wt Readings from Last 3 Encounters:  07/26/17 153 lb 3.2 oz (69.5 kg)  07/13/17 153 lb (69.4 kg)  06/03/17 153 lb 12.8 oz (69.8 kg)   BP Readings from Last 3 Encounters:  07/26/17 120/62  07/13/17 (!) 146/83  06/03/17 120/70     Immunization History  Administered Date(s) Administered  . Hepatitis B 05/12/2012, 07/11/2012, 01/22/2013  . Influenza-Unspecified 03/12/2011, 03/10/2012, 01/29/2013, 02/12/2014, 02/08/2015, 02/10/2016  . PPD Test 05/09/2012  . Tdap 07/20/2010, 05/11/2011, 05/09/2012, 11/25/2013    Health Maintenance  Topic Date Due  . COLONOSCOPY  11/15/2014  . MAMMOGRAM  07/19/2018  . PAP SMEAR  06/30/2019  . TETANUS/TDAP  11/26/2023  . INFLUENZA VACCINE  Completed  . HIV Screening  Completed    Lab Results  Component Value Date   WBC 6.1 07/26/2017   HGB 11.9 (L) 07/26/2017   HCT 35.7 (L) 07/26/2017   PLT 267.0 07/26/2017   GLUCOSE 85 07/26/2017  CHOL 231 (H) 07/26/2017   TRIG 168.0 (H) 07/26/2017   HDL 81.10 07/26/2017   LDLCALC 116 (H) 07/26/2017   ALT 16 07/26/2017   AST 20  07/26/2017   NA 142 07/26/2017   K 5.0 07/26/2017   CL 102 07/26/2017   CREATININE 1.09 07/26/2017   BUN 22 07/26/2017   CO2 28 07/26/2017   TSH 0.29 (L) 07/26/2017   HGBA1C 5.4 09/19/2014   MICROALBUR <0.7 09/19/2014    Lab Results  Component Value Date   TSH 0.29 (L) 07/26/2017   Lab Results  Component Value Date   WBC 6.1 07/26/2017   HGB 11.9 (L) 07/26/2017   HCT 35.7 (L) 07/26/2017   MCV 89.0 07/26/2017   PLT 267.0 07/26/2017   Lab Results  Component Value Date   NA 142 07/26/2017   K 5.0 07/26/2017   CO2 28 07/26/2017   GLUCOSE 85 07/26/2017   BUN 22 07/26/2017   CREATININE 1.09 07/26/2017   BILITOT 0.4 07/26/2017   ALKPHOS 118 (H) 07/26/2017   AST 20 07/26/2017   ALT 16 07/26/2017   PROT 7.5 07/26/2017   ALBUMIN 4.7 07/26/2017   CALCIUM 10.3 07/26/2017   ANIONGAP 7 04/10/2017   GFR 55.87 (L) 07/26/2017   Lab Results  Component Value Date   CHOL 231 (H) 07/26/2017   Lab Results  Component Value Date   HDL 81.10 07/26/2017   Lab Results  Component Value Date   LDLCALC 116 (H) 07/26/2017   Lab Results  Component Value Date   TRIG 168.0 (H) 07/26/2017   Lab Results  Component Value Date   CHOLHDL 3 07/26/2017   Lab Results  Component Value Date   HGBA1C 5.4 09/19/2014         Assessment & Plan:   Problem List Items Addressed This Visit      Unprioritized   Bipolar 1 disorder, mixed, moderate ([place])    Per psych      Gastroesophageal reflux disease without esophagitis    Better with protonix bid      HTN (hypertension)   Relevant Orders   CBC with Differential/Platelet (Completed)   Comprehensive metabolic panel (Completed)   Lipid panel (Completed)   TSH (Completed)   Hyperlipidemia LDL goal <100    Tolerating statin, encouraged heart healthy diet, avoid trans fats, minimize simple carbs and saturated fats. Increase exercise as tolerated      Relevant Orders   CBC with Differential/Platelet (Completed)   Comprehensive  metabolic panel (Completed)   Lipid panel (Completed)   TSH (Completed)   Hypothyroidism    Stable  Check labs      Preventative health care - Primary    ghm utd Check labs See Avs      Relevant Orders   CBC with Differential/Platelet (Completed)   Comprehensive metabolic panel (Completed)   Lipid panel (Completed)   TSH (Completed)   Restless legs syndrome (RLS)    Better with neupro         I have discontinued Dorthula Nettles. Hageman's HYDROcodone-acetaminophen. I am also having her maintain her sertraline, lamoTRIgine, QUEtiapine, levothyroxine, cyanocobalamin, ferrous sulfate, pantoprazole, atorvastatin, lisinopril, and rotigotine.  No orders of the defined types were placed in this encounter.   CMA served as Education administrator during this visit. History, Physical and Plan performed by medical provider. Documentation and orders reviewed and attested to.  Ann Held, DO

## 2017-07-26 NOTE — Assessment & Plan Note (Signed)
Stable Check labs 

## 2017-07-26 NOTE — Assessment & Plan Note (Signed)
Tolerating statin, encouraged heart healthy diet, avoid trans fats, minimize simple carbs and saturated fats. Increase exercise as tolerated 

## 2017-07-26 NOTE — Assessment & Plan Note (Signed)
Per psych 

## 2017-07-27 DIAGNOSIS — Z0000 Encounter for general adult medical examination without abnormal findings: Secondary | ICD-10-CM | POA: Insufficient documentation

## 2017-07-27 LAB — CBC WITH DIFFERENTIAL/PLATELET
BASOS ABS: 0.1 10*3/uL (ref 0.0–0.1)
Basophils Relative: 0.8 % (ref 0.0–3.0)
Eosinophils Absolute: 0.2 10*3/uL (ref 0.0–0.7)
Eosinophils Relative: 3.7 % (ref 0.0–5.0)
HCT: 35.7 % — ABNORMAL LOW (ref 36.0–46.0)
HEMOGLOBIN: 11.9 g/dL — AB (ref 12.0–15.0)
Lymphocytes Relative: 31.3 % (ref 12.0–46.0)
Lymphs Abs: 1.9 10*3/uL (ref 0.7–4.0)
MCHC: 33.5 g/dL (ref 30.0–36.0)
MCV: 89 fl (ref 78.0–100.0)
MONO ABS: 0.4 10*3/uL (ref 0.1–1.0)
MONOS PCT: 5.9 % (ref 3.0–12.0)
Neutro Abs: 3.5 10*3/uL (ref 1.4–7.7)
Neutrophils Relative %: 58.3 % (ref 43.0–77.0)
Platelets: 267 10*3/uL (ref 150.0–400.0)
RBC: 4.01 Mil/uL (ref 3.87–5.11)
RDW: 14.2 % (ref 11.5–15.5)
WBC: 6.1 10*3/uL (ref 4.0–10.5)

## 2017-07-27 LAB — COMPREHENSIVE METABOLIC PANEL
ALBUMIN: 4.7 g/dL (ref 3.5–5.2)
ALK PHOS: 118 U/L — AB (ref 39–117)
ALT: 16 U/L (ref 0–35)
AST: 20 U/L (ref 0–37)
BILIRUBIN TOTAL: 0.4 mg/dL (ref 0.2–1.2)
BUN: 22 mg/dL (ref 6–23)
CO2: 28 mEq/L (ref 19–32)
Calcium: 10.3 mg/dL (ref 8.4–10.5)
Chloride: 102 mEq/L (ref 96–112)
Creatinine, Ser: 1.09 mg/dL (ref 0.40–1.20)
GFR: 55.87 mL/min — ABNORMAL LOW (ref >60.00)
GLUCOSE: 85 mg/dL (ref 70–99)
POTASSIUM: 5 meq/L (ref 3.5–5.1)
SODIUM: 142 meq/L (ref 135–145)
TOTAL PROTEIN: 7.5 g/dL (ref 6.0–8.3)

## 2017-07-27 LAB — LIPID PANEL
CHOLESTEROL: 231 mg/dL — AB (ref 0–200)
HDL: 81.1 mg/dL (ref >39.00)
LDL Cholesterol: 116 mg/dL — ABNORMAL HIGH (ref 0–99)
NonHDL: 149.52
Total CHOL/HDL Ratio: 3
Triglycerides: 168 mg/dL — ABNORMAL HIGH (ref 0.0–149.0)
VLDL: 33.6 mg/dL (ref 0.0–40.0)

## 2017-07-27 LAB — TSH: TSH: 0.29 u[IU]/mL — AB (ref 0.35–4.50)

## 2017-07-27 NOTE — Assessment & Plan Note (Signed)
ghm utd Check labs  See  Avs 

## 2017-07-29 ENCOUNTER — Other Ambulatory Visit: Payer: Self-pay | Admitting: Family Medicine

## 2017-07-29 DIAGNOSIS — E039 Hypothyroidism, unspecified: Secondary | ICD-10-CM

## 2017-07-29 DIAGNOSIS — E782 Mixed hyperlipidemia: Secondary | ICD-10-CM

## 2017-07-29 MED ORDER — LEVOTHYROXINE SODIUM 50 MCG PO TABS: 50.0000 ug | ORAL_TABLET | Freq: Every day | ORAL | 3 refills | Status: DC

## 2017-07-29 MED ORDER — ATORVASTATIN CALCIUM 40 MG PO TABS: 40.0000 mg | ORAL_TABLET | Freq: Every day | ORAL | 3 refills | Status: AC

## 2017-08-09 ENCOUNTER — Ambulatory Visit (INDEPENDENT_AMBULATORY_CARE_PROVIDER_SITE_OTHER): Payer: BLUE CROSS/BLUE SHIELD | Admitting: *Deleted

## 2017-08-09 DIAGNOSIS — E538 Deficiency of other specified B group vitamins: Secondary | ICD-10-CM

## 2017-08-09 MED ORDER — CYANOCOBALAMIN 1000 MCG/ML IJ SOLN
1000.0000 ug | Freq: Once | INTRAMUSCULAR | Status: AC
  Administered 2017-08-09: 1000 ug via INTRAMUSCULAR

## 2017-08-09 NOTE — Progress Notes (Signed)
Pre visit review using our clinic review tool, if applicable. No additional management support is needed unless otherwise documented below in the visit note.  Patient here for monthly B12 injection.   1000 mcg B12 injection given to patient in right deltoid.Patient tolerated well.   Next injection for May has been scheduled today.

## 2017-08-17 ENCOUNTER — Telehealth: Payer: Self-pay | Admitting: Family Medicine

## 2017-08-17 NOTE — Telephone Encounter (Signed)
Copied from CRM [phone]#[id number]. Topic: Quick Communication - See Telephone Encounter >> Aug 17, 2017 12:35 PM Janet Turner, Janet Turner, NT wrote: CRM for notification. See Telephone encounter for: 08/17/17.  Pt. Needing to speak with Dr. Zola ButtonLowne-Chase or nurse about disability claim. [phone]

## 2017-08-18 NOTE — Telephone Encounter (Signed)
Patient has taken care of it

## 2017-08-21 ENCOUNTER — Encounter: Payer: Self-pay | Admitting: Family Medicine

## 2017-08-22 NOTE — Telephone Encounter (Signed)
Is he refusing to fill it out-- or just taking a long time It is a mental health issue so the paperwork needs to be filled out by a psych

## 2017-08-24 DIAGNOSIS — Z0271 Encounter for disability determination: Secondary | ICD-10-CM

## 2017-09-01 ENCOUNTER — Ambulatory Visit (INDEPENDENT_AMBULATORY_CARE_PROVIDER_SITE_OTHER): Payer: BLUE CROSS/BLUE SHIELD | Admitting: Family Medicine

## 2017-09-01 ENCOUNTER — Ambulatory Visit (HOSPITAL_BASED_OUTPATIENT_CLINIC_OR_DEPARTMENT_OTHER)
Admission: RE | Admit: 2017-09-01 | Discharge: 2017-09-01 | Disposition: A | Discharge status: Home / Self Care | Payer: BLUE CROSS/BLUE SHIELD | Source: Ambulatory Visit | Attending: Family Medicine | Admitting: Family Medicine

## 2017-09-01 ENCOUNTER — Encounter: Payer: Self-pay | Admitting: Family Medicine

## 2017-09-01 DIAGNOSIS — M25462 Effusion, left knee: Secondary | ICD-10-CM | POA: Diagnosis not present

## 2017-09-01 DIAGNOSIS — M1712 Unilateral primary osteoarthritis, left knee: Secondary | ICD-10-CM | POA: Diagnosis not present

## 2017-09-01 DIAGNOSIS — M25562 Pain in left knee: Secondary | ICD-10-CM | POA: Diagnosis not present

## 2017-09-01 NOTE — Patient Instructions (Signed)

## 2017-09-01 NOTE — Progress Notes (Signed)
rtho Patient ID: Janet Turner, female    DOB: 1966-03-03  Age: 52 y.o. MRN: [id number]    Subjective:  Subjective  HPI Janet Turner presents for L knee pain.  No known injury.    Review of Systems  Constitutional: Negative for activity change, appetite change, chills, fatigue, fever and unexpected weight change.  Respiratory: Negative for cough and shortness of breath.   Cardiovascular: Negative for chest pain and palpitations.  Gastrointestinal: Negative for abdominal distention and abdominal pain.  Genitourinary: Negative for difficulty urinating, dyspareunia, dysuria, flank pain, frequency, genital sores, hematuria, menstrual problem, pelvic pain, urgency, vaginal discharge and vaginal pain.  Musculoskeletal: Positive for joint swelling. Negative for back pain.  Psychiatric/Behavioral: Negative for behavioral problems and dysphoric mood. The patient is not nervous/anxious.     History Past Medical History:  Diagnosis Date  . Abnormal Pap smear of cervix 2013, 2015   positive HR HPV  . Allergy   . Amenorrhea   . Anxiety   . Chicken pox   . Complication of anesthesia    oxygen level  and blood pressure dropped wtih anterior cervical fusion surgery   . Depression   . Family history of adverse reaction to anesthesia    mother- nausea and vomiting   . Gastritis    chronic, inactive  . Genetic testing 09/06/2016   Ms. Faul underwent genetic counseling on 08/05/2016 and genetic testing for hereditary cancer syndromes on 08/23/2016. Her results were negative for mutations in all 46 genes analyzed by [facility]'s 46-gene Common Hereditary Cancers Panel. Genes analyzed include: APC, ATM, AXIN2, BARD1, BMPR1A, BRCA1, BRCA2, BRIP1, CDH1, CDKN2A, CHEK2, CTNNA1, DICER1, EPCAM, GREM1, HOXB13, KIT, MEN1, MLH1, MSH2  . GERD (gastroesophageal reflux disease)   . Heart murmur   . Hyperlipemia   . Hypertension   . Hypoglycemia   . Hypopituitarism ([place])   .  Hypothyroidism   . Infertility, female   . Leg pain   . PTSD (post-traumatic stress disorder)   . Sleep apnea    no cpap     She has a past surgical history that includes Eye surgery ([age],57); Cholecystectomy (97); Cesarean section (91); Breast reduction surgery (96); Anterior cervical discectomy (2013); ganglion cyst removed; Laparoscopic gastric sleeve resection (N/A, 05/28/2014); Upper gi endoscopy (N/A, 05/28/2014); Upper gastrointestinal endoscopy; Strabismus surgery (Bilateral, 12/24/2016); and Reduction mammaplasty (Bilateral, 1996).   Her family history includes Alcoholism in her unknown relative; Aneurysm (age of onset: 67) in her paternal aunt and paternal grandmother; Aneurysm (age of onset: 20) in her maternal grandfather; Anuerysm in her unknown relative; Breast cancer (age of onset: 70) in her maternal grandmother; Breast cancer (age of onset: 48) in her mother; Cancer (age of onset: 60) in her father; Colon polyps in her maternal grandmother; Depression in her father; Factor V Leiden deficiency in her mother and sister; Hypertension in her father and unknown relative; Lung cancer (age of onset: 27) in her maternal grandmother; Melanoma in her father; Osteoarthritis in her mother; Other (age of onset: 19) in her maternal grandmother; Other (age of onset: 54) in her mother; Ovarian cancer (age of onset: 40) in her maternal grandmother; Prostate cancer (age of onset: 2) in her paternal grandfather; Stroke in her unknown relative.She reports that she has never smoked. She has never used smokeless tobacco. She reports that she does not drink alcohol or use drugs.  Current Outpatient Medications on File Prior to Visit  Medication Sig Dispense Refill  . atorvastatin (LIPITOR) 40 MG tablet Take 1  tablet (40 mg total) by mouth daily. 30 tablet 3  . cyanocobalamin (,VITAMIN B-12,) 1000 MCG/ML injection 1 ml SQ qmonth 1 mL 5  . ferrous sulfate 220 (44 Fe) MG/5ML solution Take 5 mLs (220 mg total)  by mouth daily. 150 mL 3  . lamoTRIgine (LAMICTAL) 150 MG tablet Take 300 mg by mouth every morning.     Marland Kitchen levothyroxine (SYNTHROID, LEVOTHROID) 50 MCG tablet Take 1 tablet (50 mcg total) by mouth daily before breakfast. 30 tablet 3  . lisinopril (PRINIVIL,ZESTRIL) 10 MG tablet Take 1 tablet (10 mg total) by mouth daily. 30 tablet 0  . pantoprazole (PROTONIX) 40 MG tablet Take 1 tablet (40 mg total) by mouth 2 (two) times daily before a meal. 180 tablet 1  . QUEtiapine (SEROQUEL) 400 MG tablet Take 400 mg by mouth at bedtime.    . rotigotine (NEUPRO) 2 MG/24HR Place 1 patch onto the skin daily. 30 patch 12  . sertraline (ZOLOFT) 100 MG tablet Take 150 mg by mouth every morning.      No current facility-administered medications on file prior to visit.      Objective:  Objective  Physical Exam  Constitutional: She is oriented to person, place, and time. She appears well-developed and well-nourished.  HENT:  Head: Normocephalic and atraumatic.  Eyes: Conjunctivae and EOM are normal.  Neck: Normal range of motion. Neck supple. No JVD present. Carotid bruit is not present. No thyromegaly present.  Cardiovascular: Normal rate, regular rhythm and normal heart sounds.  No murmur heard. Pulmonary/Chest: Effort normal and breath sounds normal. No respiratory distress. She has no wheezes. She has no rales. She exhibits no tenderness.  Musculoskeletal: She exhibits edema and tenderness.       Left knee: Tenderness found. Medial joint line tenderness noted.  Neurological: She is alert and oriented to person, place, and time.  Psychiatric: She has a normal mood and affect.  Nursing note and vitals reviewed.  BP 112/70 (BP Location: Left Arm, Patient Position: Sitting, Cuff Size: Large)   Pulse (!) 59   Resp 16   Ht '4\' 9"'  (1.448 m)   Wt 151 lb 12.8 oz (68.9 kg)   LMP 01/15/2000   SpO2 98%   BMI 32.85 kg/m  Wt Readings from Last 3 Encounters:  09/01/17 151 lb 12.8 oz (68.9 kg)  07/26/17 153  lb 3.2 oz (69.5 kg)  07/13/17 153 lb (69.4 kg)     Lab Results  Component Value Date   WBC 6.1 07/26/2017   HGB 11.9 (L) 07/26/2017   HCT 35.7 (L) 07/26/2017   PLT 267.0 07/26/2017   GLUCOSE 85 07/26/2017   CHOL 231 (H) 07/26/2017   TRIG 168.0 (H) 07/26/2017   HDL 81.10 07/26/2017   LDLCALC 116 (H) 07/26/2017   ALT 16 07/26/2017   AST 20 07/26/2017   NA 142 07/26/2017   K 5.0 07/26/2017   CL 102 07/26/2017   CREATININE 1.09 07/26/2017   BUN 22 07/26/2017   CO2 28 07/26/2017   TSH 0.29 (L) 07/26/2017   HGBA1C 5.4 09/19/2014   MICROALBUR <0.7 09/19/2014    Mm Screening Breast Tomo Bilateral  Result Date: 07/19/2017 CLINICAL DATA:  Screening. EXAM: DIGITAL SCREENING BILATERAL MAMMOGRAM WITH TOMO AND CAD COMPARISON:  Previous exam(s). ACR Breast Density Category a: The breast tissue is almost entirely fatty. FINDINGS: There are no findings suspicious for malignancy. Images were processed with CAD. IMPRESSION: No mammographic evidence of malignancy. A result letter of this screening mammogram will be  mailed directly to the patient. RECOMMENDATION: Screening mammogram in one year. (Code:SM-B-01Y) BI-RADS CATEGORY  1: Negative. Electronically Signed   By: Franki Cabot M.D.   On: 07/19/2017 09:32     Assessment & Plan:  Plan  I am having Janet Turner. Janet Turner maintain her sertraline, lamoTRIgine, cyanocobalamin, ferrous sulfate, pantoprazole, lisinopril, rotigotine, levothyroxine, atorvastatin, and QUEtiapine.  No orders of the defined types were placed in this encounter.   Problem List Items Addressed This Visit      Unprioritized   Left knee pain    Ice Elevate leg Rest  Xray Consider ortho/ sport med      Relevant Orders   DG Knee Complete 4 Views Left   Ambulatory referral to Orthopedic Surgery      Follow-up: Return if symptoms worsen or fail to improve.  Ann Held, DO

## 2017-09-01 NOTE — Assessment & Plan Note (Signed)
Ice Elevate leg Rest  Xray Consider ortho/ sport med

## 2017-09-06 DIAGNOSIS — M25562 Pain in left knee: Secondary | ICD-10-CM | POA: Diagnosis not present

## 2017-09-08 ENCOUNTER — Ambulatory Visit (INDEPENDENT_AMBULATORY_CARE_PROVIDER_SITE_OTHER): Payer: BLUE CROSS/BLUE SHIELD

## 2017-09-08 DIAGNOSIS — E538 Deficiency of other specified B group vitamins: Secondary | ICD-10-CM

## 2017-09-08 MED ORDER — CYANOCOBALAMIN 1000 MCG/ML IJ SOLN
1000.0000 ug | Freq: Once | INTRAMUSCULAR | Status: AC
  Administered 2017-09-08: 1000 ug via INTRAMUSCULAR

## 2017-09-08 NOTE — Progress Notes (Signed)
`  Patient in today to receive her monthly b-12 injection.  She tolerated well in her left Deltoid.      Patient wanted PCP to know that she is seeing Delbert Harness and she may have to have a total knee replacement. She states her knee is rubbing like bone on bone. She just wanted you to know.

## 2017-09-12 DIAGNOSIS — F408 Other phobic anxiety disorders: Secondary | ICD-10-CM | POA: Diagnosis not present

## 2017-09-12 DIAGNOSIS — F3170 Bipolar disorder, currently in remission, most recent episode unspecified: Secondary | ICD-10-CM | POA: Diagnosis not present

## 2017-09-12 DIAGNOSIS — F1020 Alcohol dependence, uncomplicated: Secondary | ICD-10-CM | POA: Diagnosis not present

## 2017-09-13 DIAGNOSIS — F1020 Alcohol dependence, uncomplicated: Secondary | ICD-10-CM | POA: Diagnosis not present

## 2017-09-13 DIAGNOSIS — F408 Other phobic anxiety disorders: Secondary | ICD-10-CM | POA: Diagnosis not present

## 2017-09-13 DIAGNOSIS — F3170 Bipolar disorder, currently in remission, most recent episode unspecified: Secondary | ICD-10-CM | POA: Diagnosis not present

## 2017-09-16 ENCOUNTER — Encounter: Payer: Self-pay | Admitting: Family Medicine

## 2017-09-16 NOTE — Telephone Encounter (Signed)
mucinex or mucinex dm  Tylenol or ibuprofen for fever  But she should not be in a hospital with a fever She may need to go to sat clinic

## 2017-09-23 ENCOUNTER — Other Ambulatory Visit: Payer: Self-pay | Admitting: Family Medicine

## 2017-09-23 MED ORDER — LISINOPRIL 10 MG PO TABS: 10.0000 mg | ORAL_TABLET | Freq: Every day | ORAL | 4 refills | Status: DC

## 2017-09-26 DIAGNOSIS — F3170 Bipolar disorder, currently in remission, most recent episode unspecified: Secondary | ICD-10-CM | POA: Diagnosis not present

## 2017-09-26 DIAGNOSIS — F1020 Alcohol dependence, uncomplicated: Secondary | ICD-10-CM | POA: Diagnosis not present

## 2017-09-26 DIAGNOSIS — F408 Other phobic anxiety disorders: Secondary | ICD-10-CM | POA: Diagnosis not present

## 2017-09-27 ENCOUNTER — Encounter: Payer: Self-pay | Admitting: Family Medicine

## 2017-09-27 ENCOUNTER — Ambulatory Visit (INDEPENDENT_AMBULATORY_CARE_PROVIDER_SITE_OTHER): Payer: BLUE CROSS/BLUE SHIELD | Admitting: Family Medicine

## 2017-09-27 VITALS — BP 110/62 | HR 60 | Temp 98.3°F | Resp 16 | Ht <= 58 in | Wt 150.0 lb

## 2017-09-27 DIAGNOSIS — R319 Hematuria, unspecified: Secondary | ICD-10-CM | POA: Diagnosis not present

## 2017-09-27 DIAGNOSIS — R350 Frequency of micturition: Secondary | ICD-10-CM | POA: Diagnosis not present

## 2017-09-27 DIAGNOSIS — N390 Urinary tract infection, site not specified: Secondary | ICD-10-CM | POA: Diagnosis not present

## 2017-09-27 LAB — POCT URINALYSIS DIPSTICK
BILIRUBIN UA: NEGATIVE
Glucose, UA: NEGATIVE
KETONES UA: NEGATIVE
Nitrite, UA: NEGATIVE
PH UA: 6.5 (ref 5.0–8.0)
Protein, UA: POSITIVE — AB
SPEC GRAV UA: 1.015 (ref 1.010–1.025)
UROBILINOGEN UA: 0.2 U/dL

## 2017-09-27 MED ORDER — PHENAZOPYRIDINE HCL 200 MG PO TABS: ORAL_TABLET | ORAL | 0 refills | Status: DC

## 2017-09-27 MED ORDER — CIPROFLOXACIN HCL 250 MG PO TABS
250.0000 mg | ORAL_TABLET | Freq: Two times a day (BID) | ORAL | 0 refills | Status: DC
Start: 2017-09-27 — End: 2017-10-17

## 2017-09-27 NOTE — Progress Notes (Signed)
Subjective:  I acted as a Education administrator for [employer]. Yancey Flemings, [place]   Patient ID: Janet Turner, female    DOB: August 06, 1965, 52 y.o.   MRN: [id number]  Chief Complaint  Patient presents with  . Urinary Tract Infection    started today.    HPI  Patient is in today for urinary tract infection.  + dysuria, hematuia, frequency started today.   No fever, + flank pain--started yesterday  Patient Care Team: Carollee Herter, Alferd Apa, DO as PCP - General (Family Medicine)   Past Medical History:  Diagnosis Date  . Abnormal Pap smear of cervix 2013, 2015   positive HR HPV  . Allergy   . Amenorrhea   . Anxiety   . Chicken pox   . Complication of anesthesia    oxygen level  and blood pressure dropped wtih anterior cervical fusion surgery   . Depression   . Family history of adverse reaction to anesthesia    mother- nausea and vomiting   . Gastritis    chronic, inactive  . Genetic testing 09/06/2016   Ms. Choo underwent genetic counseling on 08/05/2016 and genetic testing for hereditary cancer syndromes on 08/23/2016. Her results were negative for mutations in all 46 genes analyzed by [facility]'s 46-gene Common Hereditary Cancers Panel. Genes analyzed include: APC, ATM, AXIN2, BARD1, BMPR1A, BRCA1, BRCA2, BRIP1, CDH1, CDKN2A, CHEK2, CTNNA1, DICER1, EPCAM, GREM1, HOXB13, KIT, MEN1, MLH1, MSH2  . GERD (gastroesophageal reflux disease)   . Heart murmur   . Hyperlipemia   . Hypertension   . Hypoglycemia   . Hypopituitarism ([place])   . Hypothyroidism   . Infertility, female   . Leg pain   . PTSD (post-traumatic stress disorder)   . Sleep apnea    no cpap     Past Surgical History:  Procedure Laterality Date  . ANTERIOR CERVICAL DISCECTOMY  2013   Ant Cervical Fusion C4-5 with diskectomy  . BREAST REDUCTION SURGERY  96  . CESAREAN SECTION  91  . CHOLECYSTECTOMY  97  . EYE SURGERY  [phone]   x's 3   . ganglion cyst removed    . LAPAROSCOPIC GASTRIC SLEEVE RESECTION N/A  05/28/2014   Procedure: LAPAROSCOPIC GASTRIC SLEEVE RESECTION;  Surgeon: Pedro Earls, MD;  Location: [facility];  Service: General;  Laterality: N/A;  . REDUCTION MAMMAPLASTY Bilateral 1996  . STRABISMUS SURGERY Bilateral 12/24/2016   Procedure: BILATERAL STRABISMUS REPAIR;  Surgeon: Everitt Amber, MD;  Location: [facility];  Service: Ophthalmology;  Laterality: Bilateral;  . UPPER GASTROINTESTINAL ENDOSCOPY    . UPPER GI ENDOSCOPY N/A 05/28/2014   Procedure: UPPER GI ENDOSCOPY;  Surgeon: Pedro Earls, MD;  Location: [facility];  Service: General;  Laterality: N/A;    Family History  Problem Relation Age of Onset  . Osteoarthritis Mother   . Breast cancer Mother 39       treated with bilateral mastectomies, chemo, radiation  . Other Mother 61       reported to have positive genetic testing  . Factor V Leiden deficiency Mother   . Hypertension Father   . Melanoma Father   . Depression Father        PTSD  . Cancer Father 49       melanoma; recurred and metastasized at 34  . Alcoholism Unknown   . Stroke Unknown        Paternal Family--9/12 children after age 66  . Anuerysm Unknown        Paternal  family  . Hypertension Unknown        Paternal family  . Ovarian cancer Maternal Grandmother 55       treated with surgery and chemo  . Lung cancer Maternal Grandmother 73       h/o smoking  . Breast cancer Maternal Grandmother 50       mastectomy.recurence age 88 on the other, mastectomy.treated with chemo and RX  . Colon polyps Maternal Grandmother        polyposis since middle-age  . Other Maternal Grandmother 50       abdominal desmoid tumor-no surgery due to blood vessel involvement  . Prostate cancer Paternal Grandfather 54  . Factor V Leiden deficiency Sister   . Aneurysm Paternal Aunt 31       brain  . Aneurysm Maternal Grandfather 69       abdominal and cause of death  . Aneurysm Paternal Grandmother 28       brain  . Colon cancer Neg Hx   .  Esophageal cancer Neg Hx   . Rectal cancer Neg Hx   . Stomach cancer Neg Hx     Social History   Socioeconomic History  . Marital status: Married    Spouse name: Not on file  . Number of children: Not on file  . Years of education: Not on file  . Highest education level: Not on file  Occupational History  . Not on file  Social Needs  . Financial resource strain: Not on file  . Food insecurity:    Worry: Not on file    Inability: Not on file  . Transportation needs:    Medical: Not on file    Non-medical: Not on file  Tobacco Use  . Smoking status: Never Smoker  . Smokeless tobacco: Never Used  Substance and Sexual Activity  . Alcohol use: No    Alcohol/week: 0.0 oz  . Drug use: No  . Sexual activity: Yes    Partners: Male    Birth control/protection: Post-menopausal  Lifestyle  . Physical activity:    Days per week: Not on file    Minutes per session: Not on file  . Stress: Not on file  Relationships  . Social connections:    Talks on phone: Not on file    Gets together: Not on file    Attends religious service: Not on file    Active member of club or organization: Not on file    Attends meetings of clubs or organizations: Not on file    Relationship status: Not on file  . Intimate partner violence:    Fear of current or ex partner: Not on file    Emotionally abused: Not on file    Physically abused: Not on file    Forced sexual activity: Not on file  Other Topics Concern  . Not on file  Social History Narrative  . Not on file    Outpatient Medications Prior to Visit  Medication Sig Dispense Refill  . atorvastatin (LIPITOR) 40 MG tablet Take 1 tablet (40 mg total) by mouth daily. 30 tablet 3  . cyanocobalamin (,VITAMIN B-12,) 1000 MCG/ML injection 1 ml SQ qmonth 1 mL 5  . ferrous sulfate 220 (44 Fe) MG/5ML solution Take 5 mLs (220 mg total) by mouth daily. 150 mL 3  . lamoTRIgine (LAMICTAL) 150 MG tablet Take 300 mg by mouth every morning.     Marland Kitchen  levothyroxine (SYNTHROID, LEVOTHROID) 50 MCG tablet Take 1 tablet (50  mcg total) by mouth daily before breakfast. 30 tablet 3  . lisinopril (PRINIVIL,ZESTRIL) 10 MG tablet Take 1 tablet (10 mg total) by mouth daily. 30 tablet 4  . pantoprazole (PROTONIX) 40 MG tablet Take 1 tablet (40 mg total) by mouth 2 (two) times daily before a meal. 180 tablet 1  . QUEtiapine (SEROQUEL) 400 MG tablet Take 400 mg by mouth at bedtime.    . rotigotine (NEUPRO) 2 MG/24HR Place 1 patch onto the skin daily. 30 patch 12  . sertraline (ZOLOFT) 100 MG tablet Take 150 mg by mouth every morning.      No facility-administered medications prior to visit.     Allergies  Allergen Reactions  . Abilify [Aripiprazole] Other (See Comments)    Caused dyskinesia (patient reported)  . Penicillins Hives and Itching    Review of Systems  Constitutional: Negative for chills, fever and malaise/fatigue.  HENT: Negative for congestion and hearing loss.   Eyes: Negative for discharge.  Respiratory: Negative for cough, sputum production and shortness of breath.   Cardiovascular: Negative for chest pain, palpitations and leg swelling.  Gastrointestinal: Negative for abdominal pain, blood in stool, constipation, diarrhea, heartburn, nausea and vomiting.  Genitourinary: Positive for dysuria, flank pain, frequency and hematuria. Negative for urgency.  Musculoskeletal: Negative for back pain, falls and myalgias.  Skin: Negative for rash.  Neurological: Negative for dizziness, sensory change, loss of consciousness, weakness and headaches.  Endo/Heme/Allergies: Negative for environmental allergies. Does not bruise/bleed easily.  Psychiatric/Behavioral: Negative for depression and suicidal ideas. The patient is not nervous/anxious and does not have insomnia.        Objective:    Physical Exam  Constitutional: She is oriented to person, place, and time. She appears well-developed and well-nourished.  HENT:  Head: Normocephalic  and atraumatic.  Eyes: Conjunctivae and EOM are normal.  Neck: Normal range of motion. Neck supple. No JVD present. Carotid bruit is not present. No thyromegaly present.  Cardiovascular: Normal rate, regular rhythm and normal heart sounds.  No murmur heard. Pulmonary/Chest: Effort normal and breath sounds normal. No respiratory distress. She has no wheezes. She has no rales. She exhibits no tenderness.  Abdominal: There is tenderness.  Musculoskeletal: She exhibits no edema.  Neurological: She is alert and oriented to person, place, and time.  Psychiatric: She has a normal mood and affect.  Nursing note and vitals reviewed.   BP 110/62 (BP Location: Left Arm, Patient Position: Sitting, Cuff Size: Large)   Pulse 60   Temp 98.3 F (36.8 C) (Oral)   Resp 16   Ht 4' 9" (1.448 m)   Wt 150 lb (68 kg)   LMP 01/15/2000   SpO2 97%   BMI 32.46 kg/m  Wt Readings from Last 3 Encounters:  09/27/17 150 lb (68 kg)  09/01/17 151 lb 12.8 oz (68.9 kg)  07/26/17 153 lb 3.2 oz (69.5 kg)   BP Readings from Last 3 Encounters:  09/27/17 110/62  09/01/17 112/70  07/26/17 120/62     Immunization History  Administered Date(s) Administered  . Hepatitis B 05/12/2012, 07/11/2012, 01/22/2013  . Influenza-Unspecified 03/12/2011, 03/10/2012, 01/29/2013, 02/12/2014, 02/08/2015, 02/10/2016  . PPD Test 05/09/2012  . Tdap 07/20/2010, 05/11/2011, 05/09/2012, 11/25/2013    Health Maintenance  Topic Date Due  . COLONOSCOPY  09/28/2018 (Originally 11/15/2014)  . INFLUENZA VACCINE  12/08/2017  . MAMMOGRAM  07/19/2018  . PAP SMEAR  06/30/2019  . TETANUS/TDAP  11/26/2023  . HIV Screening  Completed    Lab Results  Component  Value Date   WBC 6.1 07/26/2017   HGB 11.9 (L) 07/26/2017   HCT 35.7 (L) 07/26/2017   PLT 267.0 07/26/2017   GLUCOSE 85 07/26/2017   CHOL 231 (H) 07/26/2017   TRIG 168.0 (H) 07/26/2017   HDL 81.10 07/26/2017   LDLCALC 116 (H) 07/26/2017   ALT 16 07/26/2017   AST 20 07/26/2017     NA 142 07/26/2017   K 5.0 07/26/2017   CL 102 07/26/2017   CREATININE 1.09 07/26/2017   BUN 22 07/26/2017   CO2 28 07/26/2017   TSH 0.29 (L) 07/26/2017   HGBA1C 5.4 09/19/2014   MICROALBUR <0.7 09/19/2014    Lab Results  Component Value Date   TSH 0.29 (L) 07/26/2017   Lab Results  Component Value Date   WBC 6.1 07/26/2017   HGB 11.9 (L) 07/26/2017   HCT 35.7 (L) 07/26/2017   MCV 89.0 07/26/2017   PLT 267.0 07/26/2017   Lab Results  Component Value Date   NA 142 07/26/2017   K 5.0 07/26/2017   CO2 28 07/26/2017   GLUCOSE 85 07/26/2017   BUN 22 07/26/2017   CREATININE 1.09 07/26/2017   BILITOT 0.4 07/26/2017   ALKPHOS 118 (H) 07/26/2017   AST 20 07/26/2017   ALT 16 07/26/2017   PROT 7.5 07/26/2017   ALBUMIN 4.7 07/26/2017   CALCIUM 10.3 07/26/2017   ANIONGAP 7 04/10/2017   GFR 55.87 (L) 07/26/2017   Lab Results  Component Value Date   CHOL 231 (H) 07/26/2017   Lab Results  Component Value Date   HDL 81.10 07/26/2017   Lab Results  Component Value Date   LDLCALC 116 (H) 07/26/2017   Lab Results  Component Value Date   TRIG 168.0 (H) 07/26/2017   Lab Results  Component Value Date   CHOLHDL 3 07/26/2017   Lab Results  Component Value Date   HGBA1C 5.4 09/19/2014         Assessment & Plan:   Problem List Items Addressed This Visit    None    Visit Diagnoses    Urinary tract infection with hematuria, site unspecified    -  Primary   Relevant Medications   phenazopyridine (PYRIDIUM) 200 MG tablet   ciprofloxacin (CIPRO) 250 MG tablet   Frequent urination       Relevant Orders   POCT Urinalysis Dipstick (Completed)      I am having Janet Turner. Degrace start on phenazopyridine and ciprofloxacin. I am also having her maintain her sertraline, lamoTRIgine, cyanocobalamin, ferrous sulfate, pantoprazole, rotigotine, levothyroxine, atorvastatin, QUEtiapine, and lisinopril.  Meds ordered this encounter  Medications  . phenazopyridine  (PYRIDIUM) 200 MG tablet    Sig: 1 po tid x 2 days    Dispense:  6 tablet    Refill:  0  . ciprofloxacin (CIPRO) 250 MG tablet    Sig: Take 1 tablet (250 mg total) by mouth 2 (two) times daily.    Dispense:  6 tablet    Refill:  0    CMA served as scribe during this visit. History, Physical and Plan performed by medical provider. Documentation and orders reviewed and attested to.  Ann Held, DO

## 2017-09-27 NOTE — Patient Instructions (Signed)

## 2017-09-28 ENCOUNTER — Other Ambulatory Visit: Payer: Self-pay

## 2017-09-29 DIAGNOSIS — Z0271 Encounter for disability determination: Secondary | ICD-10-CM

## 2017-10-03 DIAGNOSIS — N390 Urinary tract infection, site not specified: Secondary | ICD-10-CM | POA: Diagnosis not present

## 2017-10-03 DIAGNOSIS — R531 Weakness: Secondary | ICD-10-CM | POA: Diagnosis not present

## 2017-10-04 ENCOUNTER — Telehealth: Payer: Self-pay | Admitting: *Deleted

## 2017-10-04 NOTE — Telephone Encounter (Signed)
Copied from CRM [id number]. Topic: Inquiry >> Oct 04, 2017  2:26 PM Terisa Starr wrote: Reason for CRM: Patient states that she started taking Lithium on 5/17 and just stopped taking it on Friday 5/24. It was giving her bad side effects as in she could hardly move so she went to the ER in [place] last night. She wants the nurse to call her back Call back is [phone]

## 2017-10-04 NOTE — Telephone Encounter (Signed)
Patient called psychiatrist and advised her to decrease.  She has weaned off.  She is currently taking medication for bipolar.  She just would like help with her walking.  When she walks she feels like she is drunk.  Advised her to make and appt to be seen.  She will be seen on 10/06/17

## 2017-10-04 NOTE — Telephone Encounter (Signed)
She needs to call her psychiatrist

## 2017-10-05 DIAGNOSIS — T50905A Adverse effect of unspecified drugs, medicaments and biological substances, initial encounter: Secondary | ICD-10-CM | POA: Diagnosis not present

## 2017-10-05 DIAGNOSIS — Z888 Allergy status to other drugs, medicaments and biological substances status: Secondary | ICD-10-CM | POA: Diagnosis not present

## 2017-10-05 DIAGNOSIS — Z79899 Other long term (current) drug therapy: Secondary | ICD-10-CM | POA: Diagnosis not present

## 2017-10-05 DIAGNOSIS — R251 Tremor, unspecified: Secondary | ICD-10-CM | POA: Diagnosis not present

## 2017-10-05 DIAGNOSIS — W19XXXA Unspecified fall, initial encounter: Secondary | ICD-10-CM | POA: Diagnosis not present

## 2017-10-05 DIAGNOSIS — M6281 Muscle weakness (generalized): Secondary | ICD-10-CM | POA: Diagnosis not present

## 2017-10-05 DIAGNOSIS — R319 Hematuria, unspecified: Secondary | ICD-10-CM | POA: Diagnosis not present

## 2017-10-05 DIAGNOSIS — Z9181 History of falling: Secondary | ICD-10-CM | POA: Diagnosis not present

## 2017-10-05 DIAGNOSIS — N390 Urinary tract infection, site not specified: Secondary | ICD-10-CM | POA: Diagnosis not present

## 2017-10-05 DIAGNOSIS — I10 Essential (primary) hypertension: Secondary | ICD-10-CM | POA: Diagnosis not present

## 2017-10-05 DIAGNOSIS — F329 Major depressive disorder, single episode, unspecified: Secondary | ICD-10-CM | POA: Diagnosis not present

## 2017-10-05 DIAGNOSIS — T887XXA Unspecified adverse effect of drug or medicament, initial encounter: Secondary | ICD-10-CM | POA: Diagnosis not present

## 2017-10-05 DIAGNOSIS — Z880 Allergy status to penicillin: Secondary | ICD-10-CM | POA: Diagnosis not present

## 2017-10-05 DIAGNOSIS — R2681 Unsteadiness on feet: Secondary | ICD-10-CM | POA: Diagnosis not present

## 2017-10-06 ENCOUNTER — Ambulatory Visit: Payer: BLUE CROSS/BLUE SHIELD | Admitting: Family Medicine

## 2017-10-12 ENCOUNTER — Ambulatory Visit (INDEPENDENT_AMBULATORY_CARE_PROVIDER_SITE_OTHER): Payer: BLUE CROSS/BLUE SHIELD | Admitting: Family Medicine

## 2017-10-12 ENCOUNTER — Encounter: Payer: Self-pay | Admitting: Family Medicine

## 2017-10-12 ENCOUNTER — Ambulatory Visit (INDEPENDENT_AMBULATORY_CARE_PROVIDER_SITE_OTHER): Payer: BLUE CROSS/BLUE SHIELD

## 2017-10-12 VITALS — BP 118/82 | HR 53 | Temp 98.0°F | Ht <= 58 in | Wt 149.2 lb

## 2017-10-12 DIAGNOSIS — F1020 Alcohol dependence, uncomplicated: Secondary | ICD-10-CM | POA: Diagnosis not present

## 2017-10-12 DIAGNOSIS — H6982 Other specified disorders of Eustachian tube, left ear: Secondary | ICD-10-CM | POA: Diagnosis not present

## 2017-10-12 DIAGNOSIS — E538 Deficiency of other specified B group vitamins: Secondary | ICD-10-CM

## 2017-10-12 DIAGNOSIS — F3170 Bipolar disorder, currently in remission, most recent episode unspecified: Secondary | ICD-10-CM | POA: Diagnosis not present

## 2017-10-12 DIAGNOSIS — F408 Other phobic anxiety disorders: Secondary | ICD-10-CM | POA: Diagnosis not present

## 2017-10-12 MED ORDER — FLUTICASONE PROPIONATE 50 MCG/ACT NA SUSP
2.0000 | Freq: Every day | NASAL | 1 refills | Status: DC
Start: 2017-10-12 — End: 2018-07-04

## 2017-10-12 MED ORDER — CYANOCOBALAMIN 1000 MCG/ML IJ SOLN
1000.0000 ug | Freq: Once | INTRAMUSCULAR | Status: AC
Start: 2017-10-12 — End: 2017-10-12
  Administered 2017-10-12: 1000 ug via INTRAMUSCULAR

## 2017-10-12 NOTE — Addendum Note (Signed)
Addended by: Steve RattlerBLEVINS, BAILEY A on: 10/12/2017 12:51 PM   Modules accepted: Level of Service

## 2017-10-12 NOTE — Patient Instructions (Addendum)
Flonase (fluticasone); nasal spray that is over the counter. 2 sprays each nostril, once daily. Aim towards the same side eye when you spray.  There are available OTC, and the generic versions, which may be cheaper, are in parentheses. Show this to a pharmacist if you have trouble finding any of these items.  This can take a few days to work.   Send me a message in 7-10 days if no improvement.   Let us know if you need anything.   Eustachian Tube Dysfunction The eustachian tube connects the middle ear to the back of the nose. It regulates air pressure in the middle ear by allowing air to move between the ear and nose. It also helps to drain fluid from the middle ear space. When the eustachian tube does not function properly, air pressure, fluid, or both can build up in the middle ear. Eustachian tube dysfunction can affect one or both ears. What are the causes? This condition happens when the eustachian tube becomes blocked or cannot open normally. This may result from:  Ear infections.  Colds and other upper respiratory infections.  Allergies.  Irritation, such as from cigarette smoke or acid from the stomach coming up into the esophagus (gastroesophageal reflux).  Sudden changes in air pressure, such as from descending in an airplane.  Abnormal growths in the nose or throat, such as nasal polyps, tumors, or enlarged tissue at the back of the throat (adenoids).  What increases the risk? This condition may be more likely to develop in people who smoke and people who are overweight. Eustachian tube dysfunction may also be more likely to develop in children, especially children who have:  Certain birth defects of the mouth, such as cleft palate.  Large tonsils and adenoids.  What are the signs or symptoms? Symptoms of this condition may include:  A feeling of fullness in the ear.  Ear pain.  Clicking or popping noises in the ear.  Ringing in the ear.  Hearing  loss.  Loss of balance.  Symptoms may get worse when the air pressure around you changes, such as when you travel to an area of high elevation or fly on an airplane. How is this diagnosed? This condition may be diagnosed based on:  Your symptoms.  A physical exam of your ear, nose, and throat.  Tests, such as those that measure: ? The movement of your eardrum (tympanogram). ? Your hearing (audiometry).  How is this treated? Treatment depends on the cause and severity of your condition. If your symptoms are mild, you may be able to relieve your symptoms by moving air into ("popping") your ears. If you have symptoms of fluid in your ears, treatment may include:  Decongestants.  Antihistamines.  Nasal sprays or ear drops that contain medicines that reduce swelling (steroids).  In some cases, you may need to have a procedure to drain the fluid in your eardrum (myringotomy). In this procedure, a small tube is placed in the eardrum to:  Drain the fluid.  Restore the air in the middle ear space.  Follow these instructions at home:  Take over-the-counter and prescription medicines only as told by your health care provider.  Use techniques to help pop your ears as recommended by your health care provider. These may include: ? Chewing gum. ? Yawning. ? Frequent, forceful swallowing. ? Closing your mouth, holding your nose closed, and gently blowing as if you are trying to blow air out of your nose.  Do not do any  of the following until your health care provider approves: ? Travel to high altitudes. ? Fly in airplanes. ? Work in a Estate agentpressurized cabin or room. ? Scuba dive.  Keep your ears dry. Dry your ears completely after showering or bathing.  Do not smoke.  Keep all follow-up visits as told by your health care provider. This is important. Contact a health care provider if:  Your symptoms do not go away after treatment.  Your symptoms come back after treatment.  You  are unable to pop your ears.  You have: ? A fever. ? Pain in your ear. ? Pain in your head or neck. ? Fluid draining from your ear.  Your hearing suddenly changes.  You become very dizzy.  You lose your balance. This information is not intended to replace advice given to you by your health care provider. Make sure you discuss any questions you have with your health care provider. Document Released: 05/23/2015 Document Revised: 10/02/2015 Document Reviewed: 05/15/2014 Elsevier Interactive Patient Education  [employer]2018 [employer] Inc[employer]

## 2017-10-12 NOTE — Progress Notes (Signed)
Pre visit review using our clinic review tool, if applicable. No additional management support is needed unless otherwise documented below in the visit note. 

## 2017-10-12 NOTE — Progress Notes (Signed)
Pre visit review using our clinic tool,if applicable. No additional management support is needed unless otherwise documented below in the visit note.   Pt here for monthly B12 injection per   B12 given IM, and pt tolerated injection well.  No complaints voiced this visit.  Next B12 injection scheduled for 1 month.

## 2017-10-12 NOTE — Progress Notes (Signed)
Chief Complaint  Patient presents with  . Ear Fullness    Pt is here for left ear fullness for 3 d. No pain, drainage, fevers, recent injury. Has not tried anything at home. Does not have seasonal allergies that she knows of.  ROS:  HEENT: +ear fullness Costitutional: Denies fevers  Past Medical History:  Diagnosis Date  . Abnormal Pap smear of cervix 2013, 2015   positive HR HPV  . Allergy   . Amenorrhea   . Anxiety   . Chicken pox   . Complication of anesthesia    oxygen level  and blood pressure dropped wtih anterior cervical fusion surgery   . Depression   . Family history of adverse reaction to anesthesia    mother- nausea and vomiting   . Gastritis    chronic, inactive  . Genetic testing 09/06/2016   Ms. Hoch underwent genetic counseling on 08/05/2016 and genetic testing for hereditary cancer syndromes on 08/23/2016. Her results were negative for mutations in all 46 genes analyzed by [facility]'s 46-gene Common Hereditary Cancers Panel. Genes analyzed include: APC, ATM, AXIN2, BARD1, BMPR1A, BRCA1, BRCA2, BRIP1, CDH1, CDKN2A, CHEK2, CTNNA1, DICER1, EPCAM, GREM1, HOXB13, KIT, MEN1, MLH1, MSH2  . GERD (gastroesophageal reflux disease)   . Heart murmur   . Hyperlipemia   . Hypertension   . Hypoglycemia   . Hypopituitarism ([place])   . Hypothyroidism   . Infertility, female   . Leg pain   . PTSD (post-traumatic stress disorder)   . Sleep apnea    no cpap       BP 118/82 (BP Location: Left Arm, Patient Position: Sitting, Cuff Size: Normal)   Pulse (!) 53   Temp 98 F (36.7 C) (Oral)   Ht '4\' 9"'  (1.448 m)   Wt 149 lb 4 oz (67.7 kg)   LMP 01/15/2000   SpO2 94%   BMI 32.30 kg/m  General: Awake, alert, appearing stated age HEENT:  L ear- Canal patent without drainage or erythema, TM is mildly retracted R ear- canal patent without drainage or erythema, TM is neg Nose- nares patent and without discharge Mouth- Lips, gums and dentition unremarkable, pharynx is  without erythema or exudate Neck: No adenopathy Lungs: Normal effort, no accessory muscle use Psych: Age appropriate judgment and insight, normal mood and affect  ETD (Eustachian tube dysfunction), left - Plan: fluticasone (FLONASE) 50 MCG/ACT nasal spray  Orders as above. No signs of effusion or infection. F/u in 2 weeks if symptoms fail to improve, sooner if needed. Pt voiced understanding and agreement to the plan.  [place], DO 10/12/17 2:37 PM

## 2017-10-14 NOTE — Progress Notes (Signed)
[facility]  PATIENT: Janet Turner DOB: [birth year]   REASON FOR VISIT: Follow-up for restless leg syndrome HISTORY FROM: Patient    HISTORY OF PRESENT ILLNESS:UPDATE 6/10/2019CM [name], 52 year old female returns for follow-up with history of restless leg syndrome.  She has failed Mirapex and Requip in the past after increased doses did not control her restless legs.  She was placed on Neupro patch 2 mg in March with her visit with Dr. Brett Fairy.  She says the medication has worked wonders.  She intermittently uses the tennis ball method for her obstructive sleep apnea which is mild.  She has been noncompliant with her CPAP and per Dr. Brett Fairy last night refuses to reorder CPAP.She has long-standing history of severe hypersomnia, which could be partially medication induced, partially related to phsses in mood/ bipolar disorder.  ESS score today 12 down from 18 at last visit.  She returns for reevaluation Interval history from 6 March 2019CD, Dr. Etter Sjogren referred I will mutual patient [name] back to neurology today to evaluate her for restless legs.  She had been on Mirapex in the past later switched to Requip after accelerating doses did no longer control restless legs.  She now feels that the Requip is no longer working either.  Dr. Laverta Baltimore had tested her for iron deficiency which was confirmed may be related to her bariatric surgery history.  She is now on a liquid form of iron and vitamin B12 supplements.  She has not noted that the iron or B12 supplements have helped her to control restless legs any better.  She was so frustrated that one night she had to go to emergency room.  There she was given hydrocodone and Valium and she felt some relief and that this makes also she does understand that both medications are highly addictive.  However she feels that she needs something to sleep and that her life quality is reduced without the ability of getting  restful sleep.  Her current medications include Lipitor, Lamictal, levothyroxine, lisinopril, Protonix, Seroquel, Requip, Zoloft, hydrocodone acetaminophen, and Levaquin.  As of January 2019 her hemoglobin was 11.2 low and her hematocrit 33.4 also low.  I did not see a total iron binding capacity of ferritin level but I like for her to have this tested today.  She avoids caffeine and alcohol as she has noticed a correlation with being more restless.  It is also not good for her depression.  She was advised in good sleep habits "sleep hygiene".  I do wonder if she may be better helped with a patch rather than taking tablets for restless legs.  Mirapex in the beginning allowed 6 hours of sleep, than only 4 and finally she took three doses a day. Mirapex which Failed at the highest dose, 1 mg three times a day, later requip at 0.5 mg and 1 mg did not work. She had OCD and bipolar disorder, and compulsions escalated.  I like to change to the Nupro patch, and avoid Vicodin or Diazepam. In March 2018 she had a nervous beak down, severe mood w swings bipolar and was hospitalized for suicidal risk . I also don't want to give her substances that could be used to commit suicide with,     Interval history from 01/13/2017, Janet Turner has a long-standing history of severe hypersomnia, which could be partially medication induced-  but she was also tested positive for obstructive sleep apnea at a moderate degree in 2014. At the time CPAP  seemed to be less attractive to her and she chose a weight loss surgery, assuming that this would treat the apnea.  I am meeting with Janet Turner today, who tried to use CPAP again in 2017 at home but frequently took the mask off in the middle of the night, did not get satisfying results-she  especially  didn't feel that she slept better. Her husband told her that she still gurgles in her sleep, which has scared her.She is wanting to address alternatives to CPAP or return to CPAP but  she has not used the machine for the last 5 months and actually turned in. She was using [facility]. In talking to Janet Turner she still is excessively daytime sleepy, her current Epworth sleepiness score is 13 points, her  fatigue severity is only 16 points, her AHI was under 10. At the time of the PSG SPLIT she endorsed the Epworth score at 17 points and we discussed a narcolepsy work up- she denies cataplexy, dream intrusion, dream enactment and parasomnia in the interval period . Now , we don't need to address narcolepsy. I would like for her to simply change her sleep habits and avoid sleeping on her back. We are discussing the tennis ball methode today.  She is status post gastric sleeve- weight stable for the last 18 month. BMI is still elevated.      REVIEW OF SYSTEMS: Full 14 system review of systems performed and notable only for those listed, all others are neg:  Constitutional: neg  Cardiovascular: neg Ear/Nose/Throat: neg  Skin: neg Eyes: neg Respiratory: neg Gastroitestinal: History of gastric sleeve surgery Hematology/Lymphatic: neg  Endocrine: neg Musculoskeletal:neg Allergy/Immunology: neg Neurological: neg Psychiatric: neg Sleep : Restless leg syndrome   ALLERGIES: Allergies  Allergen Reactions  . Abilify [Aripiprazole] Other (See Comments)    Caused dyskinesia (patient reported)  . Penicillins Hives and Itching    HOME MEDICATIONS: Outpatient Medications Prior to Visit  Medication Sig Dispense Refill  . atorvastatin (LIPITOR) 40 MG tablet Take 1 tablet (40 mg total) by mouth daily. 30 tablet 3  . cyanocobalamin (,VITAMIN B-12,) 1000 MCG/ML injection 1 ml SQ qmonth 1 mL 5  . fluticasone (FLONASE) 50 MCG/ACT nasal spray Place 2 sprays into both nostrils daily. 16 g 1  . lamoTRIgine (LAMICTAL) 150 MG tablet Take 300 mg by mouth every morning.     Marland Kitchen levothyroxine (SYNTHROID, LEVOTHROID) 50 MCG tablet Take 1 tablet (50 mcg total) by mouth daily before  breakfast. 30 tablet 3  . lisinopril (PRINIVIL,ZESTRIL) 10 MG tablet Take 1 tablet (10 mg total) by mouth daily. 30 tablet 4  . pantoprazole (PROTONIX) 40 MG tablet Take 1 tablet (40 mg total) by mouth 2 (two) times daily before a meal. 180 tablet 1  . rotigotine (NEUPRO) 2 MG/24HR Place 1 patch onto the skin daily. 30 patch 12  . sertraline (ZOLOFT) 100 MG tablet Take 150 mg by mouth every morning.     . ciprofloxacin (CIPRO) 250 MG tablet Take 1 tablet (250 mg total) by mouth 2 (two) times daily. 6 tablet 0  . ferrous sulfate 220 (44 Fe) MG/5ML solution Take 5 mLs (220 mg total) by mouth daily. 150 mL 3  . phenazopyridine (PYRIDIUM) 200 MG tablet 1 po tid x 2 days 6 tablet 0  . QUEtiapine (SEROQUEL) 400 MG tablet Take 400 mg by mouth at bedtime.     No facility-administered medications prior to visit.     PAST MEDICAL HISTORY: Past Medical History:  Diagnosis Date  . Abnormal Pap smear of cervix 2013, 2015   positive HR HPV  . Allergy   . Amenorrhea   . Anxiety   . Chicken pox   . Complication of anesthesia    oxygen level  and blood pressure dropped wtih anterior cervical fusion surgery   . Depression   . Family history of adverse reaction to anesthesia    mother- nausea and vomiting   . Gastritis    chronic, inactive  . Genetic testing 09/06/2016   Ms. Bewick underwent genetic counseling on 08/05/2016 and genetic testing for hereditary cancer syndromes on 08/23/2016. Her results were negative for mutations in all 46 genes analyzed by [facility]'s 46-gene Common Hereditary Cancers Panel. Genes analyzed include: APC, ATM, AXIN2, BARD1, BMPR1A, BRCA1, BRCA2, BRIP1, CDH1, CDKN2A, CHEK2, CTNNA1, DICER1, EPCAM, GREM1, HOXB13, KIT, MEN1, MLH1, MSH2  . GERD (gastroesophageal reflux disease)   . Heart murmur   . Hyperlipemia   . Hypertension   . Hypoglycemia   . Hypopituitarism ([place])   . Hypothyroidism   . Infertility, female   . Leg pain   . PTSD (post-traumatic stress disorder)     . Sleep apnea    no cpap     PAST SURGICAL HISTORY: Past Surgical History:  Procedure Laterality Date  . ANTERIOR CERVICAL DISCECTOMY  2013   Ant Cervical Fusion C4-5 with diskectomy  . BREAST REDUCTION SURGERY  96  . CESAREAN SECTION  91  . CHOLECYSTECTOMY  97  . EYE SURGERY  [phone]   x's 3   . ganglion cyst removed    . LAPAROSCOPIC GASTRIC SLEEVE RESECTION N/A 05/28/2014   Procedure: LAPAROSCOPIC GASTRIC SLEEVE RESECTION;  Surgeon: Pedro Earls, MD;  Location: [facility];  Service: General;  Laterality: N/A;  . REDUCTION MAMMAPLASTY Bilateral 1996  . STRABISMUS SURGERY Bilateral 12/24/2016   Procedure: BILATERAL STRABISMUS REPAIR;  Surgeon: Everitt Amber, MD;  Location: [facility];  Service: Ophthalmology;  Laterality: Bilateral;  . UPPER GASTROINTESTINAL ENDOSCOPY    . UPPER GI ENDOSCOPY N/A 05/28/2014   Procedure: UPPER GI ENDOSCOPY;  Surgeon: Pedro Earls, MD;  Location: [facility];  Service: General;  Laterality: N/A;    FAMILY HISTORY: Family History  Problem Relation Age of Onset  . Osteoarthritis Mother   . Breast cancer Mother 35       treated with bilateral mastectomies, chemo, radiation  . Other Mother 43       reported to have positive genetic testing  . Factor V Leiden deficiency Mother   . Hypertension Father   . Melanoma Father   . Depression Father        PTSD  . Cancer Father 69       melanoma; recurred and metastasized at 93  . Alcoholism Unknown   . Stroke Unknown        Paternal Family--9/12 children after age 9  . Anuerysm Unknown        Paternal family  . Hypertension Unknown        Paternal family  . Ovarian cancer Maternal Grandmother 70       treated with surgery and chemo  . Lung cancer Maternal Grandmother 73       h/o smoking  . Breast cancer Maternal Grandmother 50       mastectomy.recurence age 43 on the other, mastectomy.treated with chemo and RX  . Colon polyps Maternal Grandmother        polyposis since  middle-age  . Other  Maternal Grandmother 50       abdominal desmoid tumor-no surgery due to blood vessel involvement  . Prostate cancer Paternal Grandfather 31  . Factor V Leiden deficiency Sister   . Aneurysm Paternal Aunt 2       brain  . Aneurysm Maternal Grandfather 69       abdominal and cause of death  . Aneurysm Paternal Grandmother 12       brain  . Colon cancer Neg Hx   . Esophageal cancer Neg Hx   . Rectal cancer Neg Hx   . Stomach cancer Neg Hx     SOCIAL HISTORY: Social History   Socioeconomic History  . Marital status: Married    Spouse name: Not on file  . Number of children: Not on file  . Years of education: Not on file  . Highest education level: Not on file  Occupational History  . Not on file  Social Needs  . Financial resource strain: Not on file  . Food insecurity:    Worry: Not on file    Inability: Not on file  . Transportation needs:    Medical: Not on file    Non-medical: Not on file  Tobacco Use  . Smoking status: Never Smoker  . Smokeless tobacco: Never Used  Substance and Sexual Activity  . Alcohol use: No    Alcohol/week: 0.0 oz  . Drug use: No  . Sexual activity: Yes    Partners: Male    Birth control/protection: Post-menopausal  Lifestyle  . Physical activity:    Days per week: Not on file    Minutes per session: Not on file  . Stress: Not on file  Relationships  . Social connections:    Talks on phone: Not on file    Gets together: Not on file    Attends religious service: Not on file    Active member of club or organization: Not on file    Attends meetings of clubs or organizations: Not on file    Relationship status: Not on file  . Intimate partner violence:    Fear of current or ex partner: Not on file    Emotionally abused: Not on file    Physically abused: Not on file    Forced sexual activity: Not on file  Other Topics Concern  . Not on file  Social History Narrative  . Not on file     PHYSICAL  EXAM  Vitals:   10/17/17 1438  BP: 98/66  Pulse: (!) 59  SpO2: 95%  Weight: 148 lb 3.2 oz (67.2 kg)  Height: '4\' 9"'$  (1.448 m)   Body mass index is 32.07 kg/m.  Generalized: Well developed, obese female in no acute distress  Head: normocephalic and atraumatic,. Oropharynx benign mall[place]atti 3 Neck: Supple, circumference 13 Lungs clear Musculoskeletal: No deformity  Skin no edema or rash Neurological examination   Mentation: Alert oriented to time, place, history taking. Attention span and concentration appropriate. Recent and remote memory intact.  Follows all commands speech and language fluent. ESS 12  Cranial nerve II-XII: Pupils were equal round reactive to light extraocular movements were full, visual field were full on confrontational test. Facial sensation and strength were normal. hearing was intact to finger rubbing bilaterally. Uvula tongue midline. head turning and shoulder shrug were normal and symmetric.Tongue protrusion into cheek strength was normal. Motor: normal bulk and tone, full strength in the BUE, BLE, Sensory: normal and symmetric to light touch,  Coordination: finger-nose-finger, heel-to-shin bilaterally,  no dysmetria Reflexes: Symmetric upper and lower plantar responses were flexor bilaterally. Gait and Station: Rising up from seated position without assistance, normal stance,  moderate stride, good arm swing, smooth turning, able to perform tiptoe, and heel walking without difficulty. Tandem gait is steady  DIAGNOSTIC DATA (LABS, IMAGING, TESTING) - I reviewed patient records, labs, notes, testing and imaging myself where available.  Lab Results  Component Value Date   WBC 6.1 07/26/2017   HGB 11.9 (L) 07/26/2017   HCT 35.7 (L) 07/26/2017   MCV 89.0 07/26/2017   PLT 267.0 07/26/2017      Component Value Date/Time   NA 141 10/17/2017 0931   K 5.2 (H) 10/17/2017 0931   CL 102 10/17/2017 0931   CO2 32 10/17/2017 0931   GLUCOSE 85 10/17/2017 0931    BUN 16 10/17/2017 0931   CREATININE 1.24 (H) 10/17/2017 0931   CREATININE 0.97 08/05/2016 1500   CALCIUM 10.1 10/17/2017 0931   PROT 7.1 10/17/2017 0931   ALBUMIN 4.3 10/17/2017 0931   AST 20 10/17/2017 0931   ALT 19 10/17/2017 0931   ALKPHOS 123 (H) 10/17/2017 0931   BILITOT 0.6 10/17/2017 0931   GFRNONAA 56 (L) 04/10/2017 1600   GFRAA >60 04/10/2017 1600   Lab Results  Component Value Date   CHOL 163 10/17/2017   HDL 63.60 10/17/2017   LDLCALC 77 10/17/2017   TRIG 111.0 10/17/2017   CHOLHDL 3 10/17/2017   Lab Results  Component Value Date   HGBA1C 5.4 09/19/2014   Lab Results  Component Value Date   MGNOIBBC48 889 04/13/2016   Lab Results  Component Value Date   TSH 1.73 10/17/2017      ASSESSMENT AND PLAN  52 y.o. year old female to follow-up for restless leg syndrome.  She is failed Requip and Mirapex.  She has a history of anemia in the past and gastric sleeve surgery.  She was switched to Neupro with excellent results.  She has positional OSA, and uses the tennis ball  Method intermittently.  She has been noncompliant with CPAP in the past . ESS today 12 down from 18 at last visit. Mrs. Seipp has a long-standing history of severe hypersomnia, which could be partially medication induced, partially related to phases in mood/ bipolar disorder.  Marland Kitchen   PLAN: Continue Neupro for restless legs does not need refills Continue tennis ball method for positional dependent OSA F/U in 8 months Dennie Bible, [facility], [facility], APRN  [facility] [address], [place], [place] [address] [phone]

## 2017-10-17 ENCOUNTER — Ambulatory Visit (INDEPENDENT_AMBULATORY_CARE_PROVIDER_SITE_OTHER): Payer: BLUE CROSS/BLUE SHIELD | Admitting: Nurse Practitioner

## 2017-10-17 ENCOUNTER — Other Ambulatory Visit (INDEPENDENT_AMBULATORY_CARE_PROVIDER_SITE_OTHER): Payer: BLUE CROSS/BLUE SHIELD

## 2017-10-17 ENCOUNTER — Encounter: Payer: Self-pay | Admitting: Nurse Practitioner

## 2017-10-17 VITALS — BP 98/66 | HR 59 | Ht <= 58 in | Wt 148.2 lb

## 2017-10-17 DIAGNOSIS — E782 Mixed hyperlipidemia: Secondary | ICD-10-CM | POA: Diagnosis not present

## 2017-10-17 DIAGNOSIS — G4730 Sleep apnea, unspecified: Secondary | ICD-10-CM | POA: Diagnosis not present

## 2017-10-17 DIAGNOSIS — E039 Hypothyroidism, unspecified: Secondary | ICD-10-CM

## 2017-10-17 DIAGNOSIS — Z8659 Personal history of other mental and behavioral disorders: Secondary | ICD-10-CM | POA: Diagnosis not present

## 2017-10-17 DIAGNOSIS — F408 Other phobic anxiety disorders: Secondary | ICD-10-CM | POA: Diagnosis not present

## 2017-10-17 DIAGNOSIS — G4710 Hypersomnia, unspecified: Secondary | ICD-10-CM

## 2017-10-17 DIAGNOSIS — G2581 Restless legs syndrome: Secondary | ICD-10-CM

## 2017-10-17 DIAGNOSIS — F1020 Alcohol dependence, uncomplicated: Secondary | ICD-10-CM | POA: Diagnosis not present

## 2017-10-17 DIAGNOSIS — F3170 Bipolar disorder, currently in remission, most recent episode unspecified: Secondary | ICD-10-CM | POA: Diagnosis not present

## 2017-10-17 LAB — LIPID PANEL
CHOL/HDL RATIO: 3
Cholesterol: 163 mg/dL (ref 0–200)
HDL: 63.6 mg/dL (ref >39.00)
LDL Cholesterol: 77 mg/dL (ref 0–99)
NonHDL: 99.15
TRIGLYCERIDES: 111 mg/dL (ref 0.0–149.0)
VLDL: 22.2 mg/dL (ref 0.0–40.0)

## 2017-10-17 LAB — COMPREHENSIVE METABOLIC PANEL
ALT: 19 U/L (ref 0–35)
AST: 20 U/L (ref 0–37)
Albumin: 4.3 g/dL (ref 3.5–5.2)
Alkaline Phosphatase: 123 U/L — ABNORMAL HIGH (ref 39–117)
BILIRUBIN TOTAL: 0.6 mg/dL (ref 0.2–1.2)
BUN: 16 mg/dL (ref 6–23)
CALCIUM: 10.1 mg/dL (ref 8.4–10.5)
CO2: 32 meq/L (ref 19–32)
CREATININE: 1.24 mg/dL — AB (ref 0.40–1.20)
Chloride: 102 mEq/L (ref 96–112)
GFR: 48.11 mL/min — ABNORMAL LOW (ref >60.00)
Glucose, Bld: 85 mg/dL (ref 70–99)
Potassium: 5.2 mEq/L — ABNORMAL HIGH (ref 3.5–5.1)
Sodium: 141 mEq/L (ref 135–145)
TOTAL PROTEIN: 7.1 g/dL (ref 6.0–8.3)

## 2017-10-17 LAB — TSH: TSH: 1.73 u[IU]/mL (ref 0.35–4.50)

## 2017-10-17 NOTE — Patient Instructions (Signed)
Continue Neupro for restless legs does not need refills Continue tennis ball method for positional dependent OSA F/U in 8 months

## 2017-10-28 NOTE — Progress Notes (Signed)
I agree with the assessment and plan as directed by NP .The patient is known to me .   Marielena Harvell, MD  

## 2017-10-28 NOTE — Progress Notes (Signed)
I agree with the assessment and plan as directed by NP .The patient is known to me .   Venus Gilles, MD  

## 2017-11-03 DIAGNOSIS — F408 Other phobic anxiety disorders: Secondary | ICD-10-CM | POA: Diagnosis not present

## 2017-11-03 DIAGNOSIS — F3170 Bipolar disorder, currently in remission, most recent episode unspecified: Secondary | ICD-10-CM | POA: Diagnosis not present

## 2017-11-03 DIAGNOSIS — F1020 Alcohol dependence, uncomplicated: Secondary | ICD-10-CM | POA: Diagnosis not present

## 2017-11-07 ENCOUNTER — Telehealth: Payer: Self-pay

## 2017-11-07 NOTE — Telephone Encounter (Signed)
Copied from CRM [phone]. Topic: Quick Communication - Appointment Cancellation >> Nov 07, 2017 10:27 AM Lorrine KinMcGee, Demi B, NT wrote: Patient called to cancel appointment scheduled for 11/08/17. Patient has not rescheduled their appointment.  Route to department's PEC pool.

## 2017-11-08 ENCOUNTER — Ambulatory Visit: Payer: BLUE CROSS/BLUE SHIELD

## 2017-11-08 ENCOUNTER — Other Ambulatory Visit: Payer: BLUE CROSS/BLUE SHIELD

## 2017-11-14 ENCOUNTER — Other Ambulatory Visit: Payer: BLUE CROSS/BLUE SHIELD

## 2017-11-21 DIAGNOSIS — F3170 Bipolar disorder, currently in remission, most recent episode unspecified: Secondary | ICD-10-CM | POA: Diagnosis not present

## 2017-11-21 DIAGNOSIS — F408 Other phobic anxiety disorders: Secondary | ICD-10-CM | POA: Diagnosis not present

## 2017-11-21 DIAGNOSIS — F1020 Alcohol dependence, uncomplicated: Secondary | ICD-10-CM | POA: Diagnosis not present

## 2017-11-24 ENCOUNTER — Other Ambulatory Visit: Payer: Self-pay | Admitting: Family Medicine

## 2017-11-24 DIAGNOSIS — F408 Other phobic anxiety disorders: Secondary | ICD-10-CM | POA: Diagnosis not present

## 2017-11-24 DIAGNOSIS — F3170 Bipolar disorder, currently in remission, most recent episode unspecified: Secondary | ICD-10-CM | POA: Diagnosis not present

## 2017-11-24 DIAGNOSIS — F1020 Alcohol dependence, uncomplicated: Secondary | ICD-10-CM | POA: Diagnosis not present

## 2017-11-24 DIAGNOSIS — F4010 Social phobia, unspecified: Secondary | ICD-10-CM | POA: Diagnosis not present

## 2017-11-24 DIAGNOSIS — Z79899 Other long term (current) drug therapy: Secondary | ICD-10-CM | POA: Diagnosis not present

## 2017-12-07 DIAGNOSIS — M25562 Pain in left knee: Secondary | ICD-10-CM | POA: Diagnosis not present

## 2017-12-07 DIAGNOSIS — Z9884 Bariatric surgery status: Secondary | ICD-10-CM | POA: Diagnosis not present

## 2017-12-07 DIAGNOSIS — Z1211 Encounter for screening for malignant neoplasm of colon: Secondary | ICD-10-CM | POA: Diagnosis not present

## 2017-12-18 DIAGNOSIS — Z1211 Encounter for screening for malignant neoplasm of colon: Secondary | ICD-10-CM | POA: Diagnosis not present

## 2017-12-21 DIAGNOSIS — F408 Other phobic anxiety disorders: Secondary | ICD-10-CM | POA: Diagnosis not present

## 2017-12-21 DIAGNOSIS — F4010 Social phobia, unspecified: Secondary | ICD-10-CM | POA: Diagnosis not present

## 2017-12-21 DIAGNOSIS — F1020 Alcohol dependence, uncomplicated: Secondary | ICD-10-CM | POA: Diagnosis not present

## 2017-12-21 DIAGNOSIS — F3170 Bipolar disorder, currently in remission, most recent episode unspecified: Secondary | ICD-10-CM | POA: Diagnosis not present

## 2018-01-02 ENCOUNTER — Telehealth: Payer: Self-pay | Admitting: Nurse Practitioner

## 2018-01-02 MED ORDER — ROTIGOTINE 4 MG/24HR TD PT24: 1.0000 | MEDICATED_PATCH | Freq: Every day | TRANSDERMAL | 12 refills | Status: DC

## 2018-01-02 NOTE — Telephone Encounter (Signed)
She can try that dose of 4 mg.  That is the maximum dose for restless legs.  She can pick up samples

## 2018-01-02 NOTE — Telephone Encounter (Signed)
Spoke to pt and offered taking 4mg /24hour ptch which is maximum dose for RLS.  She can try sample if she likes and see if this helps.  Will pick up samples sometime this week.  Placed up front.

## 2018-01-02 NOTE — Telephone Encounter (Signed)
Patient states rotigotine (NEUPRO) 2 MG/24HR is not helping with restless legs. Can mg be increased? Patient wants to know if we have samples she can get. She [place] [facility] [place],

## 2018-01-02 NOTE — Telephone Encounter (Signed)
We do have neupro 4mg /24hours (sample box of 7).  Do you want to try?

## 2018-01-02 NOTE — Addendum Note (Signed)
Addended by: Guy BeginYOUNG, Ariel Dimitri S on: 01/02/2018 04:20 PM   Modules accepted: Orders

## 2018-01-10 DIAGNOSIS — N3000 Acute cystitis without hematuria: Secondary | ICD-10-CM | POA: Diagnosis not present

## 2018-01-10 DIAGNOSIS — R300 Dysuria: Secondary | ICD-10-CM | POA: Diagnosis not present

## 2018-01-11 MED ORDER — ROTIGOTINE 4 MG/24HR TD PT24: 1.0000 | MEDICATED_PATCH | Freq: Every day | TRANSDERMAL | 5 refills | Status: AC

## 2018-01-11 NOTE — Telephone Encounter (Addendum)
Patient reports neupro patch is going well and would like a prescription submitted to the pharmacy. Her last office visit was 10/17/2017.  Rx submitted to the pharmacy for Neupro 4 mg 1 patch daily #30 with 5 refills.  I contacted the patient and advised we have sent the prescription to [facility]. Patient advised the medication may come back needing a PA and we would be in touch if this was the case.  Patient verbalized understanding and had no further questions at this time.

## 2018-01-11 NOTE — Telephone Encounter (Signed)
Pt calling stating the rotigotine (NEUPRO) 4 MG/24HR has worked well.  Pt would like a prescription called into  [facility] - [place], [place] - [address] [phone] (Phone) [phone] (Fax)

## 2018-01-11 NOTE — Addendum Note (Signed)
Addended by: Ann Maki T on: 01/11/2018 04:52 PM   Modules accepted: Orders

## 2018-01-18 DIAGNOSIS — F3170 Bipolar disorder, currently in remission, most recent episode unspecified: Secondary | ICD-10-CM | POA: Diagnosis not present

## 2018-01-18 DIAGNOSIS — F4010 Social phobia, unspecified: Secondary | ICD-10-CM | POA: Diagnosis not present

## 2018-01-18 DIAGNOSIS — F1020 Alcohol dependence, uncomplicated: Secondary | ICD-10-CM | POA: Diagnosis not present

## 2018-01-18 DIAGNOSIS — F408 Other phobic anxiety disorders: Secondary | ICD-10-CM | POA: Diagnosis not present

## 2018-01-24 DIAGNOSIS — K219 Gastro-esophageal reflux disease without esophagitis: Secondary | ICD-10-CM | POA: Diagnosis not present

## 2018-01-24 DIAGNOSIS — R109 Unspecified abdominal pain: Secondary | ICD-10-CM | POA: Diagnosis not present

## 2018-01-26 DIAGNOSIS — R1310 Dysphagia, unspecified: Secondary | ICD-10-CM | POA: Diagnosis not present

## 2018-01-26 DIAGNOSIS — Z903 Acquired absence of stomach [part of]: Secondary | ICD-10-CM | POA: Diagnosis not present

## 2018-01-26 DIAGNOSIS — K219 Gastro-esophageal reflux disease without esophagitis: Secondary | ICD-10-CM | POA: Diagnosis not present

## 2018-02-01 DIAGNOSIS — R7989 Other specified abnormal findings of blood chemistry: Secondary | ICD-10-CM | POA: Diagnosis not present

## 2018-02-03 DIAGNOSIS — K2970 Gastritis, unspecified, without bleeding: Secondary | ICD-10-CM | POA: Diagnosis not present

## 2018-02-03 DIAGNOSIS — K3189 Other diseases of stomach and duodenum: Secondary | ICD-10-CM | POA: Diagnosis not present

## 2018-02-03 DIAGNOSIS — R1310 Dysphagia, unspecified: Secondary | ICD-10-CM | POA: Diagnosis not present

## 2018-02-09 DIAGNOSIS — Z23 Encounter for immunization: Secondary | ICD-10-CM | POA: Diagnosis not present

## 2018-02-16 DIAGNOSIS — F1020 Alcohol dependence, uncomplicated: Secondary | ICD-10-CM | POA: Diagnosis not present

## 2018-02-16 DIAGNOSIS — F3170 Bipolar disorder, currently in remission, most recent episode unspecified: Secondary | ICD-10-CM | POA: Diagnosis not present

## 2018-02-16 DIAGNOSIS — G4733 Obstructive sleep apnea (adult) (pediatric): Secondary | ICD-10-CM | POA: Diagnosis not present

## 2018-02-16 DIAGNOSIS — F408 Other phobic anxiety disorders: Secondary | ICD-10-CM | POA: Diagnosis not present

## 2018-02-16 DIAGNOSIS — F4010 Social phobia, unspecified: Secondary | ICD-10-CM | POA: Diagnosis not present

## 2018-02-18 DIAGNOSIS — R300 Dysuria: Secondary | ICD-10-CM | POA: Diagnosis not present

## 2018-02-18 DIAGNOSIS — N390 Urinary tract infection, site not specified: Secondary | ICD-10-CM | POA: Diagnosis not present

## 2018-03-12 ENCOUNTER — Encounter: Payer: Self-pay | Admitting: Genetic Counselor

## 2018-03-12 NOTE — Progress Notes (Signed)
UPDATED: ATM [id number] VUS has been reclassified to Likely Benign.  The change in variant classification was made as a result of re-review of the evidence in light of new variant interpretation guidelines and/or new information.  The updated report date is 03/08/2018.

## 2018-03-16 DIAGNOSIS — F408 Other phobic anxiety disorders: Secondary | ICD-10-CM | POA: Diagnosis not present

## 2018-03-16 DIAGNOSIS — F3170 Bipolar disorder, currently in remission, most recent episode unspecified: Secondary | ICD-10-CM | POA: Diagnosis not present

## 2018-03-16 DIAGNOSIS — F4010 Social phobia, unspecified: Secondary | ICD-10-CM | POA: Diagnosis not present

## 2018-03-16 DIAGNOSIS — F1020 Alcohol dependence, uncomplicated: Secondary | ICD-10-CM | POA: Diagnosis not present

## 2018-04-04 DIAGNOSIS — J111 Influenza due to unidentified influenza virus with other respiratory manifestations: Secondary | ICD-10-CM | POA: Diagnosis not present

## 2018-04-13 DIAGNOSIS — F3170 Bipolar disorder, currently in remission, most recent episode unspecified: Secondary | ICD-10-CM | POA: Diagnosis not present

## 2018-04-13 DIAGNOSIS — F1020 Alcohol dependence, uncomplicated: Secondary | ICD-10-CM | POA: Diagnosis not present

## 2018-04-13 DIAGNOSIS — F4010 Social phobia, unspecified: Secondary | ICD-10-CM | POA: Diagnosis not present

## 2018-04-13 DIAGNOSIS — F408 Other phobic anxiety disorders: Secondary | ICD-10-CM | POA: Diagnosis not present

## 2018-04-20 DIAGNOSIS — F4010 Social phobia, unspecified: Secondary | ICD-10-CM | POA: Diagnosis not present

## 2018-04-20 DIAGNOSIS — F3170 Bipolar disorder, currently in remission, most recent episode unspecified: Secondary | ICD-10-CM | POA: Diagnosis not present

## 2018-04-20 DIAGNOSIS — F1020 Alcohol dependence, uncomplicated: Secondary | ICD-10-CM | POA: Diagnosis not present

## 2018-04-20 DIAGNOSIS — F408 Other phobic anxiety disorders: Secondary | ICD-10-CM | POA: Diagnosis not present

## 2018-05-02 DIAGNOSIS — H2512 Age-related nuclear cataract, left eye: Secondary | ICD-10-CM | POA: Diagnosis not present

## 2018-05-02 DIAGNOSIS — D3131 Benign neoplasm of right choroid: Secondary | ICD-10-CM | POA: Diagnosis not present

## 2018-05-02 DIAGNOSIS — H5000 Unspecified esotropia: Secondary | ICD-10-CM | POA: Diagnosis not present

## 2018-05-02 DIAGNOSIS — H35413 Lattice degeneration of retina, bilateral: Secondary | ICD-10-CM | POA: Diagnosis not present

## 2018-05-02 DIAGNOSIS — H2513 Age-related nuclear cataract, bilateral: Secondary | ICD-10-CM | POA: Diagnosis not present

## 2018-05-05 DIAGNOSIS — T63301A Toxic effect of unspecified spider venom, accidental (unintentional), initial encounter: Secondary | ICD-10-CM | POA: Diagnosis not present

## 2018-05-05 DIAGNOSIS — R21 Rash and other nonspecific skin eruption: Secondary | ICD-10-CM | POA: Diagnosis not present

## 2018-05-12 DIAGNOSIS — F3170 Bipolar disorder, currently in remission, most recent episode unspecified: Secondary | ICD-10-CM | POA: Diagnosis not present

## 2018-05-12 DIAGNOSIS — F408 Other phobic anxiety disorders: Secondary | ICD-10-CM | POA: Diagnosis not present

## 2018-05-12 DIAGNOSIS — F1020 Alcohol dependence, uncomplicated: Secondary | ICD-10-CM | POA: Diagnosis not present

## 2018-05-12 DIAGNOSIS — F4010 Social phobia, unspecified: Secondary | ICD-10-CM | POA: Diagnosis not present

## 2018-05-16 IMAGING — CR DG CHEST 2V
2 series · 2 of 2 positions shown · non-contrast
Comparison: 03/15/2014

CLINICAL DATA: Fever, nausea, and abdominal pain.

EXAM:
CHEST  2 VIEW

[w chest pa]
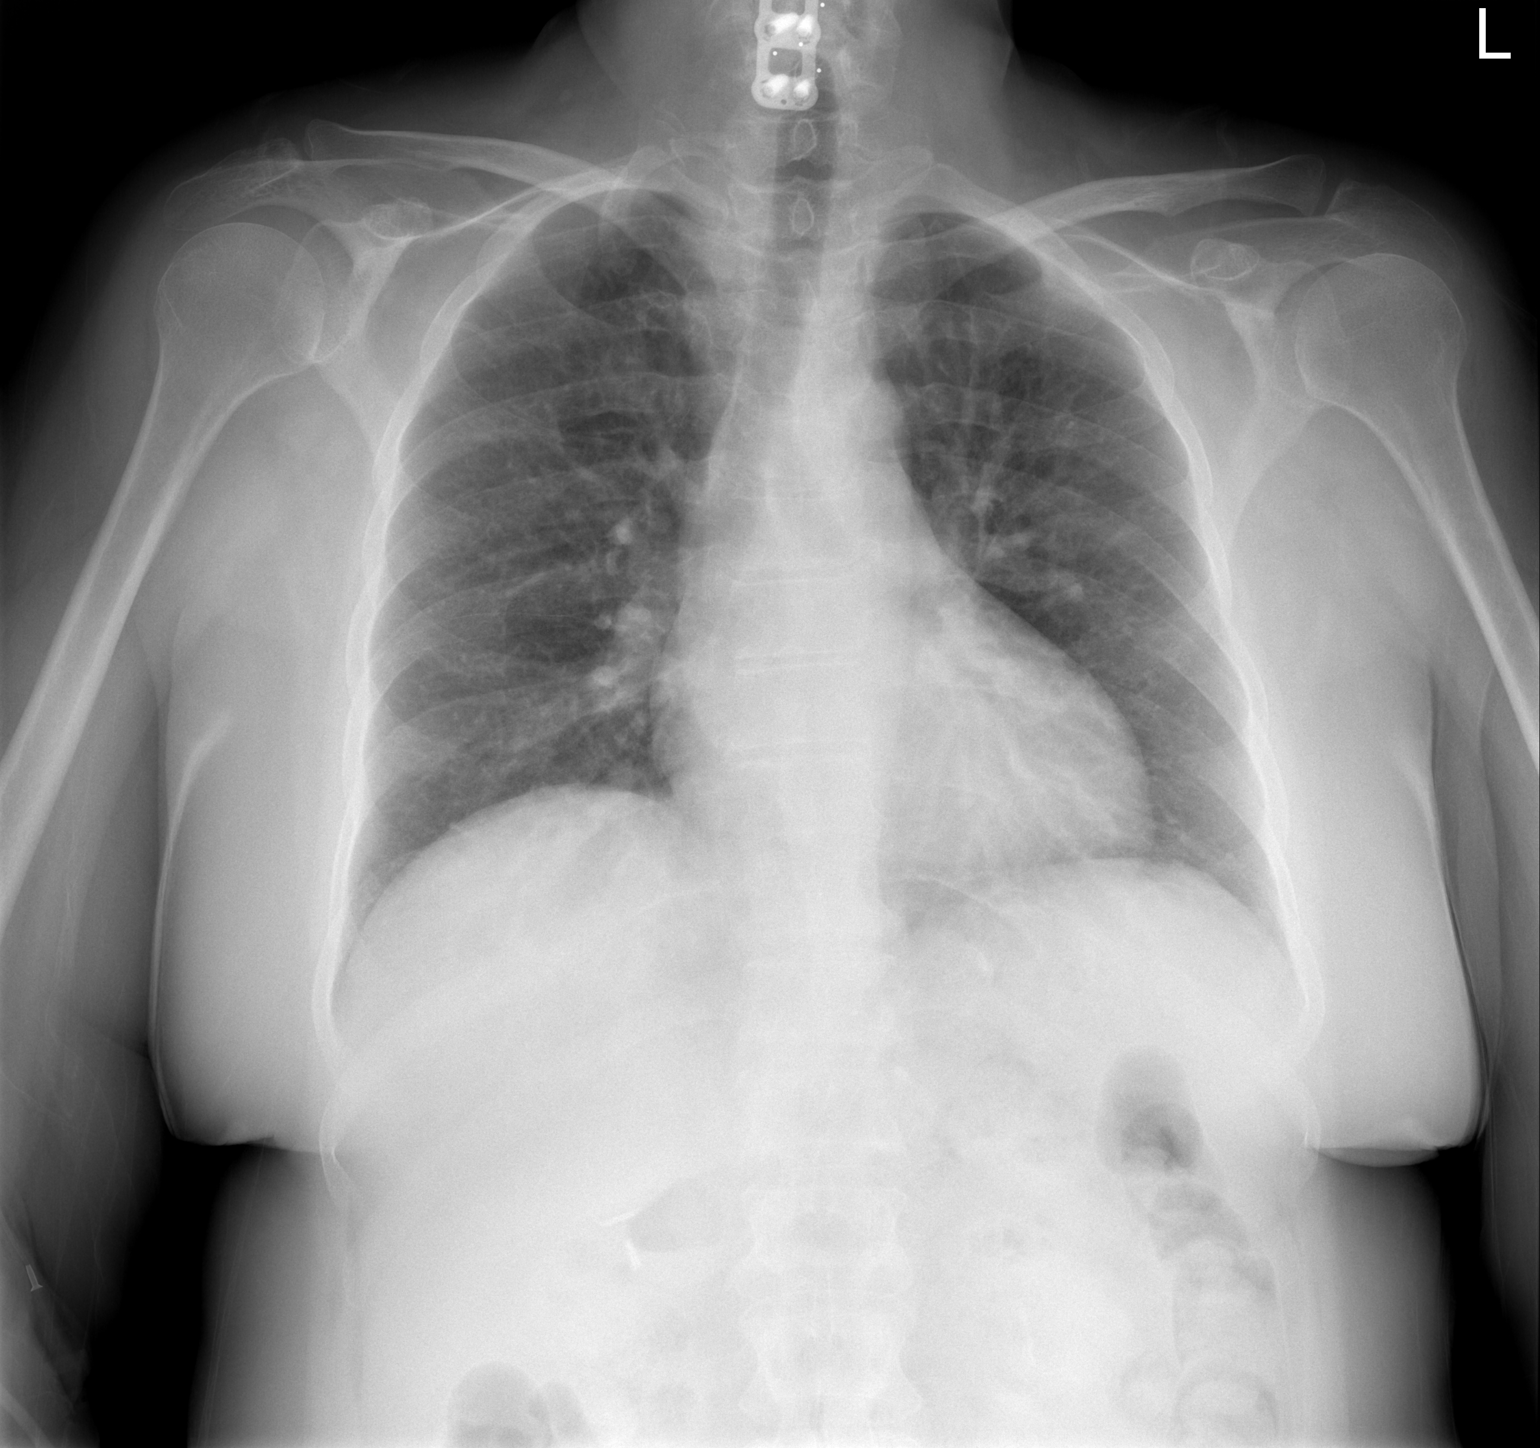

[w chest lat]
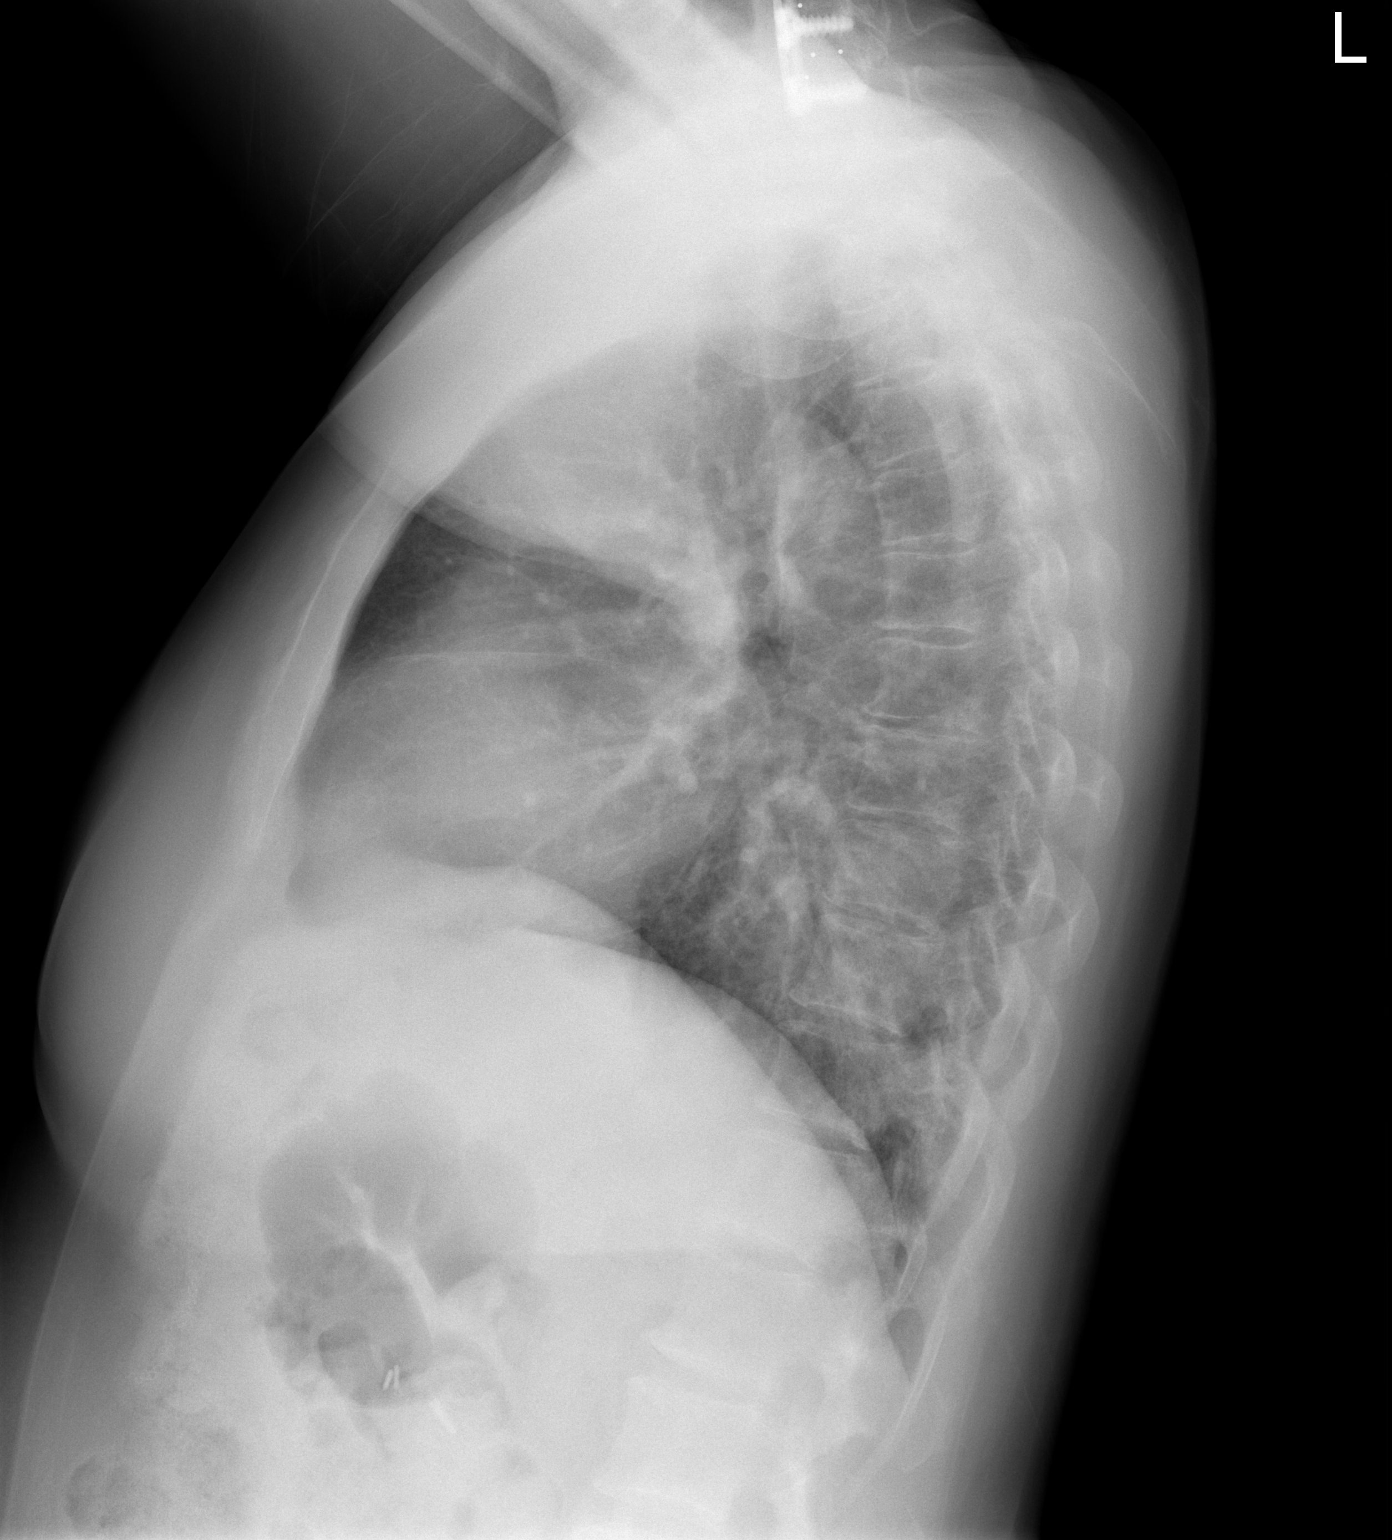

[2 of 2 positions shown; findings below may reference images not displayed]

FINDINGS: The heart size and mediastinal contours are within normal limits.
New opacity is seen in the posterior left lower lobe on the lateral
view, likely in the right lower lobe. This is suspicious for
pneumonia. No evidence of pleural effusion.
IMPRESSION: Posterior right lower lobe infiltrate, suspicious for pneumonia.
Recommend followup PA and lateral chest X-ray in 3-4 weeks to ensure
resolution.

## 2018-05-19 DIAGNOSIS — R5383 Other fatigue: Secondary | ICD-10-CM | POA: Diagnosis not present

## 2018-05-23 DIAGNOSIS — M25562 Pain in left knee: Secondary | ICD-10-CM | POA: Diagnosis not present

## 2018-05-23 DIAGNOSIS — M1712 Unilateral primary osteoarthritis, left knee: Secondary | ICD-10-CM | POA: Diagnosis not present

## 2018-05-23 DIAGNOSIS — I10 Essential (primary) hypertension: Secondary | ICD-10-CM | POA: Diagnosis not present

## 2018-06-07 DIAGNOSIS — G2581 Restless legs syndrome: Secondary | ICD-10-CM | POA: Diagnosis not present

## 2018-06-07 DIAGNOSIS — I10 Essential (primary) hypertension: Secondary | ICD-10-CM | POA: Diagnosis not present

## 2018-06-09 DIAGNOSIS — F319 Bipolar disorder, unspecified: Secondary | ICD-10-CM | POA: Diagnosis not present

## 2018-06-09 DIAGNOSIS — I10 Essential (primary) hypertension: Secondary | ICD-10-CM | POA: Diagnosis not present

## 2018-06-09 DIAGNOSIS — E785 Hyperlipidemia, unspecified: Secondary | ICD-10-CM | POA: Diagnosis not present

## 2018-06-09 DIAGNOSIS — E039 Hypothyroidism, unspecified: Secondary | ICD-10-CM | POA: Diagnosis not present

## 2018-06-09 DIAGNOSIS — R5383 Other fatigue: Secondary | ICD-10-CM | POA: Diagnosis not present

## 2018-06-12 ENCOUNTER — Other Ambulatory Visit: Payer: Self-pay | Admitting: Family Medicine

## 2018-06-19 ENCOUNTER — Ambulatory Visit: Payer: BLUE CROSS/BLUE SHIELD | Admitting: Nurse Practitioner

## 2018-07-04 ENCOUNTER — Other Ambulatory Visit: Payer: Self-pay | Admitting: Family Medicine

## 2018-07-04 DIAGNOSIS — H6982 Other specified disorders of Eustachian tube, left ear: Secondary | ICD-10-CM

## 2018-07-24 DIAGNOSIS — F1020 Alcohol dependence, uncomplicated: Secondary | ICD-10-CM | POA: Diagnosis not present

## 2018-07-24 DIAGNOSIS — F3170 Bipolar disorder, currently in remission, most recent episode unspecified: Secondary | ICD-10-CM | POA: Diagnosis not present

## 2018-07-24 DIAGNOSIS — G2581 Restless legs syndrome: Secondary | ICD-10-CM | POA: Diagnosis not present

## 2018-07-24 DIAGNOSIS — F4010 Social phobia, unspecified: Secondary | ICD-10-CM | POA: Diagnosis not present

## 2018-07-24 DIAGNOSIS — F408 Other phobic anxiety disorders: Secondary | ICD-10-CM | POA: Diagnosis not present

## 2018-08-24 DIAGNOSIS — R0600 Dyspnea, unspecified: Secondary | ICD-10-CM | POA: Diagnosis not present

## 2018-08-24 DIAGNOSIS — E079 Disorder of thyroid, unspecified: Secondary | ICD-10-CM | POA: Diagnosis not present

## 2018-08-24 DIAGNOSIS — Z881 Allergy status to other antibiotic agents status: Secondary | ICD-10-CM | POA: Diagnosis not present

## 2018-08-24 DIAGNOSIS — R0789 Other chest pain: Secondary | ICD-10-CM | POA: Diagnosis not present

## 2018-08-24 DIAGNOSIS — R079 Chest pain, unspecified: Secondary | ICD-10-CM | POA: Diagnosis not present

## 2018-08-24 DIAGNOSIS — I10 Essential (primary) hypertension: Secondary | ICD-10-CM | POA: Diagnosis not present

## 2018-08-24 DIAGNOSIS — Z880 Allergy status to penicillin: Secondary | ICD-10-CM | POA: Diagnosis not present

## 2018-08-24 DIAGNOSIS — Z888 Allergy status to other drugs, medicaments and biological substances status: Secondary | ICD-10-CM | POA: Diagnosis not present

## 2018-08-24 DIAGNOSIS — Z79899 Other long term (current) drug therapy: Secondary | ICD-10-CM | POA: Diagnosis not present

## 2018-08-24 DIAGNOSIS — K219 Gastro-esophageal reflux disease without esophagitis: Secondary | ICD-10-CM | POA: Diagnosis not present

## 2018-08-31 DIAGNOSIS — G4733 Obstructive sleep apnea (adult) (pediatric): Secondary | ICD-10-CM | POA: Diagnosis not present

## 2018-09-09 ENCOUNTER — Other Ambulatory Visit: Payer: Self-pay | Admitting: Family Medicine

## 2018-09-14 DIAGNOSIS — G4733 Obstructive sleep apnea (adult) (pediatric): Secondary | ICD-10-CM | POA: Diagnosis not present

## 2018-09-18 DIAGNOSIS — F408 Other phobic anxiety disorders: Secondary | ICD-10-CM | POA: Diagnosis not present

## 2018-09-18 DIAGNOSIS — F4010 Social phobia, unspecified: Secondary | ICD-10-CM | POA: Diagnosis not present

## 2018-09-18 DIAGNOSIS — F1020 Alcohol dependence, uncomplicated: Secondary | ICD-10-CM | POA: Diagnosis not present

## 2018-09-18 DIAGNOSIS — F3170 Bipolar disorder, currently in remission, most recent episode unspecified: Secondary | ICD-10-CM | POA: Diagnosis not present

## 2018-09-18 DIAGNOSIS — Z79899 Other long term (current) drug therapy: Secondary | ICD-10-CM | POA: Diagnosis not present

## 2018-09-25 DIAGNOSIS — Z1159 Encounter for screening for other viral diseases: Secondary | ICD-10-CM | POA: Diagnosis not present

## 2018-10-07 IMAGING — DX DG KNEE COMPLETE 4+V*L*
4 series · 4 of 4 positions shown · non-contrast
Comparison: None.

CLINICAL DATA: Medial left knee pain for 4 months.

EXAM:
LEFT KNEE - COMPLETE 4+ VIEW

[knee lat]
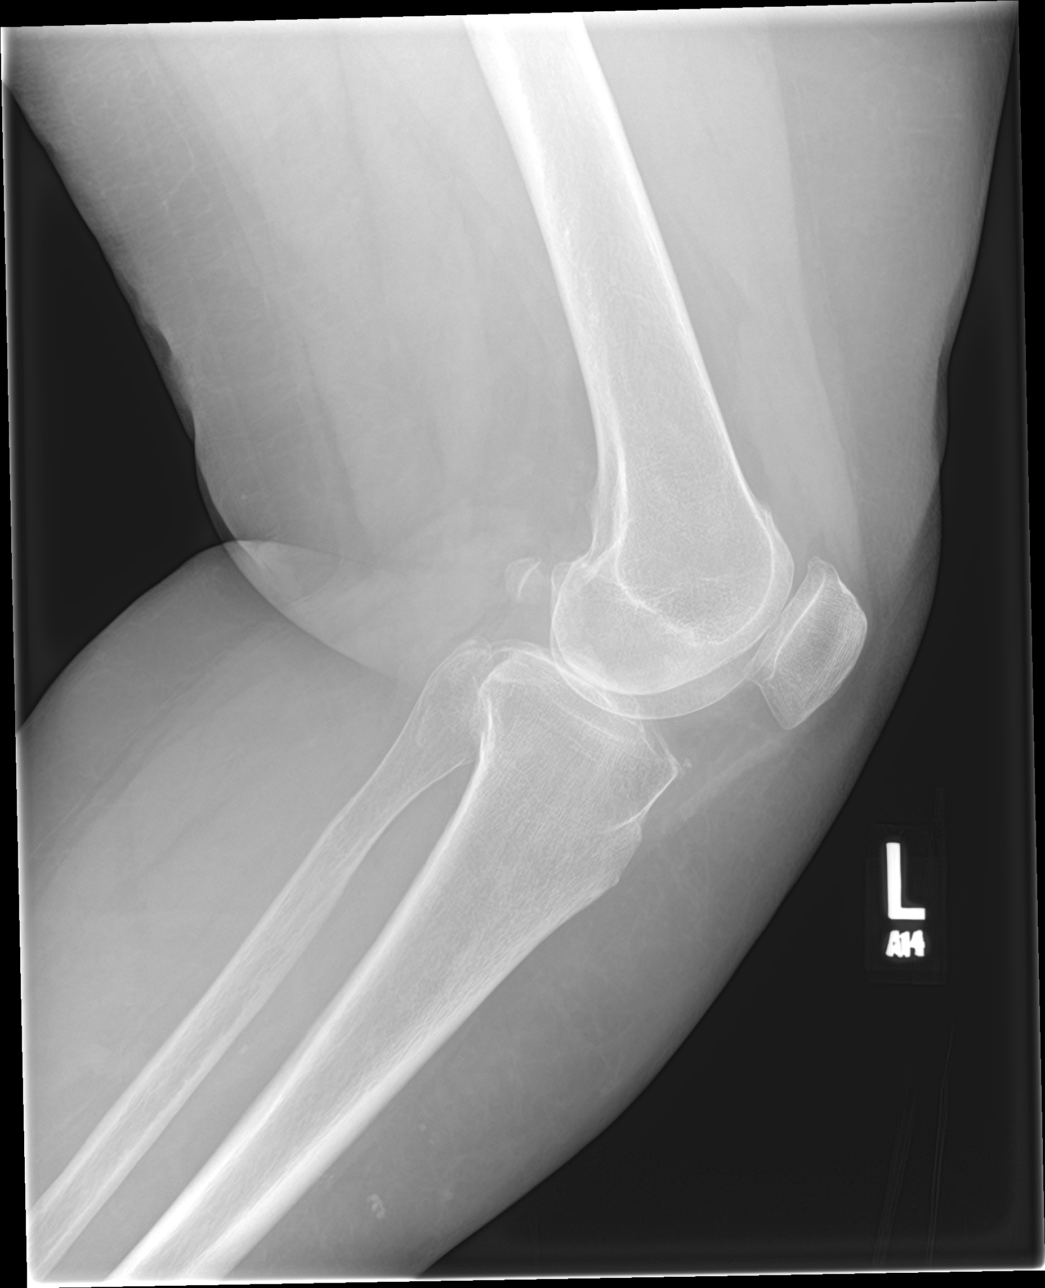

[knee obl (1 of 2)]
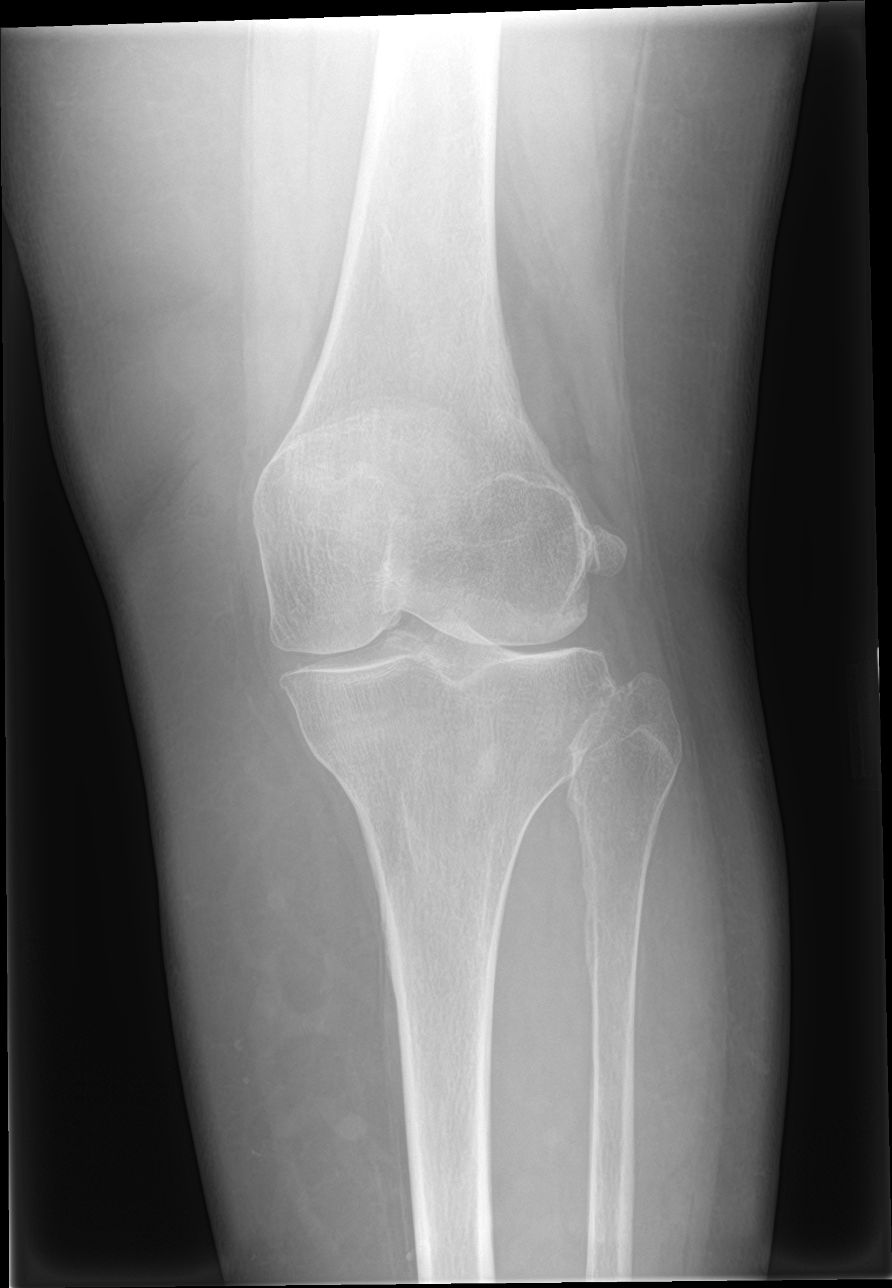

[knee obl (2 of 2)]
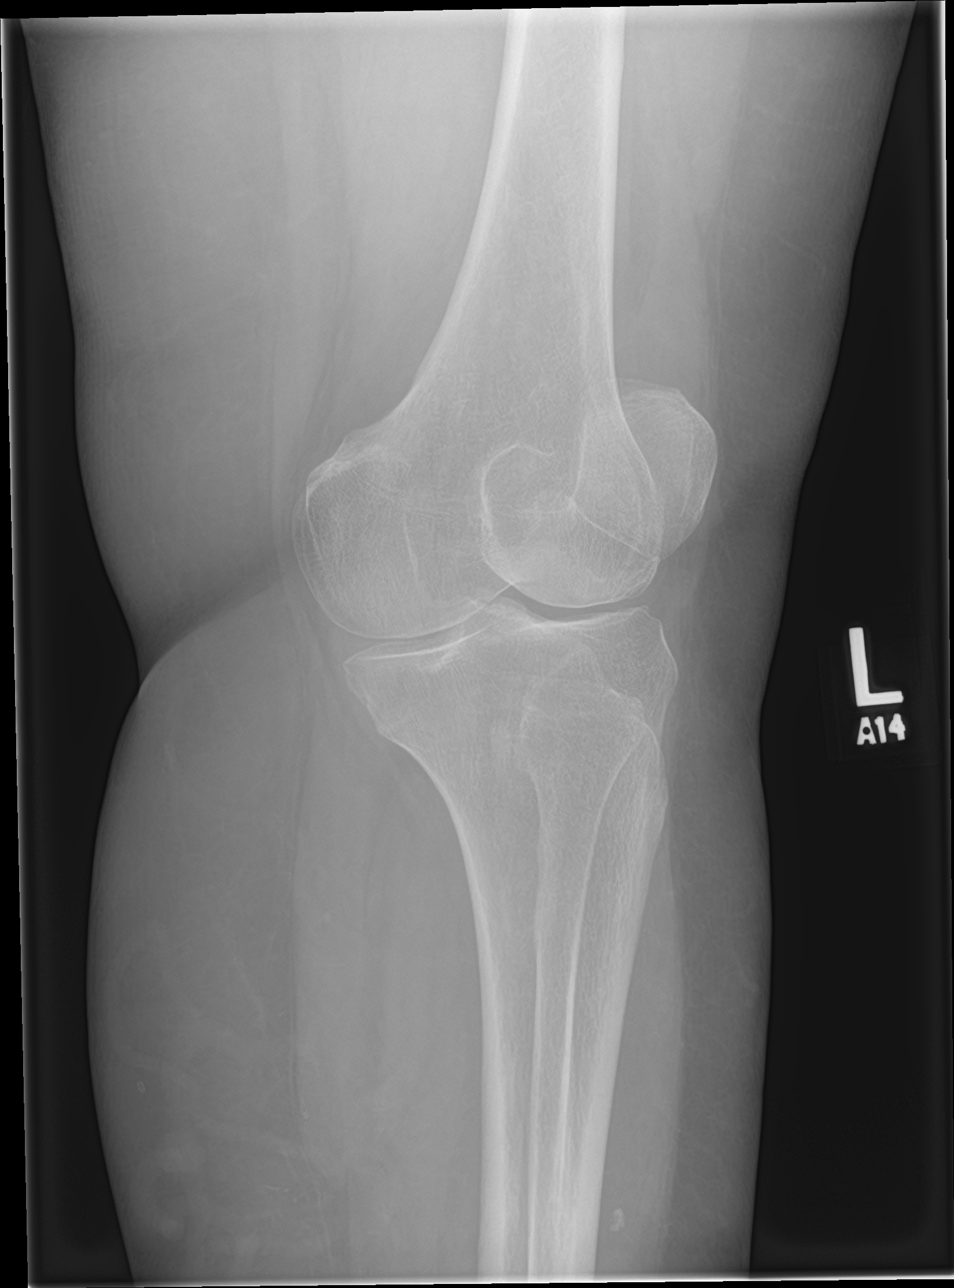

[knee ap]
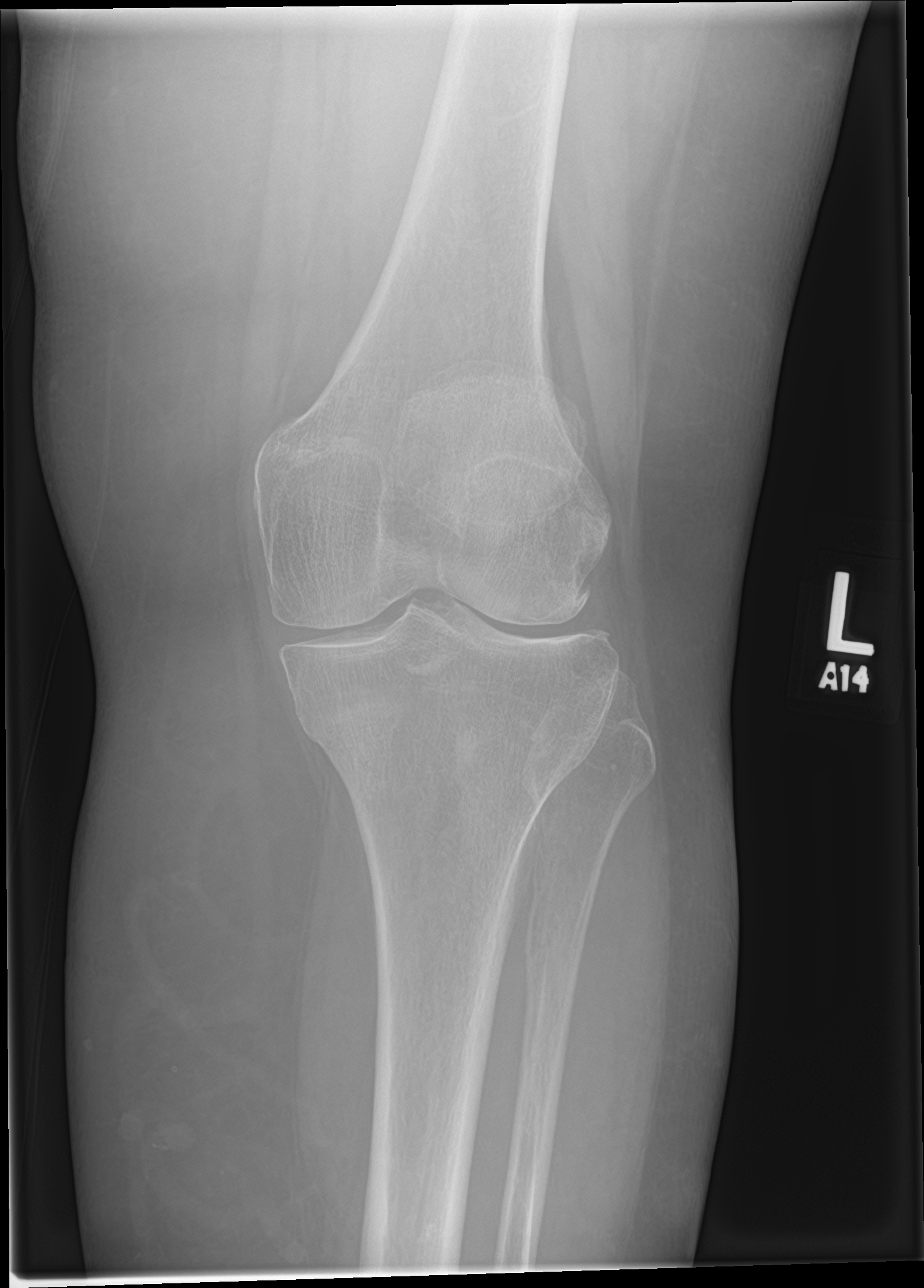

[4 of 4 positions shown; findings below may reference images not displayed]

FINDINGS: No evidence of fracture or dislocation. Small knee joint effusion
noted. Mild lateral compartment degenerative spurring is seen
without joint space narrowing. No other osseous abnormality
identified.
IMPRESSION: Small knee joint effusion.

Mild degenerative spurring of lateral compartment, without joint
space narrowing.

## 2018-10-15 DIAGNOSIS — G4733 Obstructive sleep apnea (adult) (pediatric): Secondary | ICD-10-CM | POA: Diagnosis not present

## 2018-10-16 DIAGNOSIS — F408 Other phobic anxiety disorders: Secondary | ICD-10-CM | POA: Diagnosis not present

## 2018-10-16 DIAGNOSIS — F4010 Social phobia, unspecified: Secondary | ICD-10-CM | POA: Diagnosis not present

## 2018-10-16 DIAGNOSIS — Z79899 Other long term (current) drug therapy: Secondary | ICD-10-CM | POA: Diagnosis not present

## 2018-10-16 DIAGNOSIS — F1020 Alcohol dependence, uncomplicated: Secondary | ICD-10-CM | POA: Diagnosis not present

## 2018-10-16 DIAGNOSIS — F3170 Bipolar disorder, currently in remission, most recent episode unspecified: Secondary | ICD-10-CM | POA: Diagnosis not present

## 2018-10-16 DIAGNOSIS — E215 Disorder of parathyroid gland, unspecified: Secondary | ICD-10-CM | POA: Diagnosis not present

## 2018-10-26 DIAGNOSIS — G2581 Restless legs syndrome: Secondary | ICD-10-CM | POA: Diagnosis not present

## 2018-10-26 DIAGNOSIS — I10 Essential (primary) hypertension: Secondary | ICD-10-CM | POA: Diagnosis not present

## 2018-10-26 DIAGNOSIS — G4733 Obstructive sleep apnea (adult) (pediatric): Secondary | ICD-10-CM | POA: Diagnosis not present

## 2018-11-06 DIAGNOSIS — R8290 Unspecified abnormal findings in urine: Secondary | ICD-10-CM | POA: Diagnosis not present

## 2018-11-06 DIAGNOSIS — E039 Hypothyroidism, unspecified: Secondary | ICD-10-CM | POA: Diagnosis not present

## 2018-11-06 DIAGNOSIS — R7309 Other abnormal glucose: Secondary | ICD-10-CM | POA: Diagnosis not present

## 2018-11-06 DIAGNOSIS — E230 Hypopituitarism: Secondary | ICD-10-CM | POA: Diagnosis not present

## 2018-11-06 DIAGNOSIS — R51 Headache: Secondary | ICD-10-CM | POA: Diagnosis not present

## 2018-11-06 DIAGNOSIS — I10 Essential (primary) hypertension: Secondary | ICD-10-CM | POA: Diagnosis not present

## 2018-11-14 DIAGNOSIS — T148XXA Other injury of unspecified body region, initial encounter: Secondary | ICD-10-CM | POA: Diagnosis not present

## 2018-11-14 DIAGNOSIS — G4733 Obstructive sleep apnea (adult) (pediatric): Secondary | ICD-10-CM | POA: Diagnosis not present

## 2018-11-16 DIAGNOSIS — T148XXA Other injury of unspecified body region, initial encounter: Secondary | ICD-10-CM | POA: Diagnosis not present

## 2018-11-17 DIAGNOSIS — Z903 Acquired absence of stomach [part of]: Secondary | ICD-10-CM | POA: Diagnosis not present

## 2018-11-17 DIAGNOSIS — K219 Gastro-esophageal reflux disease without esophagitis: Secondary | ICD-10-CM | POA: Diagnosis not present

## 2018-11-17 DIAGNOSIS — Z1211 Encounter for screening for malignant neoplasm of colon: Secondary | ICD-10-CM | POA: Diagnosis not present

## 2018-11-20 DIAGNOSIS — F1020 Alcohol dependence, uncomplicated: Secondary | ICD-10-CM | POA: Diagnosis not present

## 2018-11-20 DIAGNOSIS — F3170 Bipolar disorder, currently in remission, most recent episode unspecified: Secondary | ICD-10-CM | POA: Diagnosis not present

## 2018-11-20 DIAGNOSIS — F408 Other phobic anxiety disorders: Secondary | ICD-10-CM | POA: Diagnosis not present

## 2018-11-20 DIAGNOSIS — F4010 Social phobia, unspecified: Secondary | ICD-10-CM | POA: Diagnosis not present

## 2018-11-23 DIAGNOSIS — G4733 Obstructive sleep apnea (adult) (pediatric): Secondary | ICD-10-CM | POA: Diagnosis not present

## 2018-11-23 DIAGNOSIS — G2581 Restless legs syndrome: Secondary | ICD-10-CM | POA: Diagnosis not present

## 2018-11-24 DIAGNOSIS — R51 Headache: Secondary | ICD-10-CM | POA: Diagnosis not present

## 2018-11-27 DIAGNOSIS — T148XXA Other injury of unspecified body region, initial encounter: Secondary | ICD-10-CM | POA: Diagnosis not present

## 2018-11-27 DIAGNOSIS — I10 Essential (primary) hypertension: Secondary | ICD-10-CM | POA: Diagnosis not present

## 2018-11-27 DIAGNOSIS — I83813 Varicose veins of bilateral lower extremities with pain: Secondary | ICD-10-CM | POA: Diagnosis not present

## 2018-11-27 DIAGNOSIS — R7989 Other specified abnormal findings of blood chemistry: Secondary | ICD-10-CM | POA: Diagnosis not present

## 2018-11-27 DIAGNOSIS — E230 Hypopituitarism: Secondary | ICD-10-CM | POA: Diagnosis not present

## 2018-11-28 DIAGNOSIS — F319 Bipolar disorder, unspecified: Secondary | ICD-10-CM | POA: Diagnosis not present

## 2018-11-28 DIAGNOSIS — G4440 Drug-induced headache, not elsewhere classified, not intractable: Secondary | ICD-10-CM | POA: Diagnosis not present

## 2018-11-28 DIAGNOSIS — G43009 Migraine without aura, not intractable, without status migrainosus: Secondary | ICD-10-CM | POA: Diagnosis not present

## 2018-11-28 DIAGNOSIS — I10 Essential (primary) hypertension: Secondary | ICD-10-CM | POA: Diagnosis not present

## 2018-11-28 DIAGNOSIS — G43709 Chronic migraine without aura, not intractable, without status migrainosus: Secondary | ICD-10-CM | POA: Diagnosis not present

## 2018-11-30 DIAGNOSIS — Z1231 Encounter for screening mammogram for malignant neoplasm of breast: Secondary | ICD-10-CM | POA: Diagnosis not present

## 2018-12-02 DIAGNOSIS — R51 Headache: Secondary | ICD-10-CM | POA: Diagnosis not present

## 2018-12-15 DIAGNOSIS — G4733 Obstructive sleep apnea (adult) (pediatric): Secondary | ICD-10-CM | POA: Diagnosis not present

## 2018-12-19 DIAGNOSIS — F4010 Social phobia, unspecified: Secondary | ICD-10-CM | POA: Diagnosis not present

## 2018-12-19 DIAGNOSIS — F3170 Bipolar disorder, currently in remission, most recent episode unspecified: Secondary | ICD-10-CM | POA: Diagnosis not present

## 2018-12-19 DIAGNOSIS — F408 Other phobic anxiety disorders: Secondary | ICD-10-CM | POA: Diagnosis not present

## 2018-12-19 DIAGNOSIS — F1020 Alcohol dependence, uncomplicated: Secondary | ICD-10-CM | POA: Diagnosis not present

## 2018-12-22 DIAGNOSIS — B079 Viral wart, unspecified: Secondary | ICD-10-CM | POA: Diagnosis not present

## 2018-12-22 DIAGNOSIS — Z808 Family history of malignant neoplasm of other organs or systems: Secondary | ICD-10-CM | POA: Diagnosis not present

## 2018-12-22 DIAGNOSIS — I10 Essential (primary) hypertension: Secondary | ICD-10-CM | POA: Diagnosis not present

## 2018-12-22 DIAGNOSIS — L821 Other seborrheic keratosis: Secondary | ICD-10-CM | POA: Diagnosis not present

## 2018-12-22 DIAGNOSIS — G4733 Obstructive sleep apnea (adult) (pediatric): Secondary | ICD-10-CM | POA: Diagnosis not present

## 2018-12-22 DIAGNOSIS — D2270 Melanocytic nevi of unspecified lower limb, including hip: Secondary | ICD-10-CM | POA: Diagnosis not present

## 2018-12-27 DIAGNOSIS — Z124 Encounter for screening for malignant neoplasm of cervix: Secondary | ICD-10-CM | POA: Diagnosis not present

## 2018-12-27 DIAGNOSIS — Z01419 Encounter for gynecological examination (general) (routine) without abnormal findings: Secondary | ICD-10-CM | POA: Diagnosis not present

## 2018-12-27 DIAGNOSIS — Z6832 Body mass index (BMI) 32.0-32.9, adult: Secondary | ICD-10-CM | POA: Diagnosis not present

## 2018-12-27 DIAGNOSIS — I10 Essential (primary) hypertension: Secondary | ICD-10-CM | POA: Diagnosis not present

## 2018-12-27 DIAGNOSIS — Z1151 Encounter for screening for human papillomavirus (HPV): Secondary | ICD-10-CM | POA: Diagnosis not present

## 2019-01-16 DIAGNOSIS — F4010 Social phobia, unspecified: Secondary | ICD-10-CM | POA: Diagnosis not present

## 2019-01-16 DIAGNOSIS — F1020 Alcohol dependence, uncomplicated: Secondary | ICD-10-CM | POA: Diagnosis not present

## 2019-01-16 DIAGNOSIS — F408 Other phobic anxiety disorders: Secondary | ICD-10-CM | POA: Diagnosis not present

## 2019-01-16 DIAGNOSIS — F3170 Bipolar disorder, currently in remission, most recent episode unspecified: Secondary | ICD-10-CM | POA: Diagnosis not present

## 2019-02-08 DIAGNOSIS — Z23 Encounter for immunization: Secondary | ICD-10-CM | POA: Diagnosis not present

## 2019-03-19 DIAGNOSIS — G4733 Obstructive sleep apnea (adult) (pediatric): Secondary | ICD-10-CM | POA: Diagnosis not present

## 2019-03-26 DIAGNOSIS — G4733 Obstructive sleep apnea (adult) (pediatric): Secondary | ICD-10-CM | POA: Diagnosis not present

## 2019-03-26 DIAGNOSIS — F319 Bipolar disorder, unspecified: Secondary | ICD-10-CM | POA: Diagnosis not present

## 2019-03-26 DIAGNOSIS — I10 Essential (primary) hypertension: Secondary | ICD-10-CM | POA: Diagnosis not present

## 2019-03-26 DIAGNOSIS — G43709 Chronic migraine without aura, not intractable, without status migrainosus: Secondary | ICD-10-CM | POA: Diagnosis not present

## 2019-03-26 DIAGNOSIS — G43009 Migraine without aura, not intractable, without status migrainosus: Secondary | ICD-10-CM | POA: Diagnosis not present

## 2019-04-12 DIAGNOSIS — F4010 Social phobia, unspecified: Secondary | ICD-10-CM | POA: Diagnosis not present

## 2019-04-12 DIAGNOSIS — F408 Other phobic anxiety disorders: Secondary | ICD-10-CM | POA: Diagnosis not present

## 2019-04-12 DIAGNOSIS — F1020 Alcohol dependence, uncomplicated: Secondary | ICD-10-CM | POA: Diagnosis not present

## 2019-04-12 DIAGNOSIS — F3170 Bipolar disorder, currently in remission, most recent episode unspecified: Secondary | ICD-10-CM | POA: Diagnosis not present

## 2020-02-25 ENCOUNTER — Other Ambulatory Visit: Payer: Self-pay | Admitting: Otolaryngology

## 2020-03-15 ENCOUNTER — Other Ambulatory Visit (HOSPITAL_COMMUNITY): Payer: Medicare Other | Attending: Otolaryngology

## 2020-03-19 ENCOUNTER — Ambulatory Visit (HOSPITAL_BASED_OUTPATIENT_CLINIC_OR_DEPARTMENT_OTHER): Admission: RE | Admit: 2020-03-19 | Payer: Medicare Other | Source: Home / Self Care | Admitting: Otolaryngology

## 2020-03-19 ENCOUNTER — Encounter (HOSPITAL_BASED_OUTPATIENT_CLINIC_OR_DEPARTMENT_OTHER): Admission: RE | Payer: Self-pay | Source: Home / Self Care

## 2020-03-19 SURGERY — DRUG INDUCED SLEEP ENDOSCOPY
Anesthesia: General

## 2021-01-08 DEATH — deceased
